# Patient Record
Sex: Female | Born: 1990 | Race: Black or African American | Hispanic: No | Marital: Single | Smoking: Never smoker
Health system: Southern US, Community
[De-identification: ages and names within clinical notes are randomized; demographics above are authoritative.]

## PROBLEM LIST (undated history)

## (undated) DIAGNOSIS — H539 Unspecified visual disturbance: Secondary | ICD-10-CM

## (undated) DIAGNOSIS — A419 Sepsis, unspecified organism: Secondary | ICD-10-CM

## (undated) DIAGNOSIS — R569 Unspecified convulsions: Secondary | ICD-10-CM

## (undated) DIAGNOSIS — G809 Cerebral palsy, unspecified: Secondary | ICD-10-CM

## (undated) HISTORY — DX: Unspecified visual disturbance: H53.9

## (undated) HISTORY — DX: Unspecified convulsions: R56.9

## (undated) HISTORY — PX: REMOVAL OF GASTROSTOMY TUBE: SHX6058

---
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
## 2008-08-17 ENCOUNTER — Ambulatory Visit (HOSPITAL_COMMUNITY): Admission: RE | Admit: 2008-08-17 | Discharge: 2008-08-17 | Payer: Self-pay | Admitting: Pediatrics

## 2008-12-19 ENCOUNTER — Ambulatory Visit: Admission: RE | Admit: 2008-12-19 | Discharge: 2008-12-19 | Payer: Self-pay | Admitting: Diagnostic Radiology

## 2009-10-13 ENCOUNTER — Ambulatory Visit (HOSPITAL_COMMUNITY): Admission: RE | Admit: 2009-10-13 | Discharge: 2009-10-13 | Payer: Self-pay | Admitting: Interventional Radiology

## 2009-12-03 ENCOUNTER — Emergency Department (HOSPITAL_BASED_OUTPATIENT_CLINIC_OR_DEPARTMENT_OTHER): Admission: EM | Admit: 2009-12-03 | Discharge: 2009-12-03 | Payer: Self-pay | Admitting: Emergency Medicine

## 2009-12-03 ENCOUNTER — Ambulatory Visit: Payer: Self-pay | Admitting: Diagnostic Radiology

## 2009-12-07 ENCOUNTER — Emergency Department (HOSPITAL_BASED_OUTPATIENT_CLINIC_OR_DEPARTMENT_OTHER): Admission: EM | Admit: 2009-12-07 | Discharge: 2009-12-07 | Payer: Self-pay | Admitting: Emergency Medicine

## 2009-12-07 ENCOUNTER — Ambulatory Visit: Payer: Self-pay | Admitting: Radiology

## 2010-07-19 ENCOUNTER — Other Ambulatory Visit (HOSPITAL_COMMUNITY): Payer: Self-pay | Admitting: Emergency Medicine

## 2010-07-19 ENCOUNTER — Ambulatory Visit (HOSPITAL_COMMUNITY)
Admission: RE | Admit: 2010-07-19 | Discharge: 2010-07-19 | Disposition: A | Discharge status: Home / Self Care | Payer: Managed Care, Other (non HMO) | Source: Ambulatory Visit | Attending: Emergency Medicine | Admitting: Emergency Medicine

## 2010-07-19 DIAGNOSIS — Z438 Encounter for attention to other artificial openings: Secondary | ICD-10-CM | POA: Insufficient documentation

## 2010-07-19 DIAGNOSIS — Z931 Gastrostomy status: Secondary | ICD-10-CM

## 2010-07-19 MED ORDER — IOHEXOL 300 MG/ML  SOLN
50.0000 mL | Freq: Once | INTRAMUSCULAR | Status: AC | PRN
  Administered 2010-07-19: 10 mL via INTRAVENOUS

## 2010-08-03 NOTE — Op Note (Signed)
[place]. [facility]  Patient:    DAWT REEB Visit Number: [id number] MRN: [id number]          Service Type: END Location: ENDO Attending Physician:  Fayette Pho Damodar Proc. Date: 07/03/98 Admit Date:  07/03/1998                             Operative Report  PREOPERATIVE DIAGNOSIS:  Nonfunctioning gastrostomy button.  POSTOPERATIVE DIAGNOSIS:  Nonfunctioning gastrostomy button.  OPERATION PERFORMED: 1. Removal of nonfunctioning gastrostomy button. 2. Placement of new Bard #18 French 3.4 cm stem button.  SURGEON:  Prabhakar D. Levie Heritage, M.D.  ASSISTANT:  Nurse.  ANESTHESIA:  Nurse.  DESCRIPTION OF PROCEDURE:  Under satisfactory chloral hydrate sedation and topical EMLA cream anesthesia, the gastrostomy site was thoroughly prepped and draped in the usual manner.  The previously placed nonfunctioning gastrostomy button was removed by manipulation.  A new Bard #18, 3.4 cm stem button was placed by appropriate maneuvers.  The button was irrigated with 50 cc of saline.  There was no unusual leakage noted.  The gastrostomy site was cleansed dressed, appropriate instructions were given to the parent regarding the care of the button.  The patient was discharged to be followed as an outpatient. Attending Physician:  Carlos Levering DD:  07/03/98 TD:  07/04/98 Job: [id number]

## 2010-08-03 NOTE — Discharge Summary (Signed)
[place]. [facility]  Patient:    Amber Curry, Amber Curry Visit Number: [id number] MRN: [id number]          Service Type: PED Location: PEDS [phone] Attending Physician:  Edmonia Lynch Dictated by:   Vear Clock, M.D. Admit Date:  12/10/2000 Discharge Date: 12/15/2000                             Discharge Summary  DISCHARGE DIAGNOSES: 1. Episodes of respiratory distress secondary to poor pulmonary function. 2. Severe spastic quadriplegia cerebral palsy. 3. Severe mental retardation. 4. Cortical blindness. 5. Seizure disorder.  DISCHARGE MEDICATIONS: 1. Phenobarbital 100 mg p.o. q.h.s. 2. Tegretol as per home dose. 3. Rondec-DM 5 cc per G-tube q.6h. p.r.n. cough and congestion.  FOLLOWUP:  The patient was to follow up with Dr. Dario Guardian on approximately October 2 or 3, family is to call to set up an appointment.  PROCEDURES/DIAGNOSTIC STUDIES:  None.  CONSULTS:  Neurologic by Dr. Sharene Skeans.  PLAN:  Recommend phenobarbital and carbamazepine levels to be obtained for further evaluation.  ADMISSION HISTORY AND PHYSICAL:  This is one of multiple admissions for this patient, a 20 year old black female admitted September 25 for respiratory difficulties, characterized by cough, low-grade fever, vomiting, diarrhea and suspected viral illness.  The patient was treated with two doses of IM Rocephin and subsequently Augmentin in the hospital.  HOSPITAL COURSE:  #1 - RESPIRATORY DISTRESS:  The patient has been maintained CO monitor and was given Augmentin for her viral URI as well as Rondec and Baclofen to decrease secretions.  #2 - NEUROLOGIC:  Evaluated patient for recheck of her antiepileptics, levels were within normal limits.  #3 - GYNECOLOGIC:  The patient was having painful menses, was due for her Depo shot on the day prior to discharge and a gynecologic exam with Dr. Farrel Gobble at that time.  Mom notes that menses has triggered seizures in  the past.  Mother encouraged to discuss this further with Dr. Sharene Skeans and Dr. Farrel Gobble.  At the end, the patient was continued intermittently on PediaSure versus her usual tube feeds and eventually was able to progress to regular tube feeds again. Mother also encouraged to give patient free water boluses.  LABORATORY DATA:  A CBC on September 25 shows WBC 10.9, hemoglobin 16.4, hematocrit 48.5, platelets 363.  Chemistries on September 25 show sodium 138, potassium 3.8, chloride 104, CO2 25, glucose 85, BUN 4, creatinine 0.5, calcium 8.7, total protein 7.9, albumin 3.8, AST 55, ALT 38, alkaline phosphatase 153, total bili 1.2.  Hemoglobin and carbamazepine levels on admission and on September 30, 21.7 and 20.4 for phenobarb and 9.3 and 9.6 for carbamazepine.  The patient began to improve and is subsequently discharged without further incident.  Follow as previously noted. Dictated by:   Vear Clock, M.D. Attending Physician:  Edmonia Lynch DD:  12/29/00 TD:  12/29/00 Job: [id number]

## 2010-08-03 NOTE — Consult Note (Signed)
[place]. [facility]  Patient:    [name], [name] Visit Number: [id number] MRN: [id number]          Service Type: PED Location: PEDS [phone] Attending Physician:  Edmonia Lynch Dictated by:   Genene Churn. Love, M.D. Admit Date:  12/10/2000                            Consultation Report  REFERRING PHYSICIAN:  Duard Brady, M.D.  PATIENT ADDRESS:  [address], [place], [place], [address].  DATE OF BIRTH:  02/07/1991  REASON FOR CONSULTATION:  This is one of multiple admissions for Amber Curry, a 20 year old black female admitted December 10, 2000 for respiratory difficulties characterized by cough, low-grade fever, vomiting, diarrhea, and suspected viral illness.  This was initially treated with two doses of IM Rocephin and subsequently Augmentin in the hospital.  I am asked to see her for evaluation of recurrent seizures.  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 She has had bilateral hip dislocation repair by Dr. Waylan Boga.  CT scans of the brain have shown generalized atrophy and shunted hydrocephalus.  EEG has shown diffuse background slowing and three independent seizure foci without electrographic seizures present.  She has been on phenobarbital 60 mg q.p.m., Tegretol 100 mg one-half teaspoonful in the morning and one-half at noon and two teaspoonfuls at night time, and Depo-Provera every three months. She has been on Baclofen 10 mg one-half in the morning, one-half at midday, and one at nighttime.  She has had frequent seizures according to her mom; at school it has been reported that she has had 10 seizures in the last 10 days. She has least four seizures per month, or at least one per week according to her mom.  These are characterized by abduction of her arms and a blank stare and trembling  movements of her arms, usually lasting anywhere from 30 to 60 seconds.  She was admitted on December 10, 2000 for respiratory distress, placed on Robitussin-DM, albuterol nebulizer, Neo-Synephrine nasal spray 0.25%, Neosporin, Augmentin, and had been on Rocephin and the other medicines as listed above as an outpatient.  She had witnessed seizure x 3 on December 14, 2000 and one early in the morning December 15, 2000 - each with arm abduction at the shoulders and trembling, lasting 30 to 60 seconds.  Levels of phenobarbital 21.7 on September 25 and carbamazepine 9.3 on September 25.  PHYSICAL EXAMINATION:  GENERAL:  Revealed a poorly-developed black female.  VITAL SIGNS:  With a large adult pressure cuff, blood pressure in the right and left arm of 80/60 with a heart rate of 104.  Head circumference was 47.5 cm.  Temperature was 98.5 axillary.  O2 saturations were 100% on room air.  NEUROLOGIC:  Mental status revealed she could not see, she could not speak, she could not follow any commands.  She lay in bed with spastic quadriplegia. She would open her eyes.  She had a film over her right eye.  She did not blink to scare on the left.  The left pupil was reactive from 3 to 2.5.  The left disk was seen and flat.  The right disk was not seen and the right pupil was not well seen.  She had full extraocular movements with roving eye movements.  She had increased tone in  the upper and lower extremities.  She had gag reflexes.  She had clonus at both ankles and both hands, increased deep tendon reflexes, plantars with toes that were flexed, and wrists that were extended.  She did move all extremities.  IMPRESSION: 1. Minor motor seizure and generalized grand mal seizure, code 345.10. 2. Myoclonus, code 333.2. 3. Spastic quadriplegia, code 343.2. 4. Mental retardation, code 318.1. 5. 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 6. Cortical blindness, code 377.75.  PLAN:  The plan at this time is to  recommend phenobarbital and carbamazepine levels to be obtained for further evaluation.Dictated by:   Genene Churn. Love, M.D.  Attending Physician:  Edmonia Lynch DD:  12/15/00 TD:  12/15/00 Job: [id number]

## 2011-05-20 ENCOUNTER — Other Ambulatory Visit (HOSPITAL_COMMUNITY): Payer: Self-pay | Admitting: Diagnostic Radiology

## 2011-05-20 ENCOUNTER — Ambulatory Visit (HOSPITAL_COMMUNITY)
Admission: RE | Admit: 2011-05-20 | Discharge: 2011-05-20 | Disposition: A | Discharge status: Home / Self Care | Payer: Managed Care, Other (non HMO) | Source: Ambulatory Visit | Attending: Diagnostic Radiology | Admitting: Diagnostic Radiology

## 2011-05-20 DIAGNOSIS — G809 Cerebral palsy, unspecified: Secondary | ICD-10-CM

## 2011-05-20 DIAGNOSIS — Z434 Encounter for attention to other artificial openings of digestive tract: Secondary | ICD-10-CM | POA: Insufficient documentation

## 2011-05-20 MED ORDER — IOHEXOL 300 MG/ML  SOLN
50.0000 mL | Freq: Once | INTRAMUSCULAR | Status: AC | PRN
  Administered 2011-05-20: 20 mL

## 2011-05-20 NOTE — Procedures (Signed)
Successful exchange of 24 [place] J-tube.  No immediate complication.

## 2011-05-21 ENCOUNTER — Telehealth (HOSPITAL_COMMUNITY): Payer: Self-pay | Admitting: Radiology

## 2011-05-23 ENCOUNTER — Telehealth (HOSPITAL_COMMUNITY): Payer: Self-pay | Admitting: *Deleted

## 2012-03-31 ENCOUNTER — Other Ambulatory Visit (HOSPITAL_COMMUNITY): Payer: Self-pay | Admitting: Interventional Radiology

## 2012-03-31 DIAGNOSIS — F79 Unspecified intellectual disabilities: Secondary | ICD-10-CM

## 2012-04-01 ENCOUNTER — Ambulatory Visit (HOSPITAL_COMMUNITY)
Admission: RE | Admit: 2012-04-01 | Discharge: 2012-04-01 | Disposition: A | Discharge status: Home / Self Care | Payer: Managed Care, Other (non HMO) | Source: Ambulatory Visit | Attending: Interventional Radiology | Admitting: Interventional Radiology

## 2012-04-01 DIAGNOSIS — F79 Unspecified intellectual disabilities: Secondary | ICD-10-CM | POA: Insufficient documentation

## 2012-04-01 DIAGNOSIS — K9413 Enterostomy malfunction: Secondary | ICD-10-CM | POA: Insufficient documentation

## 2012-04-01 DIAGNOSIS — K9403 Colostomy malfunction: Secondary | ICD-10-CM | POA: Insufficient documentation

## 2012-04-01 MED ORDER — IOHEXOL 300 MG/ML  SOLN
50.0000 mL | Freq: Once | INTRAMUSCULAR | Status: AC | PRN
  Administered 2012-04-01: 1 mL

## 2012-04-01 NOTE — Procedures (Signed)
Successful exchange of 24 Fr GJ tube.  No immediate complication.

## 2012-07-17 ENCOUNTER — Other Ambulatory Visit: Payer: Self-pay

## 2012-07-17 DIAGNOSIS — G40319 Generalized idiopathic epilepsy and epileptic syndromes, intractable, without status epilepticus: Secondary | ICD-10-CM

## 2012-07-17 MED ORDER — PHENOBARBITAL 20 MG/5ML PO ELIX: ORAL_SOLUTION | ORAL | Status: DC

## 2012-09-07 ENCOUNTER — Ambulatory Visit (HOSPITAL_COMMUNITY)
Admission: RE | Admit: 2012-09-07 | Discharge: 2012-09-07 | Disposition: A | Discharge status: Home / Self Care | Payer: Managed Care, Other (non HMO) | Source: Ambulatory Visit | Attending: Interventional Radiology | Admitting: Interventional Radiology

## 2012-09-07 ENCOUNTER — Other Ambulatory Visit (HOSPITAL_COMMUNITY): Payer: Self-pay | Admitting: Interventional Radiology

## 2012-09-07 DIAGNOSIS — K9413 Enterostomy malfunction: Secondary | ICD-10-CM | POA: Insufficient documentation

## 2012-09-07 DIAGNOSIS — G8250 Quadriplegia, unspecified: Secondary | ICD-10-CM

## 2012-09-07 DIAGNOSIS — Y833 Surgical operation with formation of external stoma as the cause of abnormal reaction of the patient, or of later complication, without mention of misadventure at the time of the procedure: Secondary | ICD-10-CM | POA: Insufficient documentation

## 2012-09-07 DIAGNOSIS — K9403 Colostomy malfunction: Secondary | ICD-10-CM | POA: Insufficient documentation

## 2012-09-07 DIAGNOSIS — E441 Mild protein-calorie malnutrition: Secondary | ICD-10-CM | POA: Insufficient documentation

## 2012-09-07 MED ORDER — IOHEXOL 300 MG/ML  SOLN
50.0000 mL | Freq: Once | INTRAMUSCULAR | Status: AC | PRN
  Administered 2012-09-07: 15 mL via INTRAVENOUS

## 2012-09-07 NOTE — Procedures (Signed)
Successful 72fr J TUBE EXCHG NO COMP STABLE READY FOR USE

## 2012-10-14 ENCOUNTER — Telehealth: Payer: Self-pay | Admitting: *Deleted

## 2012-10-14 DIAGNOSIS — G808 Other cerebral palsy: Secondary | ICD-10-CM

## 2012-10-14 MED ORDER — AMBULATORY NON FORMULARY MEDICATION: Status: DC

## 2012-10-14 NOTE — Telephone Encounter (Signed)
Signed prescription is on your desk

## 2013-01-24 ENCOUNTER — Other Ambulatory Visit: Payer: Self-pay | Admitting: Family

## 2013-01-24 DIAGNOSIS — G40319 Generalized idiopathic epilepsy and epileptic syndromes, intractable, without status epilepticus: Secondary | ICD-10-CM

## 2013-01-29 ENCOUNTER — Ambulatory Visit (INDEPENDENT_AMBULATORY_CARE_PROVIDER_SITE_OTHER): Payer: Managed Care, Other (non HMO) | Admitting: Family

## 2013-01-29 ENCOUNTER — Encounter: Payer: Self-pay | Admitting: Family

## 2013-01-29 VITALS — BP 108/78 | HR 84

## 2013-01-29 DIAGNOSIS — G40319 Generalized idiopathic epilepsy and epileptic syndromes, intractable, without status epilepticus: Secondary | ICD-10-CM

## 2013-01-29 DIAGNOSIS — Q02 Microcephaly: Secondary | ICD-10-CM

## 2013-01-29 DIAGNOSIS — F72 Severe intellectual disabilities: Secondary | ICD-10-CM

## 2013-01-29 DIAGNOSIS — H47619 Cortical blindness, unspecified side of brain: Secondary | ICD-10-CM

## 2013-01-29 DIAGNOSIS — G808 Other cerebral palsy: Secondary | ICD-10-CM

## 2013-01-29 DIAGNOSIS — Z79899 Other long term (current) drug therapy: Secondary | ICD-10-CM

## 2013-01-29 NOTE — Progress Notes (Signed)
Patient: Amber Curry MRN: [id number] Sex: female DOB: 1991-02-09  Provider: Elveria Rising, NP Location of Care: [facility]  Note type: Routine return visit  History of Present Illness: Referral Source: Dr. Rocky Morel History from: escort and [facility] chart Chief Complaint: Epilepsy/Cortical Blindness/Severe Mental Retardation  Amber Curry is a 22 y.o. 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  She has residuals of severe spastic quadriparesis, mixed seizures that are in fair control, severe dysphagia, cortical blindness, and profound mental retardation.    Amber Curry is here today with an escort who says that she believes that she has been physically healthy since her last visit. She is unsure of her seizure frequency. She said that her mother was at work and could not come to the visit today.  Review of Systems: 12 system review was unremarkable  Past Medical History  Diagnosis Date  . Seizures   . 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  She has residuals of severe spastic quadriparesis, mixed seizures that are in fair control, severe dysphagia, cortical blindness, and profound mental retardation.     Surgical History Past Surgical History  Procedure Laterality Date  . Removal of gastrostomy tube     Family History  Family History is negative for migraines, seizures, cognitive impairment, blindness, deafness, birth defects, chromosomal disorder, autism.  Social History History    Social History  . Marital Status: Single    Spouse Name: N/A    Number of Children: N/A  . Years of Education: N/A   Social History Main Topics  . Smoking status: Never Smoker   . Smokeless tobacco: Never Used  . Alcohol Use: No  . Drug Use: No  . Sexual Activity: No   Other Topics Concern  . None   Social History Narrative  . 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  No Known Allergies  Physical Exam BP 108/78  Pulse 84 General: small microcephalic female, seated in wheelchair, in no evident distress Head: head  microcephalic and atraumatic.   Ears, Nose and Throat: unable to fully examine. tongue protruding. unable to examine oropharynx Neck: supple with no carotid or supraclavicular bruits. Respiratory: lungs clear to auscultation Cardiovascular: regular rate and rhythm, no murmurs Musculoskeletal: Bilateral spastic quadriparesis Skin: no rashes or lesions Trunk: g-tube intact and clamped  Neurologic Exam  Mental Status: Profound mental retardation. Does not open eyes or pay attention to the examiner. Has no language. does not obey any commands.  Cranial Nerves: Sluggish nonreactive pupils. Unable to visualize either fundus. Dysconjugate eye movements.  No reactions to visual stimuli. Bilateral facial weakness with drooling.  Midline tongue. She startles to sudden sounds. She has an occasional random smile that is symmetric. She blinks to threat.  Motor: Bilateral spastic quadriparesis. Unable to assess strength. Resists movements of the extremities by the examiner. 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  She has residuals of severe spastic quadriparesis, mixed seizures that are in fair control, severe dysphagia, cortical blindness, and profound mental retardation.  She is here today with an escort who can contribute little to her interval history. I asked her to have her mother call me if she has any concerns. [name]'s examination is unchanged. She will continue her medications for now. I will see her back in follow up in 1 year or sooner if needed.

## 2013-01-31 ENCOUNTER — Encounter: Payer: Self-pay | Admitting: Family

## 2013-01-31 DIAGNOSIS — G40319 Generalized idiopathic epilepsy and epileptic syndromes, intractable, without status epilepticus: Secondary | ICD-10-CM | POA: Insufficient documentation

## 2013-01-31 DIAGNOSIS — Q02 Microcephaly: Secondary | ICD-10-CM | POA: Insufficient documentation

## 2013-01-31 DIAGNOSIS — H47619 Cortical blindness, unspecified side of brain: Secondary | ICD-10-CM | POA: Insufficient documentation

## 2013-01-31 DIAGNOSIS — F72 Severe intellectual disabilities: Secondary | ICD-10-CM | POA: Insufficient documentation

## 2013-01-31 DIAGNOSIS — Z79899 Other long term (current) drug therapy: Secondary | ICD-10-CM | POA: Insufficient documentation

## 2013-01-31 DIAGNOSIS — G808 Other cerebral palsy: Secondary | ICD-10-CM | POA: Insufficient documentation

## 2013-01-31 NOTE — Patient Instructions (Signed)
Continue [name]'s medications without change for now.  Have [name] call me if she has questions or concerns.  Please plan to have her return for follow up in 1 year or sooner if needed.

## 2013-02-06 ENCOUNTER — Other Ambulatory Visit: Payer: Self-pay | Admitting: Family

## 2013-02-24 ENCOUNTER — Other Ambulatory Visit: Payer: Self-pay | Admitting: Family

## 2013-03-12 ENCOUNTER — Other Ambulatory Visit (HOSPITAL_COMMUNITY): Payer: Self-pay | Admitting: Interventional Radiology

## 2013-03-12 ENCOUNTER — Ambulatory Visit (HOSPITAL_COMMUNITY)
Admission: RE | Admit: 2013-03-12 | Discharge: 2013-03-12 | Disposition: A | Discharge status: Home / Self Care | Payer: Managed Care, Other (non HMO) | Source: Ambulatory Visit | Attending: Interventional Radiology | Admitting: Interventional Radiology

## 2013-03-12 DIAGNOSIS — K9413 Enterostomy malfunction: Secondary | ICD-10-CM | POA: Insufficient documentation

## 2013-03-12 DIAGNOSIS — G40909 Epilepsy, unspecified, not intractable, without status epilepticus: Secondary | ICD-10-CM

## 2013-03-12 DIAGNOSIS — K9403 Colostomy malfunction: Secondary | ICD-10-CM | POA: Insufficient documentation

## 2013-03-12 MED ORDER — IOHEXOL 300 MG/ML  SOLN
50.0000 mL | Freq: Once | INTRAMUSCULAR | Status: AC | PRN
  Administered 2013-03-12: 20 mL

## 2013-03-12 NOTE — Procedures (Signed)
24 Fr. J tube exchange No comp

## 2013-03-15 ENCOUNTER — Other Ambulatory Visit (HOSPITAL_COMMUNITY): Payer: Self-pay | Admitting: Interventional Radiology

## 2013-03-15 DIAGNOSIS — G40909 Epilepsy, unspecified, not intractable, without status epilepticus: Secondary | ICD-10-CM

## 2013-04-02 ENCOUNTER — Other Ambulatory Visit: Payer: Self-pay

## 2013-04-02 DIAGNOSIS — G808 Other cerebral palsy: Secondary | ICD-10-CM

## 2013-04-02 MED ORDER — AMBULATORY NON FORMULARY MEDICATION: Status: DC

## 2013-07-05 ENCOUNTER — Other Ambulatory Visit: Payer: Self-pay | Admitting: Family

## 2013-07-05 MED ORDER — TEGRETOL 100 MG/5ML PO SUSP: ORAL | Status: DC

## 2013-07-05 NOTE — Telephone Encounter (Signed)
Previous Rx did not print. TG 

## 2013-08-01 ENCOUNTER — Other Ambulatory Visit: Payer: Self-pay | Admitting: Family

## 2013-08-30 ENCOUNTER — Other Ambulatory Visit: Payer: Self-pay | Admitting: Family

## 2013-09-23 ENCOUNTER — Other Ambulatory Visit: Payer: Self-pay

## 2013-09-23 DIAGNOSIS — G808 Other cerebral palsy: Secondary | ICD-10-CM

## 2013-09-23 MED ORDER — AMBULATORY NON FORMULARY MEDICATION: Status: DC

## 2013-11-10 ENCOUNTER — Ambulatory Visit (HOSPITAL_COMMUNITY)
Admission: RE | Admit: 2013-11-10 | Discharge: 2013-11-10 | Disposition: A | Discharge status: Home / Self Care | Payer: Managed Care, Other (non HMO) | Source: Ambulatory Visit | Attending: Interventional Radiology | Admitting: Interventional Radiology

## 2013-11-10 ENCOUNTER — Other Ambulatory Visit (HOSPITAL_COMMUNITY): Payer: Self-pay | Admitting: Interventional Radiology

## 2013-11-10 DIAGNOSIS — Z4659 Encounter for fitting and adjustment of other gastrointestinal appliance and device: Secondary | ICD-10-CM | POA: Insufficient documentation

## 2013-11-10 DIAGNOSIS — G808 Other cerebral palsy: Secondary | ICD-10-CM | POA: Insufficient documentation

## 2013-11-10 DIAGNOSIS — G931 Anoxic brain damage, not elsewhere classified: Secondary | ICD-10-CM

## 2013-11-10 MED ORDER — IOHEXOL 300 MG/ML  SOLN
50.0000 mL | Freq: Once | INTRAMUSCULAR | Status: AC | PRN
  Administered 2013-11-10: 20 mL

## 2013-11-10 MED ORDER — LIDOCAINE VISCOUS 2 % MT SOLN
OROMUCOSAL | Status: AC
  Filled 2013-11-10: qty 15

## 2013-11-10 NOTE — Procedures (Signed)
Exchange 86f J tube under fluoro No complication No blood loss. See complete dictation in [facility].

## 2014-01-28 ENCOUNTER — Other Ambulatory Visit: Payer: Self-pay | Admitting: Family

## 2014-02-01 ENCOUNTER — Ambulatory Visit: Payer: Managed Care, Other (non HMO) | Attending: Internal Medicine | Admitting: Physical Therapy

## 2014-02-01 DIAGNOSIS — G809 Cerebral palsy, unspecified: Secondary | ICD-10-CM | POA: Diagnosis not present

## 2014-02-01 DIAGNOSIS — G800 Spastic quadriplegic cerebral palsy: Secondary | ICD-10-CM | POA: Diagnosis not present

## 2014-02-01 DIAGNOSIS — R293 Abnormal posture: Secondary | ICD-10-CM

## 2014-02-01 NOTE — Therapy (Signed)
  Patient Details  Name: Angela NevinConella S Faughnan MRN: [id number] Date of Birth: 06/18/1990  Encounter Date: 02/01/2014  See scanned assessment for wheelchair evaluation performed on this date of service.  Denorris Reust W. 02/01/2014, 12:23 PM

## 2014-02-07 ENCOUNTER — Encounter: Payer: Self-pay | Admitting: Family

## 2014-02-07 ENCOUNTER — Ambulatory Visit (INDEPENDENT_AMBULATORY_CARE_PROVIDER_SITE_OTHER): Payer: Managed Care, Other (non HMO) | Admitting: Family

## 2014-02-07 ENCOUNTER — Telehealth: Payer: Self-pay

## 2014-02-07 VITALS — BP 108/74 | HR 80

## 2014-02-07 DIAGNOSIS — H47619 Cortical blindness, unspecified side of brain: Secondary | ICD-10-CM

## 2014-02-07 DIAGNOSIS — F72 Severe intellectual disabilities: Secondary | ICD-10-CM

## 2014-02-07 DIAGNOSIS — G40319 Generalized idiopathic epilepsy and epileptic syndromes, intractable, without status epilepticus: Secondary | ICD-10-CM

## 2014-02-07 DIAGNOSIS — G40311 Generalized idiopathic epilepsy and epileptic syndromes, intractable, with status epilepticus: Secondary | ICD-10-CM

## 2014-02-07 DIAGNOSIS — K117 Disturbances of salivary secretion: Secondary | ICD-10-CM

## 2014-02-07 DIAGNOSIS — Z79899 Other long term (current) drug therapy: Secondary | ICD-10-CM

## 2014-02-07 DIAGNOSIS — G808 Other cerebral palsy: Secondary | ICD-10-CM

## 2014-02-07 DIAGNOSIS — Q02 Microcephaly: Secondary | ICD-10-CM

## 2014-02-07 MED ORDER — AMBULATORY NON FORMULARY MEDICATION: Status: DC

## 2014-02-07 MED ORDER — PHENOBARBITAL 20 MG/5ML PO ELIX: ORAL_SOLUTION | ORAL | Status: DC

## 2014-02-07 MED ORDER — TEGRETOL 100 MG/5ML PO SUSP: ORAL | Status: DC

## 2014-02-07 NOTE — Telephone Encounter (Signed)
I received same message. The caregiver with patient today wrote [facility] [address] on her paperwork, which is why I sent it there. She has used [facility] [place] before - they do compounding so Baclofen suspension and new Rx for Glycopyrrolate (for drooling) could be sent there. Would you call her mother Kelton PillarConella Dippolito at [phone] to verify pharmacies? Thanks [facility]

## 2014-02-07 NOTE — Patient Instructions (Signed)
I have given Amber Curry a prescription for Gyclopyrrolate suspension for drooling. This is to be given as follows: 0.43ml every 8 hours. It is best to give it an hour before meals or 2 hours after meals. This dose may have to be adjusted but we can start with this amount and see how she tolerates it. Let me know if it works or if she has any problems with it.   Please plan to return for follow up in 1 year or sooner if needed.

## 2014-02-07 NOTE — Progress Notes (Signed)
Patient: Amber Curry MRN: [id number] Sex: female DOB: 06/10/1990  Provider: Elveria RisingGOODPASTURE, Dovey Fatzinger, NP Location of Care: [facility]  Note type: Routine return visit  History of Present Illness: Referral Source: Dr. Rocky Morelobert Rostand History from: caregiver Chief Complaint: Epilepsy  Amber Curry is a 23 y.o. 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 She has residuals of severe spastic quadriparesis, mixed seizures that are in fair control, severe dysphagia, cortical blindness, and profound mental retardation.Amber Curry was last seen January 29, 2013.   Amber Curry is here today with a caregiver who says that she that she has been physically healthy since her last visit. She says that her seizure history is unchanged, and that she has 1-2 brief seizures per day. Amber Curry receives overnight tube feedings and her caregiver says that she tolerates these well. She said that her mother was at work and could not come to the visit today, but that her only question was if she could receive something for excessive drooling. She said that Scopalamine patches were tried but that she had skin irritation and they had to be stopped.   Review of Systems: 12 system review was unremarkable  Past Medical History  Diagnosis Date  . Seizures   . Vision abnormalities    Hospitalizations: No., Head Injury: No., Nervous System Infections: No., Immunizations up to date: Yes.   Past Medical History Comments: see Hx.  Surgical History Past Surgical History  Procedure Laterality Date  . Removal of gastrostomy tube      Family History family history is not on file. Family History is otherwise negative for migraines, seizures, cognitive impairment, blindness, deafness, birth defects, chromosomal disorder, autism.  Social  History History   Social History  . Marital Status: Single    Spouse Name: N/A    Number of Children: N/A  . Years of Education: N/A   Social History Main Topics  . Smoking status: Never Smoker   . Smokeless tobacco: Never Used  . Alcohol Use: No  . Drug Use: No  . Sexual Activity: No   Other Topics Concern  . None   Social History Narrative   Educational level: special education School Attending: Living with:  mother  Hobbies/Interest: none School comments:  [name] is being cared for at home.  Physical Exam BP 108/74 mmHg  Pulse 80 General: small microcephalic female, seated in wheelchair, in no evident distress Head: head microcephalic and atraumatic.  Ears, Nose and Throat: unable to fully examine. tongue protruding. unable to examine oropharynx Neck: supple with no carotid or supraclavicular bruits. Respiratory: lungs clear to auscultation Cardiovascular: regular rate and rhythm, no murmurs Musculoskeletal: Bilateral spastic quadriparesis Skin: no rashes or lesions Trunk: g-tube intact and clamped  Neurologic Exam  Mental Status: Profound mental retardation. Does not open eyes or pay attention to the examiner. Has no language. does not obey any commands.  Cranial Nerves: Sluggish nonreactive pupils. Unable to visualize either fundus. Dysconjugate eye movements. No reactions to visual stimuli. Bilateral facial weakness with drooling. Midline tongue. She startles to sudden sounds. She has an occasional random smile that is symmetric. She blinks to threat.  Motor: Bilateral spastic quadriparesis. Unable to assess strength. Resists movements of the extremities by the examiner. 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 She has residuals of severe spastic quadriparesis, mixed seizures that are in fair control, severe dysphagia, cortical blindness, and profound mental retardation. She is here today with a caregiver who says that Mom's question for this visit was to know if Amber Curry could try a medication for excessive drooling. She was tried on Scopalamine patch but had skin irritation. I gave her a prescription for Glycopyrrolate and explained that we will start low dose to see if Amber Curry tolerates it. We may need to adjust the dose if she tolerates it but the drooling continues. I asked her to call or have Mom call and let me know how Amber Curry does with the medication. I will otherwise see her back in follow up in 1 year or sooner if needed.

## 2014-02-07 NOTE — Telephone Encounter (Signed)
[facility] [address] called and stated that Rx for Baclofen and Glycopyrrolate were faxed to their pharmacy. These are compound drugs and can not be processed there. They can be [facility] or [facility]. The pharmacy also said that pt does not use their location, they use [facility] [address]. Wanted to know if pt was going to be transferring to their location.  Inetta Fermoina, who do I call to get this information?

## 2014-02-08 MED ORDER — PHENOBARBITAL 20 MG/5ML PO ELIX: ORAL_SOLUTION | ORAL | Status: DC

## 2014-02-08 MED ORDER — AMBULATORY NON FORMULARY MEDICATION: Status: DC

## 2014-02-08 MED ORDER — TEGRETOL 100 MG/5ML PO SUSP: ORAL | Status: DC

## 2014-02-08 NOTE — Telephone Encounter (Signed)
To minimize confusion at the pharmacies - I reprinted the Rx's and sent [facility] and [facility]bital to [facility] [address], and Baclofen and Glycopyrrolate to [facility]. I cancelled Rx's at [facility] [address]. TG

## 2014-02-08 NOTE — Telephone Encounter (Signed)
I called mom and she said that [facility] uses [facility] [address]. For compounds, she uses [facility] [place]. I have updated the information.

## 2014-02-17 ENCOUNTER — Telehealth: Payer: Self-pay | Admitting: *Deleted

## 2014-02-17 DIAGNOSIS — G808 Other cerebral palsy: Secondary | ICD-10-CM

## 2014-02-17 NOTE — Telephone Encounter (Signed)
I left a message and asked Mom to call back. TG 

## 2014-02-17 NOTE — Telephone Encounter (Signed)
Gayle, mom, stated that she would like to increase the pt's baclofen. The mother said the pt is a little stiff in the hip area. Can it be increased? The mother can be reached at [phone].

## 2014-02-18 MED ORDER — AMBULATORY NON FORMULARY MEDICATION: Status: DC

## 2014-02-18 NOTE — Telephone Encounter (Signed)
I reviewed your note and agree with this plan, thank you. 

## 2014-02-18 NOTE — Telephone Encounter (Signed)
Amber Curry called back and said that [facility] reported that she has more spasticity, especially in her hips and legs. The caregivers had noted this when bathing her and doing personal care, diaper changes etc. I recommended that we increase the night time Baclofen dose, as the increase in spasticity was noted in the mornings. She is currently taking Baclofen 2mg /ml - 5ml in the morning, 7.485ml at midday and 2.165ml at bedtime. I instructed Amber Curry to increase to 5ml at bedtime to see if that would improve the morning spasticity. I will send in a new Rx for this dose increase. Amber Curry agreed with this plan. TG

## 2014-04-22 ENCOUNTER — Other Ambulatory Visit: Payer: Self-pay | Admitting: Family

## 2014-04-22 DIAGNOSIS — G808 Other cerebral palsy: Secondary | ICD-10-CM

## 2014-04-22 MED ORDER — AMBULATORY NON FORMULARY MEDICATION: Status: DC

## 2014-06-08 ENCOUNTER — Ambulatory Visit (HOSPITAL_COMMUNITY)
Admission: RE | Admit: 2014-06-08 | Discharge: 2014-06-08 | Disposition: A | Discharge status: Home / Self Care | Payer: Medicaid Other | Source: Ambulatory Visit | Attending: Diagnostic Radiology | Admitting: Diagnostic Radiology

## 2014-06-08 ENCOUNTER — Other Ambulatory Visit (HOSPITAL_COMMUNITY): Payer: Self-pay | Admitting: Diagnostic Radiology

## 2014-06-08 DIAGNOSIS — G808 Other cerebral palsy: Secondary | ICD-10-CM | POA: Diagnosis not present

## 2014-06-08 DIAGNOSIS — K9413 Enterostomy malfunction: Secondary | ICD-10-CM | POA: Insufficient documentation

## 2014-06-08 MED ORDER — IOHEXOL 300 MG/ML  SOLN
50.0000 mL | Freq: Once | INTRAMUSCULAR | Status: AC | PRN
  Administered 2014-06-08: 10 mL

## 2014-06-08 MED ORDER — LIDOCAINE VISCOUS 2 % MT SOLN
OROMUCOSAL | Status: AC
Start: 2014-06-08 — End: 2014-06-08
  Filled 2014-06-08: qty 15

## 2014-06-08 NOTE — Procedures (Signed)
Replacement of GJ feeding tube (24 Fr).  No immediate complication.  No blood loss.

## 2014-08-08 ENCOUNTER — Other Ambulatory Visit: Payer: Self-pay | Admitting: Family

## 2014-09-05 ENCOUNTER — Other Ambulatory Visit: Payer: Self-pay | Admitting: Family

## 2014-09-20 ENCOUNTER — Other Ambulatory Visit: Payer: Self-pay | Admitting: Family

## 2014-09-20 DIAGNOSIS — K117 Disturbances of salivary secretion: Secondary | ICD-10-CM

## 2014-09-20 DIAGNOSIS — G808 Other cerebral palsy: Secondary | ICD-10-CM

## 2014-09-20 DIAGNOSIS — F72 Severe intellectual disabilities: Secondary | ICD-10-CM

## 2014-09-20 MED ORDER — AMBULATORY NON FORMULARY MEDICATION: Status: DC

## 2014-10-20 ENCOUNTER — Other Ambulatory Visit: Payer: Self-pay

## 2014-10-20 DIAGNOSIS — G808 Other cerebral palsy: Secondary | ICD-10-CM

## 2014-10-20 MED ORDER — AMBULATORY NON FORMULARY MEDICATION: Status: DC

## 2014-10-26 ENCOUNTER — Telehealth: Payer: Self-pay | Admitting: Family

## 2014-10-26 MED ORDER — TEGRETOL 100 MG/5ML PO SUSP: ORAL | Status: DC

## 2014-10-26 NOTE — Telephone Encounter (Signed)
Mom Amber Curry left message saying that Amber Curry was having increase in seizure activity in the early morning. I left a message for Mom and she called me back. She said that the early morning seizures were occurring very frequently and had been going on for about a month. Mom thought it might be because her Depo-Provera injection is past due. She said that Amber Curry had been otherwise well and had not missed any doses of medication. I recommended that she increase the Tegretol dose to 11cc AM, 11cc midday and 12 cc at bedtime. Obtaining lab studies is extremely difficult but we may need to try obtain Carbamazepine and Phenobarbital levels if her seizures continue. I talked with Mom about the hormones and told her that while some women have increased seizures around the time of their menstrual cycle that it would be difficult to say if Amber Curry is having increased seizures because the Depo-Provera hasn't been given on the due date. Mom agreed with this and will let me know if the seizures continue. TG

## 2014-10-31 NOTE — Telephone Encounter (Signed)
I reviewed your note in agree with this plan.  We may have to consider some other medication if seizures continue.  I wonder about performing an EEG on her.  I also would like to have mother video some of the behaviors to make certain that we are indeed seeing seizures.

## 2014-12-19 ENCOUNTER — Other Ambulatory Visit (HOSPITAL_COMMUNITY): Payer: Self-pay | Admitting: Interventional Radiology

## 2014-12-19 DIAGNOSIS — K9423 Gastrostomy malfunction: Secondary | ICD-10-CM

## 2014-12-20 ENCOUNTER — Ambulatory Visit (HOSPITAL_COMMUNITY)
Admission: RE | Admit: 2014-12-20 | Discharge: 2014-12-20 | Disposition: A | Discharge status: Home / Self Care | Payer: 59 | Source: Ambulatory Visit | Attending: Interventional Radiology | Admitting: Interventional Radiology

## 2014-12-20 DIAGNOSIS — K9423 Gastrostomy malfunction: Secondary | ICD-10-CM | POA: Diagnosis present

## 2014-12-20 DIAGNOSIS — G931 Anoxic brain damage, not elsewhere classified: Secondary | ICD-10-CM | POA: Diagnosis not present

## 2014-12-20 MED ORDER — CHLORHEXIDINE GLUCONATE 4 % EX LIQD
CUTANEOUS | Status: AC
  Filled 2014-12-20: qty 15

## 2014-12-20 MED ORDER — IOHEXOL 300 MG/ML  SOLN
50.0000 mL | Freq: Once | INTRAMUSCULAR | Status: DC | PRN
  Administered 2014-12-20: 20 mL via INTRAVENOUS
  Filled 2014-12-20: qty 50

## 2014-12-20 NOTE — Procedures (Signed)
Interventional Radiology Procedure Note  Procedure: fluoro guided exchange of gastro-jejunostomy, with removal of single lumen jejunostomy (fractured), and placement of new 73F single lumen jejunostomy.   Complications: None Recommendations:  - Ok to use   - Routine care   Signed,  Yvone Neu. Loreta Ave, DO

## 2015-02-13 ENCOUNTER — Encounter: Payer: Self-pay | Admitting: Family

## 2015-02-13 ENCOUNTER — Ambulatory Visit (INDEPENDENT_AMBULATORY_CARE_PROVIDER_SITE_OTHER): Payer: Medicaid Other | Admitting: Family

## 2015-02-13 VITALS — BP 110/70 | HR 90

## 2015-02-13 DIAGNOSIS — F72 Severe intellectual disabilities: Secondary | ICD-10-CM

## 2015-02-13 DIAGNOSIS — G40319 Generalized idiopathic epilepsy and epileptic syndromes, intractable, without status epilepticus: Secondary | ICD-10-CM

## 2015-02-13 DIAGNOSIS — Q02 Microcephaly: Secondary | ICD-10-CM | POA: Diagnosis not present

## 2015-02-13 DIAGNOSIS — H47619 Cortical blindness, unspecified side of brain: Secondary | ICD-10-CM

## 2015-02-13 DIAGNOSIS — K117 Disturbances of salivary secretion: Secondary | ICD-10-CM | POA: Diagnosis not present

## 2015-02-13 DIAGNOSIS — G808 Other cerebral palsy: Secondary | ICD-10-CM

## 2015-02-13 MED ORDER — AMBULATORY NON FORMULARY MEDICATION: Status: DC

## 2015-02-13 NOTE — Patient Instructions (Signed)
For [name]'s spasticity (tight muscles) - I have increased the Baclofen dose to the following - 10 ml in the morning, 7.805ml at midday and 5 ml at night. Let me know if this increase makes her sleepy during the day. I have updated her prescription to reflect this change in dose.  Work on doing gentle range of motion exercises to her extremities as you do her daily care.   Continue her other medications as you have been giving them.  Please call me if you have any questions or concerns.   Please plan to return for follow up in 1 year or sooner if needed.

## 2015-02-13 NOTE — Progress Notes (Signed)
Patient: Amber Curry MRN: [id number] Sex: female DOB: 04/08/1990  Provider: Elveria Risingina Kariyah Baugh, NP Location of Care: [facility]  Note type: Routine return visit  History of Present Illness: Referral Source: Rocky Morelobert Rostand, MD [facility] Chief Complaint: Epilepsy/Congential Quadriplegia  Amber Curry is a 24 y.o. 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 Amber Curry has residuals of severe spastic quadriparesis, mixed seizures that are in fair control, severe dysphagia, cortical blindness, and profound mental retardation.Amber ApleyShnee was last seen February 07, 2014.   Amber ApleyShnee is here today with a caregiver who says that she that she has been physically healthy since her last visit. She says that her seizure history is unchanged, and that she has 1-2 brief seizures per day. Amber Curry receives overnight tube feedings and her caregiver says that she tolerates these well. Amber Curry's caregiver noted that she has increased spasticity in her arms and legs, making dressing and hygiene difficult. She said that her mother was at work and could not come to the visit today.   Amber Curry's caregiver has no other health concerns for her today other than previously mentioned.  Review of Systems: Please see the HPI for neurologic and other pertinent review of systems. Otherwise, the following systems are noncontributory including constitutional, eyes, ears, nose and throat, cardiovascular, respiratory, gastrointestinal, genitourinary, musculoskeletal, skin, endocrine, hematologic/lymph, allergic/immunologic and psychiatric.   Past Medical History  Diagnosis Date  . Seizures (HCC)   . Vision abnormalities    Hospitalizations: No., Head Injury: No., Nervous System Infections: No., Immunizations up to date:  Yes.   Past Medical History Comments: See history  Surgical History Past Surgical History  Procedure Laterality Date  . Removal of gastrostomy tube      Family History family history is not on file. Family History is otherwise negative for migraines, seizures, cognitive impairment, blindness, deafness, birth defects, chromosomal disorder, autism.  Social History Social History   Social History  . Marital Status: Single    Spouse Name: N/A  . Number of Children: N/A  . Years of Education: N/A   Social History Main Topics  . Smoking status: Never Smoker   . Smokeless tobacco: Never Used  . Alcohol Use: No  . Drug Use: No  . Sexual Activity: No   Other Topics Concern  . None   Social History Narrative   Fransisca stays at home and receives care from her mother and a home health aid.     Allergies No Known Allergies  Physical Exam BP 110/70 mmHg  Pulse 90 General: small microcephalic female, seated in wheelchair, in no evident distress Head: head microcephalic and atraumatic.  Ears, Nose and Throat: unable to fully examine. tongue protruding. unable to examine oropharynx Neck: supple with no carotid or supraclavicular bruits. Respiratory: lungs clear to auscultation Cardiovascular: regular rate and rhythm, no murmurs Musculoskeletal: Bilateral spastic quadriparesis with contractures Skin: no rashes or lesions Trunk: g-tube intact and clamped  Neurologic Exam  Mental Status: Profound mental retardation. Does not open eyes or pay attention to the examiner. Has no language. does not obey any commands.  Cranial Nerves: Sluggish nonreactive pupils. Unable to visualize either fundus. Dysconjugate eye movements. No reactions to visual stimuli. Bilateral facial weakness with drooling. Midline tongue. She startles to sudden sounds. She has an occasional random smile that is symmetric. She blinks to threat.  Motor: Bilateral spastic  quadriparesis. Unable to assess  strength. Resists movements of the extremities by the examiner. Sensory: Withdrawal x 4 Coordination: No spontaneous movements Gait and Station: Unable to stand or bear weight Reflexes: Absent and symmetric  Impression 1. 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 Microcephalus 4. Severe intellectual delay 5. Cortical blindness  Recommendations for plan of care The patient's previous [facility] records were reviewed.Amber Curry has neither had nor required imaging or lab studies since the last visit. 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 She has residuals of severe spastic quadriparesis, mixed seizures that are in fair control, severe dysphagia, cortical blindness, and severe intellectual delay. She is here today with a caregiver who reports increase in spasticity in Amber Curry's extremities, making dressing and hygiene difficult. I recommended an increase in the Baclofen dose and asked the caregiver to let me know how Amber Curry tolerates the increase and if it is effective. I reminded her of the need for Amber Curry to have gentle range of motion exercises several times per day. I will otherwise see Amber Curry back in follow up in 1 year or sooner if needed.   The medication list was reviewed and reconciled.  I reviewed changes that were made in the prescribed medications today.  A complete medication list was provided to the patient's caregiver.  Dr. Sharene Skeans was consulted regarding the patient.   Total time spent with the patient was 25 minutes, of which 50% or more was spent in counseling and coordination of care.

## 2015-02-18 ENCOUNTER — Other Ambulatory Visit: Payer: Self-pay | Admitting: Family

## 2015-04-13 ENCOUNTER — Other Ambulatory Visit: Payer: Self-pay | Admitting: Family

## 2015-04-14 ENCOUNTER — Other Ambulatory Visit: Payer: Self-pay

## 2015-04-14 MED ORDER — TEGRETOL 100 MG/5ML PO SUSP: ORAL | Status: DC

## 2015-04-16 ENCOUNTER — Other Ambulatory Visit: Payer: Self-pay | Admitting: Family

## 2015-08-07 ENCOUNTER — Other Ambulatory Visit: Payer: Self-pay | Admitting: Family

## 2015-08-07 DIAGNOSIS — F72 Severe intellectual disabilities: Secondary | ICD-10-CM

## 2015-08-07 DIAGNOSIS — K117 Disturbances of salivary secretion: Secondary | ICD-10-CM

## 2015-08-07 DIAGNOSIS — G808 Other cerebral palsy: Secondary | ICD-10-CM

## 2015-08-07 MED ORDER — AMBULATORY NON FORMULARY MEDICATION: Status: DC

## 2015-08-15 ENCOUNTER — Other Ambulatory Visit (HOSPITAL_COMMUNITY): Payer: Self-pay | Admitting: Interventional Radiology

## 2015-08-15 DIAGNOSIS — K9413 Enterostomy malfunction: Secondary | ICD-10-CM

## 2015-08-21 ENCOUNTER — Ambulatory Visit (HOSPITAL_COMMUNITY)
Admission: RE | Admit: 2015-08-21 | Discharge: 2015-08-21 | Disposition: A | Discharge status: Home / Self Care | Payer: 59 | Source: Ambulatory Visit | Attending: Interventional Radiology | Admitting: Interventional Radiology

## 2015-08-21 DIAGNOSIS — K9413 Enterostomy malfunction: Secondary | ICD-10-CM

## 2015-08-21 DIAGNOSIS — Z434 Encounter for attention to other artificial openings of digestive tract: Secondary | ICD-10-CM | POA: Insufficient documentation

## 2015-08-21 MED ORDER — IOPAMIDOL (ISOVUE-300) INJECTION 61%
INTRAVENOUS | Status: AC
  Administered 2015-08-21: 10 mL
  Filled 2015-08-21: qty 50

## 2015-08-21 MED ORDER — LIDOCAINE VISCOUS 2 % MT SOLN
OROMUCOSAL | Status: AC
  Administered 2015-08-21: 5 mL
  Filled 2015-08-21: qty 15

## 2015-08-21 NOTE — Procedures (Signed)
J tube exchange No complication No blood loss. [facility].

## 2015-08-24 ENCOUNTER — Encounter (HOSPITAL_COMMUNITY): Payer: Self-pay

## 2015-08-24 ENCOUNTER — Emergency Department (HOSPITAL_COMMUNITY)
Admission: EM | Admit: 2015-08-24 | Discharge: 2015-08-24 | Disposition: A | Discharge status: Home / Self Care | Payer: 59 | Attending: Emergency Medicine | Admitting: Emergency Medicine

## 2015-08-24 ENCOUNTER — Emergency Department (HOSPITAL_COMMUNITY): Payer: 59

## 2015-08-24 ENCOUNTER — Other Ambulatory Visit (HOSPITAL_COMMUNITY): Payer: Self-pay | Admitting: Interventional Radiology

## 2015-08-24 DIAGNOSIS — Z431 Encounter for attention to gastrostomy: Secondary | ICD-10-CM | POA: Diagnosis not present

## 2015-08-24 DIAGNOSIS — Z79899 Other long term (current) drug therapy: Secondary | ICD-10-CM | POA: Insufficient documentation

## 2015-08-24 DIAGNOSIS — F419 Anxiety disorder, unspecified: Secondary | ICD-10-CM | POA: Diagnosis not present

## 2015-08-24 DIAGNOSIS — G809 Cerebral palsy, unspecified: Secondary | ICD-10-CM

## 2015-08-24 DIAGNOSIS — K9423 Gastrostomy malfunction: Secondary | ICD-10-CM | POA: Diagnosis present

## 2015-08-24 DIAGNOSIS — Z8669 Personal history of other diseases of the nervous system and sense organs: Secondary | ICD-10-CM | POA: Insufficient documentation

## 2015-08-24 HISTORY — DX: Cerebral palsy, unspecified: G80.9

## 2015-08-24 MED ORDER — DIATRIZOATE MEGLUMINE & SODIUM 66-10 % PO SOLN
ORAL | Status: AC
  Administered 2015-08-24: 21:00:00 via GASTROSTOMY
  Filled 2015-08-24: qty 30

## 2015-08-24 MED ORDER — HYDROCODONE-ACETAMINOPHEN 7.5-325 MG/15ML PO SOLN: 15.0000 mL | Freq: Four times a day (QID) | ORAL | Status: AC | PRN

## 2015-08-24 MED ORDER — MORPHINE SULFATE (PF) 4 MG/ML IV SOLN
4.0000 mg | Freq: Once | INTRAVENOUS | Status: AC
  Administered 2015-08-24: 4 mg via INTRAMUSCULAR
  Filled 2015-08-24: qty 1

## 2015-08-24 NOTE — ED Notes (Signed)
Pt has cerebal palsy and was brought in by mother with c/o her g-tube leaking a brown fluid and her mom reports she has been yelling and crying more than usual.

## 2015-08-24 NOTE — ED Notes (Signed)
Pt left at this time with all belongings.  

## 2015-08-24 NOTE — Discharge Instructions (Signed)
Follow-up with the radiologist tomorrow as you have already scheduled

## 2015-08-24 NOTE — ED Provider Notes (Signed)
CSN: [id number]     Arrival date & time 08/24/15  1718 History   First MD Initiated Contact with Patient 08/24/15 1804     Chief Complaint  Patient presents with  . G-tube leaking      (Consider location/radiation/quality/duration/timing/severity/associated sxs/prior Treatment) HPI Comments: Patient here with leakage from her G-tube. G-tube was placed 3 days ago. Since that time patient has had increased pain as well as some clear drainage from around the tube site. No fever but vomiting was noted. She has history of cerebral palsy and is nonverbal. Symptoms are persistent nothing makes them better or worse. No treatment use prior to arrival  The history is provided by a parent.    Past Medical History  Diagnosis Date  . Seizures (HCC)   . Vision abnormalities   . CP (cerebral palsy) [facility])    Past Surgical History  Procedure Laterality Date  . Removal of gastrostomy tube     No family history on file. Social History  Substance Use Topics  . Smoking status: Never Smoker   . Smokeless tobacco: Never Used  . Alcohol Use: No   OB History    No data available     Review of Systems  All other systems reviewed and are negative.     Allergies  Review of patient's allergies indicates no known allergies.  Home Medications   Prior to Admission medications   Medication Sig Start Date End Date Taking? Authorizing Provider  AMBULATORY NON FORMULARY MEDICATION Medication Name: Baclofen Suspension 2 mg/mL  Take 10 mL by mouth every morning, 7.5 mL at midday, at 5 mL at bedtime 02/13/15   Elveria Rising, NP  AMBULATORY NON FORMULARY MEDICATION Medication Name: Glycopyrrolate  - compound to /35ml. Give 0.22ml every 8 hours (for drooling) 08/07/15   Elveria Rising, NP  clotrimazole-betamethasone (LOTRISONE) cream  01/02/14   Historical Provider, MD  ferrous sulfate 220 (44 FE) MG/5ML solution  12/14/13   Historical Provider, MD  FLUOCINOLONE ACETONIDE SCALP 0.01 % OIL Apply  topically at bedtime. 01/29/14   Historical Provider, MD  fluocinonide ointment (LIDEX) 0.05 %  01/06/14   Historical Provider, MD  lansoprazole (PREVACID SOLUTAB) 15 MG disintegrating tablet Give 1 tablet twice per day    Historical Provider, MD  medroxyPROGESTERone (DEPO-SUBQ PROVERA 104) 104 MG/0.65ML injection Inject 104 mg into the skin every 3 (three) months.    Historical Provider, MD  PHENObarbital 20 MG/5ML elixir TAKE 2 & 1/2 TEASPOONSFUL BY MOUTH TWICE A DAY 02/20/15   Elveria Rising, NP  TEGRETOL 100 MG/5ML suspension TAKE 11 MLS BY MOUTH THREE TIMES DAILY 04/17/15   Elveria Rising, NP   BP 118/104 mmHg  Pulse 110  Temp(Src)   Resp 22  SpO2 100% Physical Exam  Constitutional: She appears well-developed and well-nourished.  Non-toxic appearance. No distress.  HENT:  Head: Normocephalic and atraumatic.  Eyes: Conjunctivae, EOM and lids are normal. Pupils are equal, round, and reactive to light.  Neck: Normal range of motion. Neck supple. No tracheal deviation present. No thyroid mass present.  Cardiovascular: Normal rate, regular rhythm and normal heart sounds.  Exam reveals no gallop.   No murmur heard. Pulmonary/Chest: Effort normal and breath sounds normal. No stridor. No respiratory distress. She has no decreased breath sounds. She has no wheezes. She has no rhonchi. She has no rales.  Abdominal: Soft. Normal appearance and bowel sounds are normal. She exhibits no distension. There is no tenderness. There is no rebound and no CVA tenderness.  Musculoskeletal: Normal range of motion. She exhibits no edema or tenderness.  Neurological: She is alert. No cranial nerve deficit. GCS eye subscore is 4. GCS verbal subscore is 5. GCS motor subscore is 5.  Skin: Skin is warm and dry. No abrasion and no rash noted.  Psychiatric: Her speech is normal. Her mood appears anxious.  Nursing note and vitals reviewed.   ED Course  Procedures (including critical care time) Labs  Review Labs Reviewed - No data to display  Imaging Review No results found. I have personally reviewed and evaluated these images and lab results as part of my medical decision-making.   EKG Interpretation None      MDM   Final diagnoses:  None  Patient given morphine for pain here. Acute abdominal series did not show any signs of obstruction and she subsequently had a flatplate with Gastrografin which show good tube placement. Spoke with Dr. Bonnielee HaffHoss for interventional radiology recommends that patient follow-up with him tomorrow.    Lorre NickAnthony Romello Hoehn, MD 08/24/15 2147

## 2015-08-24 NOTE — ED Notes (Signed)
MD at bedside. 

## 2015-08-25 ENCOUNTER — Ambulatory Visit (HOSPITAL_COMMUNITY)
Admission: RE | Admit: 2015-08-25 | Discharge: 2015-08-25 | Disposition: A | Discharge status: Home / Self Care | Payer: 59 | Source: Ambulatory Visit | Attending: Interventional Radiology | Admitting: Interventional Radiology

## 2015-08-25 ENCOUNTER — Other Ambulatory Visit (HOSPITAL_COMMUNITY): Payer: Self-pay | Admitting: Interventional Radiology

## 2015-08-25 DIAGNOSIS — G809 Cerebral palsy, unspecified: Secondary | ICD-10-CM

## 2015-08-25 DIAGNOSIS — K9419 Other complications of enterostomy: Secondary | ICD-10-CM | POA: Diagnosis present

## 2015-08-25 DIAGNOSIS — K9413 Enterostomy malfunction: Secondary | ICD-10-CM | POA: Insufficient documentation

## 2015-08-25 DIAGNOSIS — Y733 Surgical instruments, materials and gastroenterology and urology devices (including sutures) associated with adverse incidents: Secondary | ICD-10-CM | POA: Diagnosis not present

## 2015-08-25 MED ORDER — IOPAMIDOL (ISOVUE-300) INJECTION 61%
10.0000 mL | Freq: Once | INTRAVENOUS | Status: AC | PRN
  Administered 2015-08-25: 10 mL

## 2015-08-25 MED ORDER — IOPAMIDOL (ISOVUE-300) INJECTION 61%
INTRAVENOUS | Status: AC
  Administered 2015-08-25: 10 mL
  Filled 2015-08-25: qty 50

## 2015-08-25 NOTE — Procedures (Signed)
Jtube balloon deflated retracted out of duodenum and reinflated at anterior gastric wall. Contrast injection confirms position No comp Stable Ready for use

## 2015-08-29 ENCOUNTER — Other Ambulatory Visit (HOSPITAL_COMMUNITY): Payer: Self-pay | Admitting: Interventional Radiology

## 2015-08-29 ENCOUNTER — Ambulatory Visit (HOSPITAL_COMMUNITY)
Admission: RE | Admit: 2015-08-29 | Discharge: 2015-08-29 | Disposition: A | Discharge status: Home / Self Care | Payer: 59 | Source: Ambulatory Visit | Attending: Interventional Radiology | Admitting: Interventional Radiology

## 2015-08-29 DIAGNOSIS — K9413 Enterostomy malfunction: Secondary | ICD-10-CM | POA: Diagnosis present

## 2015-08-29 DIAGNOSIS — G809 Cerebral palsy, unspecified: Secondary | ICD-10-CM

## 2015-08-29 DIAGNOSIS — Y733 Surgical instruments, materials and gastroenterology and urology devices (including sutures) associated with adverse incidents: Secondary | ICD-10-CM | POA: Insufficient documentation

## 2015-08-29 MED ORDER — IOPAMIDOL (ISOVUE-300) INJECTION 61%
INTRAVENOUS | Status: AC
  Administered 2015-08-29: 20 mL
  Filled 2015-08-29: qty 50

## 2015-09-03 ENCOUNTER — Other Ambulatory Visit: Payer: Self-pay | Admitting: Family

## 2015-09-04 ENCOUNTER — Other Ambulatory Visit: Payer: Self-pay | Admitting: Family

## 2015-09-04 DIAGNOSIS — G40319 Generalized idiopathic epilepsy and epileptic syndromes, intractable, without status epilepticus: Secondary | ICD-10-CM

## 2015-09-04 MED ORDER — TEGRETOL 100 MG/5ML PO SUSP: ORAL | Status: DC

## 2015-10-02 ENCOUNTER — Other Ambulatory Visit: Payer: Self-pay

## 2015-10-02 DIAGNOSIS — G808 Other cerebral palsy: Secondary | ICD-10-CM

## 2015-10-02 MED ORDER — AMBULATORY NON FORMULARY MEDICATION: Status: DC

## 2015-10-02 NOTE — Telephone Encounter (Signed)
Carla, [facility], lvm stating that the prescription for patient's Baclofen was distorted when it came off the fax machine. She said that she needs clarification on the directions.  CB# [phone] I called Albin FellingCarla and let her know the Baclofen Rx should read as follows:  Take 10 mL by mouth every morning, 7.5 mL at midday, at 5 mL at bedtime. She expressed understanding.

## 2016-03-15 ENCOUNTER — Other Ambulatory Visit: Payer: Self-pay | Admitting: Family

## 2016-03-15 NOTE — Telephone Encounter (Signed)
Please have family call for appointment: [phone]. No more refills will be authorized until seen.

## 2016-03-19 ENCOUNTER — Telehealth (INDEPENDENT_AMBULATORY_CARE_PROVIDER_SITE_OTHER): Payer: Self-pay | Admitting: Family

## 2016-03-19 ENCOUNTER — Other Ambulatory Visit: Payer: Self-pay | Admitting: Family

## 2016-03-19 DIAGNOSIS — G40319 Generalized idiopathic epilepsy and epileptic syndromes, intractable, without status epilepticus: Secondary | ICD-10-CM

## 2016-03-19 MED ORDER — TEGRETOL 100 MG/5ML PO SUSP: ORAL | 3 refills | Status: DC

## 2016-03-19 NOTE — Telephone Encounter (Signed)
Mom left a message that she needs the Tegretol directions updated to say that [name] takes 11ml BID and 12ml at 7:30PM. I updated the directions and sent in a new Rx. TG

## 2016-03-20 ENCOUNTER — Telehealth (INDEPENDENT_AMBULATORY_CARE_PROVIDER_SITE_OTHER): Payer: Self-pay | Admitting: Family

## 2016-03-20 NOTE — Telephone Encounter (Signed)
-----   Message from Elveria Risingina Goodpasture, NP sent at 03/19/2016 10:21 AM EST ----- Regarding: [facility]  Thanks,  Inetta Fermoina

## 2016-03-29 NOTE — Telephone Encounter (Signed)
Appointment scheduled for 04/17/16 at 10:15am

## 2016-03-29 NOTE — Telephone Encounter (Signed)
-----   Message from Tina Goodpasture, NP sent at 03/19/2016 10:21 AM EST ----- °Regarding: Needs appointment °Amber Curry needs an appointment with me.  °Thanks,  °Tina °

## 2016-04-02 ENCOUNTER — Telehealth (INDEPENDENT_AMBULATORY_CARE_PROVIDER_SITE_OTHER): Payer: Self-pay

## 2016-04-02 DIAGNOSIS — G808 Other cerebral palsy: Secondary | ICD-10-CM

## 2016-04-02 MED ORDER — AMBULATORY NON FORMULARY MEDICATION: 0 refills | Status: DC

## 2016-04-02 NOTE — Telephone Encounter (Signed)
Annell, mom, lvm stating that patient has an upcoming appointment with Inetta Fermoina on 1.31.18. She is requesting a refill on patient's Baclofen. Stated that patient will run out before the f/u appointment. Refill should be sent to [facility].CB# [phone]

## 2016-04-02 NOTE — Telephone Encounter (Signed)
Please let Mom know that the refill was sent in. Thanks, Draya Felker 

## 2016-04-02 NOTE — Telephone Encounter (Signed)
I called to let mom know the refill was sent as requested.

## 2016-04-16 ENCOUNTER — Encounter (INDEPENDENT_AMBULATORY_CARE_PROVIDER_SITE_OTHER): Payer: Self-pay | Admitting: Family

## 2016-04-16 NOTE — Progress Notes (Signed)
Patient: Amber Curry MRN: [id number] Sex: female DOB: October 23, 1990  Provider: Elveria Rising, NP Location of Care: [facility]  Note type: Routine return visit  History of Present Illness: Referral Source: Amber Overlie, MD History from: [facility] chart and caregiver Chief Complaint: Generalized nonconvulsive epilepsy with intractable epilepsy  Amber Curry is a 26 y.o. 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 Amber Curry has residuals of severe spastic quadriparesis, mixed seizures that are in fair control, severe dysphagia, cortical blindness, and profound mental retardation.Amber Curry was last seen February 13, 2015.  Amber Curry is here today with a caregiver who says that she that she has been physically healthy since her last visit. She says that her seizures are more frequent and tend to occur most frequently between 5:30 and 6:30AM. The caregiver called her mother to obtain more information and I talked with her by phone. Mom said that the seizures occurred about every morning, usually lasted 1 minute and sometimes occurred twice each morning. When the seizures occur, Amber Curry cries out, her arm extends, her body stiffens and she has jerking movements. After the seizure she sometimes returns to sleep but sometimes remains awake for the day. Mom said that Amber Curry has not missed any medication doses and that the seizure frequency has not diminished since the last Tegretol dose increase to 12ml at bedtime.   Amber Curry's caregiver has no other health concerns for her today other than previously mentioned.  Review of Systems: Please see the HPI for neurologic and other pertinent review of systems. Otherwise, the following systems are noncontributory including constitutional, eyes, ears, nose and throat, cardiovascular,  respiratory, gastrointestinal, genitourinary, musculoskeletal, skin, endocrine, hematologic/lymph, allergic/immunologic and psychiatric.   Past Medical History:  Diagnosis Date  . CP (cerebral palsy) (HCC)   . Seizures (HCC)   . Vision abnormalities    Hospitalizations: No., Head Injury: No., Nervous System Infections: No., Immunizations up to date: Yes.   Past Medical History Comments: She was born at [redacted] weeks gestation with good Apgars but severe metabolic acidosis, decline in hemoglobin, hypokalemia, and onset of seizures. She developed Grade 3 intracranial hemorrhage in the right caudate with subarachnoid blood and had severe posthemorrhagic hydrocephalus. She developed widespread cortical damage and initially had difficult to control seizures. She had bilateral hip dislocation repaired by Dr Remonia Richter. CT of the brain 07/16/89 with generalized cerebral atrophy and shunted hydrocephalus. EEG 11/29/93 showing diffuse background slowing and disorganization, 3 independent seizure foci without electrographic seizures.  Surgical History Past Surgical History:  Procedure Laterality Date  . REMOVAL OF GASTROSTOMY TUBE      Family History family history is not on file. Family History is otherwise negative for migraines, seizures, cognitive impairment, blindness, deafness, birth defects, chromosomal disorder, autism.  Social History Social History   Social History  . Marital status: Single    Spouse name: N/A  . Number of children: N/A  . Years of education: N/A   Social History Main Topics  . Smoking status: Never Smoker  . Smokeless tobacco: Never Used  . Alcohol use No  . Drug use: No  . Sexual activity: No   Other Topics Concern  . None   Social History Narrative   Amber Curry stays at home and receives care from her mother and a home health aid.     Allergies No Known Allergies  Physical Exam BP 110/66  Pulse 88  General: small microcephalic female, seated in  wheelchair, in no evident distress Head: head microcephalic and atraumatic.  Ears, Nose and Throat: unable to fully examine. tongue protruding. unable to examine oropharynx Neck: supple with no carotid or supraclavicular bruits. Respiratory: lungs clear to auscultation Cardiovascular: regular rate and rhythm, no murmurs Musculoskeletal: Bilateral spastic quadriparesis with contractures Skin: no rashes or lesions Trunk: g-tube intact and clamped  Neurologic Exam  Mental Status: Profound mental retardation. Does not open eyes or pay attention to the examiner. Has no language. does not obey any commands.  Cranial Nerves: Sluggish nonreactive pupils. Unable to visualize either fundus. Dysconjugate eye movements. No reactions to visual stimuli. Bilateral facial weakness with drooling. Midline tongue. She startles to sudden sounds. She has an occasional random smile that is symmetric. She blinks to threat.  Motor: Bilateral spastic quadriparesis. Unable to assess strength. Resists movements of the extremities by the examiner. Sensory: Withdrawal x 4 Coordination: No spontaneous movements Gait and Station: Unable to stand or bear weight Reflexes: Absent and symmetric  Impression 1. 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 Microcephalus 4. Severe intellectual delay 5. Cortical blindness  Recommendations for plan of care [facility] [name] has neither had nor required imaging or lab studies since the last visit. 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 She has residuals of severe spastic quadriparesis, mixed seizures that are in fair control, severe dysphagia, cortical blindness, and severe intellectual delay.  She is here today with a caregiver who reports increase in seizure frequency. After talking with Amber Curry's mother by phone, I recommended performing an EEG to determine if her seizure characteristics have changed. I will call Mom when the EEG has been performed and read to discuss treatment options. Mom agreed with this plan.   The medication list was reviewed and reconciled.  No changes were made in the prescribed medications today.  A complete medication list was provided to the patient/caregiver.  Allergies as of 04/17/2016   No Known Allergies     Medication List       Accurate as of 04/17/16 11:59 PM. Always use your most recent med list.          AMBULATORY NON FORMULARY MEDICATION Medication Name: Glycopyrrolate 1mg  - compound to 1mg /451ml. Give 0.313ml every 8 hours (for drooling)   AMBULATORY NON FORMULARY MEDICATION Medication Name: Baclofen Suspension 2 mg/mL  Take 10 mL by mouth every morning, 7.5 mL at midday, at 5 mL at bedtime   clotrimazole-betamethasone cream Commonly known as:  LOTRISONE   MedroxyPROGESTERone Acetate 150 MG/ML Susy Inject 150 mg into the muscle every 3 (three) months.   DEPO-SUBQ PROVERA 104 104 MG/0.65ML injection Generic drug:  medroxyPROGESTERone Inject 104 mg into the skin every 3 (three) months.   ferrous sulfate 220 (44 Fe) MG/5ML solution   Fluocinolone Acetonide Scalp 0.01 % Oil Apply topically at bedtime.   Fluocinolone Acetonide Scalp 0.01 % Oil Apply once a week   fluocinonide ointment 0.05 % Commonly known as:  LIDEX   HYDROcodone-acetaminophen 7.5-325 mg/15 ml solution Commonly known as:  HYCET Take 15 mLs by mouth every 6 (six) hours as needed for moderate pain.   lansoprazole 15 MG disintegrating tablet Commonly known as:  PREVACID SOLUTAB Give 1 tablet twice per day   PHENObarbital 20  MG/5ML elixir GIVE 2 AND 1/2 TEASPOONSFUL BY MOUTH TWICE DAILY   TEGRETOL 100 MG/5ML suspension Generic drug:  carBAMazepine TAKE 11 MLS  IN THE MORNING AND AT NIGHT AND TAKE AT 7:30PM DAILY       Dr. Sharene Skeans was consulted regarding the patient.   Total time spent with the patient was 30 minutes, of which 50% or more was spent in counseling and coordination of care.   Amber Rising NP-C

## 2016-04-17 ENCOUNTER — Encounter (INDEPENDENT_AMBULATORY_CARE_PROVIDER_SITE_OTHER): Payer: Self-pay | Admitting: Family

## 2016-04-17 ENCOUNTER — Other Ambulatory Visit (INDEPENDENT_AMBULATORY_CARE_PROVIDER_SITE_OTHER): Payer: Self-pay | Admitting: Family

## 2016-04-17 ENCOUNTER — Ambulatory Visit (INDEPENDENT_AMBULATORY_CARE_PROVIDER_SITE_OTHER): Payer: 59 | Admitting: Family

## 2016-04-17 VITALS — BP 110/66 | HR 88

## 2016-04-17 DIAGNOSIS — G40319 Generalized idiopathic epilepsy and epileptic syndromes, intractable, without status epilepticus: Secondary | ICD-10-CM

## 2016-04-17 DIAGNOSIS — K117 Disturbances of salivary secretion: Secondary | ICD-10-CM | POA: Diagnosis not present

## 2016-04-17 DIAGNOSIS — F72 Severe intellectual disabilities: Secondary | ICD-10-CM

## 2016-04-17 DIAGNOSIS — H47619 Cortical blindness, unspecified side of brain: Secondary | ICD-10-CM

## 2016-04-17 DIAGNOSIS — G808 Other cerebral palsy: Secondary | ICD-10-CM

## 2016-04-17 DIAGNOSIS — Q02 Microcephaly: Secondary | ICD-10-CM

## 2016-04-17 NOTE — Patient Instructions (Addendum)
Continue Amber Curry's medication as you have been giving it for now. We will perform an EEG to determine if her seizures have changed and if she needs to be on a different medication.   The EEG [facility] Feb[facility]ary 6, 2018 [facility] Please plan to register in admitting on the first floor by 9:15AM.   For the EEG, [name] needs to have clean hair, with no braids or hair accessories that cannot be removed. The test will take about an hour.   Dr Sharene SkeansHickling reads the EEG, and after the EEG has been read, he or I will call you with the report and instructions.   Depending on the EEG results, we will make a [facility]. We will plan for a year, but if that changes, I will let you know.

## 2016-04-23 ENCOUNTER — Ambulatory Visit (HOSPITAL_COMMUNITY)
Admission: RE | Admit: 2016-04-23 | Discharge: 2016-04-23 | Disposition: A | Discharge status: Home / Self Care | Payer: 59 | Source: Ambulatory Visit | Attending: Family | Admitting: Family

## 2016-04-23 DIAGNOSIS — G40319 Generalized idiopathic epilepsy and epileptic syndromes, intractable, without status epilepticus: Secondary | ICD-10-CM | POA: Diagnosis not present

## 2016-04-23 DIAGNOSIS — G40309 Generalized idiopathic epilepsy and epileptic syndromes, not intractable, without status epilepticus: Secondary | ICD-10-CM

## 2016-04-23 NOTE — Progress Notes (Signed)
EEG Completed; Results Pending  

## 2016-04-23 NOTE — Procedures (Signed)
Patient: Amber Curry MRN: [id number] Sex: female DOB: 03/25/1990  Clinical History: [name] is a 26 y.o. 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  She cries out extends her arms her body stiffens and she has jerking movements.  After the seizure she returns to sleep and but may remain awake.  This study is performed to look for the presence of seizures..  Medications: carbamazepine (Tegretol) and phenobarbital  Procedure: The tracing is carried out on a 32-channel digital Cadwell recorder, reformatted into 16-channel montages with 1 devoted to EKG.  The patient was awake during the recording.  The international 10/20 system lead placement used.  Recording time 25 minutes.   Description of Findings: There is no dominant frequency.    Background activity consists of mixed frequency 4 Hz 50 V activity it's probably distributed 6-7 Hz 30 V activity seen centrally and posteriorly and 1-2 Hz 70 V polymorphic delta range activity in the posterior head regions.  There were rare central sharp waves throughout the record.  Towards the end of the record the patient had a 13 second event of 3 Hz 165 V activity that slowed to 1 Hz associated with eyelid blinking at the same frequency as the frontal activity.  No other behavior was seen..  Activating procedures were not performed.  EKG showed a regular sinus rhythm with a ventricular response of 78 beats per minute.  Impression: This is a abnormal record with the patient awake.  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  The patient had a clinical and electrographic seizure noted above.  Ellison CarwinWilliam Hickling, MD

## 2016-04-24 ENCOUNTER — Telehealth (INDEPENDENT_AMBULATORY_CARE_PROVIDER_SITE_OTHER): Payer: Self-pay | Admitting: Family

## 2016-04-24 DIAGNOSIS — G40319 Generalized idiopathic epilepsy and epileptic syndromes, intractable, without status epilepticus: Secondary | ICD-10-CM

## 2016-04-24 MED ORDER — LEVETIRACETAM 100 MG/ML PO SOLN: ORAL | 5 refills | Status: DC

## 2016-04-24 NOTE — Telephone Encounter (Signed)
Mom called me back and I reviewed the EEG results with her. I told her that Dr Sharene SkeansHickling had read the EEG and that his recommendation was to start Levetiracetam 250mg  BID in addition to [facility] other medications. I reviewed the medication with her, including possible side effects. I asked her to let me know in a few weeks if the seizures continue. Mom agreed with the plans made today.

## 2016-04-24 NOTE — Telephone Encounter (Signed)
I called Mom and left a message asking her to call me to review the EEG results for [facility]. TG

## 2016-04-26 ENCOUNTER — Telehealth (INDEPENDENT_AMBULATORY_CARE_PROVIDER_SITE_OTHER): Payer: Self-pay | Admitting: Family

## 2016-04-26 DIAGNOSIS — G40319 Generalized idiopathic epilepsy and epileptic syndromes, intractable, without status epilepticus: Secondary | ICD-10-CM

## 2016-04-26 MED ORDER — LEVETIRACETAM 100 MG/ML PO SOLN: ORAL | 5 refills | Status: DC

## 2016-04-26 NOTE — Telephone Encounter (Signed)
This has been taken care of.

## 2016-04-26 NOTE — Telephone Encounter (Signed)
°  Who's calling (name and relationship to patient) : Madilyne  Best contact number: [phone] Provider they see: Goodpasture Reason for call: The new seizure medication has not been sent to pharmacy    PRESCRIPTION REFILL ONLY  Name of prescription: New   [facility] [address]

## 2016-04-26 NOTE — Telephone Encounter (Signed)
The initial prescription was sent [facility].  Please call them and cancel it.  A new prescription was [facility] to [facility] [address].  Please contact the family.

## 2016-04-30 ENCOUNTER — Other Ambulatory Visit (HOSPITAL_COMMUNITY): Payer: Self-pay | Admitting: Interventional Radiology

## 2016-04-30 DIAGNOSIS — G809 Cerebral palsy, unspecified: Secondary | ICD-10-CM

## 2016-05-01 ENCOUNTER — Other Ambulatory Visit (INDEPENDENT_AMBULATORY_CARE_PROVIDER_SITE_OTHER): Payer: Self-pay | Admitting: Family

## 2016-05-01 DIAGNOSIS — G808 Other cerebral palsy: Secondary | ICD-10-CM

## 2016-05-01 MED ORDER — AMBULATORY NON FORMULARY MEDICATION: 5 refills | Status: DC

## 2016-05-07 ENCOUNTER — Ambulatory Visit (HOSPITAL_COMMUNITY)
Admission: RE | Admit: 2016-05-07 | Discharge: 2016-05-07 | Disposition: A | Discharge status: Home / Self Care | Payer: 59 | Source: Ambulatory Visit | Attending: Interventional Radiology | Admitting: Interventional Radiology

## 2016-05-07 ENCOUNTER — Encounter (HOSPITAL_COMMUNITY): Payer: Self-pay | Admitting: Interventional Radiology

## 2016-05-07 DIAGNOSIS — Z431 Encounter for attention to gastrostomy: Secondary | ICD-10-CM | POA: Diagnosis not present

## 2016-05-07 DIAGNOSIS — G809 Cerebral palsy, unspecified: Secondary | ICD-10-CM

## 2016-05-07 DIAGNOSIS — K9423 Gastrostomy malfunction: Secondary | ICD-10-CM | POA: Diagnosis present

## 2016-05-07 HISTORY — PX: IR GENERIC HISTORICAL: IMG1180011

## 2016-05-07 MED ORDER — IOPAMIDOL (ISOVUE-300) INJECTION 61%
INTRAVENOUS | Status: AC
  Administered 2016-05-07: 10 mL
  Filled 2016-05-07: qty 50

## 2016-05-14 ENCOUNTER — Telehealth (INDEPENDENT_AMBULATORY_CARE_PROVIDER_SITE_OTHER): Payer: Self-pay

## 2016-05-14 DIAGNOSIS — G40319 Generalized idiopathic epilepsy and epileptic syndromes, intractable, without status epilepticus: Secondary | ICD-10-CM

## 2016-05-14 MED ORDER — LEVETIRACETAM 100 MG/ML PO SOLN: ORAL | 5 refills | Status: DC

## 2016-05-14 NOTE — Telephone Encounter (Signed)
Reviewed your note and agree with this plan.  Thank you

## 2016-05-14 NOTE — Telephone Encounter (Signed)
"  Amber Curry", mom, lvm stating that she was having issues with child's medication, and also gave an update on child's sz activity.  I called Amber Curry and she said that child is going to run out of levetiracetam before it is time to refill it again. She said that she, herself, is administering the medication, so she knows that it is being dispensed properly. She is giving 2.5 mL po BID with [facility]. I confirmed pharmacy with mother, [facility] [address] Amber Curry also stated that the levetiracetam is working well. Child's seizures have decreased from several a day to one a day. The seizures happen at different times through the morning and does not happen at nighttime. Please call Amber Curry at: CB# [phone].

## 2016-05-14 NOTE — Telephone Encounter (Signed)
I called and talked to Mom. She said that [facility] had tolerated the Levetiracetam well and that the seizures had improved but were still occurring. I recommended increasing the dose to 3ml BID and asked her to call me in 1 week. I will send in a new Rx for the increase in dose. Mom agreed with this plan. TG

## 2016-05-31 ENCOUNTER — Other Ambulatory Visit (HOSPITAL_COMMUNITY): Payer: Self-pay | Admitting: Physician Assistant

## 2016-05-31 DIAGNOSIS — K9423 Gastrostomy malfunction: Secondary | ICD-10-CM

## 2016-06-03 NOTE — Telephone Encounter (Signed)
Dondra SpryGail, mom, lvm stating that since the recent increase of levetiracetam, Maleya is having one seizure daily. CB# [phone]

## 2016-06-04 NOTE — Telephone Encounter (Signed)
I left a message for Mom and told her that I will call her tomorrow. TG

## 2016-06-05 MED ORDER — LEVETIRACETAM 100 MG/ML PO SOLN: ORAL | 5 refills | Status: DC

## 2016-06-05 NOTE — Telephone Encounter (Signed)
I called and talked to Mom. She said that [name] now having about 1 seizure per day with the increase in Levetiracetam. She was having no side effects and in fact, Mom had noted that she was more alert. Mom was pleased and asked if she could increase the dose again since [name]'s seizure control had improved so much on the medication. I told Mom to increase the Levetiracetam to 4ml BID and to let me know in a week or so how Amber Curry was doing. I updated the Rx at the pharmacy. Mom agreed with this plan. TG

## 2016-06-05 NOTE — Addendum Note (Signed)
Addended by: Princella IonGOODPASTURE, Xochil Shanker P on: 06/05/2016 03:58 PM   Modules accepted: Orders

## 2016-06-05 NOTE — Telephone Encounter (Signed)
I reviewed your notes and agree with this plan. 

## 2016-06-12 ENCOUNTER — Ambulatory Visit (HOSPITAL_COMMUNITY)
Admission: RE | Admit: 2016-06-12 | Discharge: 2016-06-12 | Disposition: A | Discharge status: Home / Self Care | Payer: 59 | Source: Ambulatory Visit | Attending: Interventional Radiology | Admitting: Interventional Radiology

## 2016-06-12 ENCOUNTER — Encounter (HOSPITAL_COMMUNITY): Payer: Self-pay | Admitting: Interventional Radiology

## 2016-06-12 ENCOUNTER — Other Ambulatory Visit (HOSPITAL_COMMUNITY): Payer: Self-pay | Admitting: Interventional Radiology

## 2016-06-12 DIAGNOSIS — G808 Other cerebral palsy: Secondary | ICD-10-CM

## 2016-06-12 DIAGNOSIS — K9413 Enterostomy malfunction: Secondary | ICD-10-CM | POA: Diagnosis present

## 2016-06-12 DIAGNOSIS — Y733 Surgical instruments, materials and gastroenterology and urology devices (including sutures) associated with adverse incidents: Secondary | ICD-10-CM | POA: Insufficient documentation

## 2016-06-12 HISTORY — PX: IR GENERIC HISTORICAL: IMG1180011

## 2016-06-12 MED ORDER — IOPAMIDOL (ISOVUE-300) INJECTION 61%
INTRAVENOUS | Status: AC
  Administered 2016-06-12: 40 mL
  Filled 2016-06-12: qty 50

## 2016-06-12 MED ORDER — LIDOCAINE VISCOUS 2 % MT SOLN
OROMUCOSAL | Status: AC
  Filled 2016-06-12: qty 15

## 2016-06-12 NOTE — Procedures (Signed)
Successful fluoroscopic guided replacement of feeding jejunostomy tube.  No immediate post procedural complications.  The feeding tube is ready for immediate use.  Katherina RightJay Elvie Palomo, MD Pager #: [phone]

## 2016-07-03 ENCOUNTER — Other Ambulatory Visit (INDEPENDENT_AMBULATORY_CARE_PROVIDER_SITE_OTHER): Payer: Self-pay | Admitting: Family

## 2016-07-03 DIAGNOSIS — K117 Disturbances of salivary secretion: Secondary | ICD-10-CM

## 2016-07-03 DIAGNOSIS — F72 Severe intellectual disabilities: Secondary | ICD-10-CM

## 2016-07-03 DIAGNOSIS — G808 Other cerebral palsy: Secondary | ICD-10-CM

## 2016-07-03 MED ORDER — AMBULATORY NON FORMULARY MEDICATION: 5 refills | Status: DC

## 2016-07-23 ENCOUNTER — Other Ambulatory Visit (INDEPENDENT_AMBULATORY_CARE_PROVIDER_SITE_OTHER): Payer: Self-pay | Admitting: Family

## 2016-07-23 DIAGNOSIS — G40319 Generalized idiopathic epilepsy and epileptic syndromes, intractable, without status epilepticus: Secondary | ICD-10-CM

## 2016-08-23 ENCOUNTER — Other Ambulatory Visit (INDEPENDENT_AMBULATORY_CARE_PROVIDER_SITE_OTHER): Payer: Self-pay | Admitting: Family

## 2016-08-23 DIAGNOSIS — G40319 Generalized idiopathic epilepsy and epileptic syndromes, intractable, without status epilepticus: Secondary | ICD-10-CM

## 2016-08-23 MED ORDER — LEVETIRACETAM 100 MG/ML PO SOLN: ORAL | 3 refills | Status: DC

## 2016-08-26 ENCOUNTER — Other Ambulatory Visit (INDEPENDENT_AMBULATORY_CARE_PROVIDER_SITE_OTHER): Payer: Self-pay | Admitting: Family

## 2016-08-26 DIAGNOSIS — G40319 Generalized idiopathic epilepsy and epileptic syndromes, intractable, without status epilepticus: Secondary | ICD-10-CM

## 2016-08-26 MED ORDER — LEVETIRACETAM 100 MG/ML PO SOLN: ORAL | 3 refills | Status: DC

## 2016-08-29 ENCOUNTER — Telehealth (INDEPENDENT_AMBULATORY_CARE_PROVIDER_SITE_OTHER): Payer: Self-pay | Admitting: Family

## 2016-08-29 DIAGNOSIS — G40319 Generalized idiopathic epilepsy and epileptic syndromes, intractable, without status epilepticus: Secondary | ICD-10-CM

## 2016-08-29 MED ORDER — LEVETIRACETAM 100 MG/ML PO SOLN: ORAL | 3 refills | Status: DC

## 2016-08-29 NOTE — Telephone Encounter (Signed)
°  Who's calling (name and relationship to patient) : Mother Dondra Spry(Gail) Best contact number: [phone] Provider they see: Elveria Risingina Goodpasture, NP Reason for call: Increase of seizures mother states more than usual.    PRESCRIPTION REFILL ONLY  Name of prescription:  Pharmacy:

## 2016-08-29 NOTE — Telephone Encounter (Signed)
I accepted a call from Amber Curry who said that Amber Curry has been having more seizures for the last week or so, especially early morning, lasting 45 seconds to 1 minute. She said that [name] been generally healthy, has been sleeping well and has not missed medication doses. I instructed Mom to increase the Levetiracetam dose to 5ml twice per day. Mom also said that she has called since Tuesday, leaving messages in general voicemail with no call back. I apologized to Mom and told her that I would call her back tomorrow to check on [name]. I updated the medication list with the new instructions. TG

## 2016-08-29 NOTE — Telephone Encounter (Signed)
I reviewed your note and agree with your recommendations.  When you next speech mother, she needs to sign up for My Chart to avoid the same problem which occurred with our telephones.

## 2016-08-30 NOTE — Telephone Encounter (Signed)
I called Mom to check on Amber Curry. She said that she had tolerated the increase in dose without side effects but that she had 2 seizures this AM, one at 6AM and one at 930AM. I told Mom that the increase in dose had not been in her system long enough to take effect. I will call her again on Monday June 18th to check on her. Mom agreed with this plan. TG

## 2016-09-02 NOTE — Telephone Encounter (Signed)
I called Mom and talked with her about Amber Curry's seizures. She said that the seizure frequency had decreased since our phone conversation on 08/30/16. Amber Curry had a 20 second seizure this morning shortly after awakening but has had no further seizures today. I recommended to Mom that we continue the same dose for another week and asked Mom to call me in week to report on Amber Curry's seizures. Mom agreed with this plan. TG

## 2016-09-02 NOTE — Telephone Encounter (Signed)
I left a message for Mom and asked her to call me back. TG 

## 2016-10-10 ENCOUNTER — Other Ambulatory Visit (INDEPENDENT_AMBULATORY_CARE_PROVIDER_SITE_OTHER): Payer: Self-pay | Admitting: Family

## 2016-10-30 ENCOUNTER — Other Ambulatory Visit (INDEPENDENT_AMBULATORY_CARE_PROVIDER_SITE_OTHER): Payer: Self-pay | Admitting: Family

## 2016-10-30 DIAGNOSIS — G808 Other cerebral palsy: Secondary | ICD-10-CM

## 2016-10-30 MED ORDER — AMBULATORY NON FORMULARY MEDICATION: 5 refills | Status: DC

## 2016-11-21 ENCOUNTER — Telehealth (INDEPENDENT_AMBULATORY_CARE_PROVIDER_SITE_OTHER): Payer: Self-pay | Admitting: Family

## 2016-11-21 DIAGNOSIS — G808 Other cerebral palsy: Secondary | ICD-10-CM

## 2016-11-21 MED ORDER — AMBULATORY NON FORMULARY MEDICATION: 5 refills | Status: DC

## 2016-11-21 NOTE — Telephone Encounter (Signed)
  Who's calling (name and relationship to patient) : [facility]  Best contact number: [phone]  Provider they see: Elveria Risingina Goodpasture, FNP  Reason for call: Sam stated he has questions regarding the Baclofen Suspension.  Please call him back at the number above.     PRESCRIPTION REFILL ONLY  Name of prescription:  Pharmacy:

## 2016-11-21 NOTE — Telephone Encounter (Signed)
I called Sam back and he asked if the Baclofen suspension could be changed to /ml to simply her regimen and I agreed. She will now take 2ml in the morning, 1.5 at midday and 1 in the evening. TG

## 2016-12-31 ENCOUNTER — Telehealth (INDEPENDENT_AMBULATORY_CARE_PROVIDER_SITE_OTHER): Payer: Self-pay | Admitting: Family

## 2016-12-31 NOTE — Telephone Encounter (Signed)
I called the pharmacy. The pharmacist said that the Phenobarbital could lower the Tegretol level. I told her that the patient had been taking both medications for years and that we were monitoring her, and that it was ok to fill the medications. TG

## 2016-12-31 NOTE — Telephone Encounter (Signed)
°  Who's calling (name and [facility]  Best contact number: [phone] Provider they see: Goodpasture  Reason for call: Pharmacy called about a drug interaction of patient medication. Please call.    PRESCRIPTION REFILL ONLY  Name of prescription:  Pharmacy:

## 2017-01-02 ENCOUNTER — Other Ambulatory Visit: Payer: Self-pay | Admitting: Family

## 2017-01-02 DIAGNOSIS — G40319 Generalized idiopathic epilepsy and epileptic syndromes, intractable, without status epilepticus: Secondary | ICD-10-CM

## 2017-01-02 MED ORDER — LEVETIRACETAM 100 MG/ML PO SOLN: ORAL | 3 refills | Status: DC

## 2017-01-02 NOTE — Telephone Encounter (Signed)
°  Who's calling (name and relationship to patient) : Mom/Gail Best contact number: [phone] Provider they see: Elveria Risingina Goodpasture Reason for call: Mom left vmail @ 219pm, requesting a call back from [place] G. As soon as possible to speak to her about pt please; did not specify reason behind call on message. Called Mom back at 447pm, left vmail.

## 2017-01-02 NOTE — Telephone Encounter (Signed)
I called Mom back. She said that the pharmacy had not received the updated Rx from June for Levetiracetam to be 5ml BID and that she was running out of the medication early each month. Mom [facility] [address], and I did so. TG

## 2017-01-02 NOTE — Telephone Encounter (Signed)
I called Mom and left a message. I will call her again before I leave for the day. TG

## 2017-02-04 ENCOUNTER — Other Ambulatory Visit (INDEPENDENT_AMBULATORY_CARE_PROVIDER_SITE_OTHER): Payer: Self-pay | Admitting: Family

## 2017-02-04 ENCOUNTER — Encounter (INDEPENDENT_AMBULATORY_CARE_PROVIDER_SITE_OTHER): Payer: Self-pay

## 2017-02-04 DIAGNOSIS — G808 Other cerebral palsy: Secondary | ICD-10-CM

## 2017-02-04 MED ORDER — AMBULATORY NON FORMULARY MEDICATION: 5 refills | Status: DC

## 2017-02-04 NOTE — Telephone Encounter (Signed)
A user error has taken place: encounter opened in error, closed for administrative reasons.

## 2017-03-12 ENCOUNTER — Encounter (HOSPITAL_COMMUNITY): Payer: Self-pay | Admitting: Interventional Radiology

## 2017-03-12 ENCOUNTER — Other Ambulatory Visit (HOSPITAL_COMMUNITY): Payer: Self-pay | Admitting: Interventional Radiology

## 2017-03-12 ENCOUNTER — Ambulatory Visit (HOSPITAL_COMMUNITY)
Admission: RE | Admit: 2017-03-12 | Discharge: 2017-03-12 | Disposition: A | Discharge status: Home / Self Care | Payer: 59 | Source: Ambulatory Visit | Attending: Interventional Radiology | Admitting: Interventional Radiology

## 2017-03-12 DIAGNOSIS — Z431 Encounter for attention to gastrostomy: Secondary | ICD-10-CM | POA: Insufficient documentation

## 2017-03-12 DIAGNOSIS — R633 Feeding difficulties, unspecified: Secondary | ICD-10-CM

## 2017-03-12 DIAGNOSIS — R1310 Dysphagia, unspecified: Secondary | ICD-10-CM | POA: Insufficient documentation

## 2017-03-12 HISTORY — PX: IR REPLC DUODEN/JEJUNO TUBE PERCUT W/FLUORO: IMG2334

## 2017-03-12 MED ORDER — IOPAMIDOL (ISOVUE-300) INJECTION 61%
INTRAVENOUS | Status: AC
  Administered 2017-03-12: 20 mL
  Filled 2017-03-12: qty 50

## 2017-03-12 MED ORDER — LIDOCAINE VISCOUS 2 % MT SOLN
OROMUCOSAL | Status: AC
  Administered 2017-03-12: 5 mL via INTRADERMAL
  Filled 2017-03-12: qty 15

## 2017-03-12 MED ORDER — IOPAMIDOL (ISOVUE-300) INJECTION 61%
50.0000 mL | Freq: Once | INTRAVENOUS | Status: AC | PRN
  Administered 2017-03-12: 20 mL

## 2017-03-12 NOTE — Procedures (Signed)
Interventional Radiology Procedure Note  Procedure: Image exchange of percutaneous gastro-jejunostomy tube, which is a single-lumen tube functioning as jejunostomy.  New 53F jejunostomy placed.   .  Complications: None  Recommendations:  - Ok to use - Routine care   Signed,  Yvone NeuJaime S. Loreta AveWagner, DO

## 2017-04-15 ENCOUNTER — Telehealth (INDEPENDENT_AMBULATORY_CARE_PROVIDER_SITE_OTHER): Payer: Self-pay | Admitting: Family

## 2017-04-15 DIAGNOSIS — G40319 Generalized idiopathic epilepsy and epileptic syndromes, intractable, without status epilepticus: Secondary | ICD-10-CM

## 2017-04-15 MED ORDER — TEGRETOL 100 MG/5ML PO SUSP: ORAL | 5 refills | Status: DC

## 2017-04-15 NOTE — Telephone Encounter (Signed)
Per Amber Curry, she needs to check her notes to make sure this has not already been done

## 2017-04-15 NOTE — Telephone Encounter (Signed)
°  Who's calling (name and relationship to patient) : Cornella (mom) Best contact number: [phone] Provider they see: Goodpasture  Reason for call: Mom call left voice message for a PA needed for patient Tegretol.  Please call.     PRESCRIPTION REFILL ONLY  Name of prescription:  Pharmacy:

## 2017-04-15 NOTE — Telephone Encounter (Signed)
L/M informing mom that PA was done on January 15 and it is good until December 31. Also we sent a rx to the pharmacy

## 2017-04-15 NOTE — Telephone Encounter (Signed)
The PA for Tegretol for [facility] was done Jan 15th and is good through 03/17/18. Please let Mom know. Also, I printed a new Rx that needs to be sent to [facility]. Please ask her to check with the pharmacy for the refill. Thanks, Inetta Fermoina

## 2017-04-21 ENCOUNTER — Other Ambulatory Visit (INDEPENDENT_AMBULATORY_CARE_PROVIDER_SITE_OTHER): Payer: Self-pay | Admitting: Family

## 2017-04-24 ENCOUNTER — Emergency Department (HOSPITAL_COMMUNITY): Payer: Medicare Other

## 2017-04-24 ENCOUNTER — Ambulatory Visit (HOSPITAL_COMMUNITY)
Admission: EM | Admit: 2017-04-24 | Discharge: 2017-04-24 | Disposition: A | Discharge status: Home / Self Care | Payer: Medicare Other | Source: Home / Self Care | Attending: Family Medicine | Admitting: Family Medicine

## 2017-04-24 ENCOUNTER — Emergency Department (HOSPITAL_COMMUNITY)
Admission: EM | Admit: 2017-04-24 | Discharge: 2017-04-24 | Disposition: A | Discharge status: Home / Self Care | Payer: Medicare Other | Attending: Emergency Medicine | Admitting: Emergency Medicine

## 2017-04-24 ENCOUNTER — Encounter (HOSPITAL_COMMUNITY): Payer: Self-pay | Admitting: Family Medicine

## 2017-04-24 DIAGNOSIS — R34 Anuria and oliguria: Secondary | ICD-10-CM | POA: Diagnosis not present

## 2017-04-24 DIAGNOSIS — G809 Cerebral palsy, unspecified: Secondary | ICD-10-CM | POA: Insufficient documentation

## 2017-04-24 DIAGNOSIS — R059 Cough, unspecified: Secondary | ICD-10-CM

## 2017-04-24 DIAGNOSIS — R05 Cough: Secondary | ICD-10-CM | POA: Diagnosis not present

## 2017-04-24 DIAGNOSIS — J189 Pneumonia, unspecified organism: Secondary | ICD-10-CM | POA: Diagnosis not present

## 2017-04-24 DIAGNOSIS — Z79899 Other long term (current) drug therapy: Secondary | ICD-10-CM | POA: Insufficient documentation

## 2017-04-24 DIAGNOSIS — B999 Unspecified infectious disease: Secondary | ICD-10-CM

## 2017-04-24 DIAGNOSIS — J181 Lobar pneumonia, unspecified organism: Secondary | ICD-10-CM

## 2017-04-24 LAB — URINALYSIS, ROUTINE W REFLEX MICROSCOPIC
Bilirubin Urine: NEGATIVE
Glucose, UA: NEGATIVE mg/dL
Hgb urine dipstick: NEGATIVE
KETONES UR: NEGATIVE mg/dL
NITRITE: NEGATIVE
PROTEIN: 30 mg/dL — AB
Specific Gravity, Urine: 1.015 (ref 1.005–1.030)
pH: >9 — ABNORMAL HIGH (ref 5.0–8.0)

## 2017-04-24 LAB — URINALYSIS, MICROSCOPIC (REFLEX): RBC / HPF: NONE SEEN RBC/hpf (ref 0–5)

## 2017-04-24 LAB — COMPREHENSIVE METABOLIC PANEL
ALBUMIN: 3.1 g/dL — AB (ref 3.5–5.0)
ALK PHOS: 79 U/L (ref 38–126)
ALT: 22 U/L (ref 14–54)
AST: 23 U/L (ref 15–41)
Anion gap: 12 (ref 5–15)
BUN: 5 mg/dL — ABNORMAL LOW (ref 6–20)
CALCIUM: 8.5 mg/dL — AB (ref 8.9–10.3)
CO2: 25 mmol/L (ref 22–32)
CREATININE: 0.43 mg/dL — AB (ref 0.44–1.00)
Chloride: 100 mmol/L — ABNORMAL LOW (ref 101–111)
GFR calc Af Amer: >60 mL/min (ref >60)
GFR calc non Af Amer: >60 mL/min (ref >60)
GLUCOSE: 87 mg/dL (ref 65–99)
Potassium: 3.7 mmol/L (ref 3.5–5.1)
SODIUM: 137 mmol/L (ref 135–145)
Total Bilirubin: 0.5 mg/dL (ref 0.3–1.2)
Total Protein: 6.7 g/dL (ref 6.5–8.1)

## 2017-04-24 LAB — CBC
HCT: 46.7 % — ABNORMAL HIGH (ref 36.0–46.0)
HEMOGLOBIN: 15.9 g/dL — AB (ref 12.0–15.0)
MCH: 30.5 pg (ref 26.0–34.0)
MCHC: 34 g/dL (ref 30.0–36.0)
MCV: 89.6 fL (ref 78.0–100.0)
Platelets: 448 10*3/uL — ABNORMAL HIGH (ref 150–400)
RBC: 5.21 MIL/uL — AB (ref 3.87–5.11)
RDW: 13.5 % (ref 11.5–15.5)
WBC: 6.9 10*3/uL (ref 4.0–10.5)

## 2017-04-24 MED ORDER — DOXYCYCLINE HYCLATE 100 MG PO CAPS: 100.0000 mg | ORAL_CAPSULE | Freq: Two times a day (BID) | ORAL | 0 refills | Status: DC

## 2017-04-24 MED ORDER — TOBRAMYCIN 0.3 % OP OINT
TOPICAL_OINTMENT | Freq: Two times a day (BID) | OPHTHALMIC | Status: DC
Start: 2017-04-24 — End: 2017-04-25
  Administered 2017-04-24: 23:00:00 via OPHTHALMIC
  Filled 2017-04-24: qty 3.5

## 2017-04-24 MED ORDER — DOXYCYCLINE HYCLATE 100 MG PO TABS
100.0000 mg | ORAL_TABLET | Freq: Once | ORAL | Status: AC
  Administered 2017-04-24: 100 mg via ORAL
  Filled 2017-04-24: qty 1

## 2017-04-24 NOTE — ED Triage Notes (Signed)
[facility] recommended to go to ED for further eval of recurrent cough x 2 weeks (only at night) - resolved after finishing antibiotic last Friday but cough came back according to caregiver. Patient hx cerebral palsy, at baseline. Main concern is oliguria - caregiver reports decreased UO last couple of days. Denies fevers/chills.

## 2017-04-24 NOTE — ED Notes (Signed)
Phlegm suctioned at pt.'s oral cavity.

## 2017-04-24 NOTE — ED Provider Notes (Signed)
[facility] EMERGENCY DEPARTMENT Provider Note   CSN: [id number] Arrival date & time: 04/24/17  1508     History   Chief Complaint Chief Complaint  Patient presents with  . Cough  . Oliguria    HPI Amber Curry is a 27 y.o. female.  Patient is a 27 year old female with a history of cerebral palsy, seizures, wheelchair-bound and nonverbal presenting today with parents for worsening cough.  2 weeks ago patient developed a cough and congestion.  She saw her doctor and was placed on a course of amoxicillin.  She initially improved but several days ago started having worsening cough and congestion.  Mom states no fever or evidence of breathing difficulty.  They have been using albuterol intermittently at home.  She gets all of her feeds by G-tube and has been tolerating those without vomiting or diarrhea.   The history is provided by a caregiver and a parent.  Cough  This is a new problem. Episode onset: 2 weeks. The problem has been gradually worsening. The cough is productive of sputum. There has been no fever. Pertinent negatives include no chills and no shortness of breath. Associated symptoms comments: Congestion and lots of mucous.  Mild decrease urine outpt. Treatments tried: amoxicillin. Improvement on treatment: improvement for a few days and then return of sx. She is not a smoker. Past medical history comments: CP and sick contacts.    Past Medical History:  Diagnosis Date  . CP (cerebral palsy) (HCC)   . Seizures (HCC)   . Vision abnormalities     Patient Active Problem List   Diagnosis Date Noted  . Drooling 02/07/2014  . 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 Microcephalus (HCC) 01/31/2013  . 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 Long-term use of high-risk medication 01/31/2013      Past Surgical History:  Procedure Laterality Date  . IR GENERIC HISTORICAL  05/07/2016   IR CM INJ ANY COLONIC TUBE W/FLUORO 05/07/2016 Jolaine Click, MD [facility]  . IR GENERIC HISTORICAL  06/12/2016   IR REPLC DUODEN/JEJUNO TUBE PERCUT W/FLUORO 06/12/2016 Simonne Come, MD [facility]  . IR REPLC DUODEN/JEJUNO TUBE PERCUT W/FLUORO  03/12/2017  . REMOVAL OF GASTROSTOMY TUBE      OB History    No data available       Home Medications    Prior to Admission medications   Medication Sig Start Date End Date Taking? Authorizing Provider  AMBULATORY NON FORMULARY MEDICATION Medication Name: Glycopyrrolate 1mg  - compound to 1mg /80ml. Give 0.67ml every 8 hours (for drooling) 07/03/16   Elveria Rising, NP  AMBULATORY NON FORMULARY MEDICATION Medication Name: Baclofen Suspension 10mg /ml  Take 2 mL by mouth every morning, 1.5 mL at midday, at 1 mL at bedtime 02/04/17   Elveria Rising, NP  clotrimazole-betamethasone (LOTRISONE) cream  01/02/14   [provider]  ferrous sulfate 220 (44 FE) MG/5ML solution  12/14/13   [provider]  FLUOCINOLONE ACETONIDE SCALP 0.01 % OIL Apply topically at bedtime. 01/29/14   [provider]  Fluocinolone Acetonide Scalp 0.01 % OIL Apply once a week 12/06/15   [provider]  fluocinonide ointment (LIDEX) 0.05 %  01/06/14   [provider]  HYDROcodone-acetaminophen (HYCET) 7.5-325 mg/15 ml solution Take 15 mLs by mouth every 6 (six) hours as needed for moderate pain. 08/24/15   Lorre Nick, MD  lansoprazole (PREVACID SOLUTAB) 15 MG disintegrating tablet Give 1 tablet twice per day    [provider]  levETIRAcetam (KEPPRA) 100 MG/ML solution Give 5 ml by mouth twice per day 01/02/17   Elveria Rising, NP  medroxyPROGESTERone (DEPO-SUBQ PROVERA 104) 104 MG/0.65ML injection Inject 104 mg into the skin every 3 (three) months.    [provider]  MedroxyPROGESTERone Acetate 150 MG/ML SUSY  Inject 150 mg into the muscle every 3 (three) months. 04/02/16   [provider]  PHENObarbital 20 MG/5ML elixir GIVE 2 AND 1/2 TEASPOONFULS TWICE A DAY 04/21/17   Deetta Perla, MD  TEGRETOL 100 MG/5ML suspension TAKE 11 ML BY MOUTH 3 TIMES A DAY 04/15/17   Elveria Rising, NP    Family History No family history on file.  Social History Social History   Tobacco Use  . Smoking status: Never Smoker  . Smokeless tobacco: Never Used  Substance Use Topics  . Alcohol use: No  . Drug use: No     Allergies   Erythromycin   Review of Systems Review of Systems  Constitutional: Negative for chills.  Respiratory: Positive for cough. Negative for shortness of breath.   All other systems reviewed and are negative.    Physical Exam Updated Vital Signs BP 122/82 (BP Location: Left Arm)   Pulse (!) 112   Temp 98.5 F (36.9 C) (Oral)   Resp 18   Wt 56.7 kg (125 lb)   SpO2 93%   Physical Exam  Constitutional: She appears well-developed and well-nourished. No distress.  Mentally handicapped but in no acute distress  HENT:  Head: Normocephalic and atraumatic.  Mouth/Throat: Oropharynx is clear and moist.  Drooling and intermittently cough with thick mucous  Eyes: Conjunctivae and EOM are normal. Pupils are equal, round, and reactive to light.  Neck: Normal range of motion. Neck supple.  Cardiovascular: Regular rhythm and intact distal pulses. Tachycardia present.  No murmur heard. Pulmonary/Chest: Effort normal and breath sounds normal. No respiratory distress. She has no wheezes. She has no rales.  Coarse upper airway sounds bilaterally  Abdominal: Soft. She exhibits no distension. There is no tenderness. There is no rebound and no guarding.  g-tube present in the LUQ and around tube skin breakdown and some ulceration  Musculoskeletal: Normal range of motion. She exhibits no edema or tenderness.  Neurological: She is alert.  Skin: Skin is warm and dry. No rash  noted. No erythema.  Psychiatric:  Non-verbal  Nursing note and vitals reviewed.    ED Treatments / Results  Labs (all labs ordered are listed, but only abnormal results are displayed) Labs Reviewed  URINALYSIS, ROUTINE W REFLEX MICROSCOPIC - Abnormal; Notable for the following components:      Result Value   pH >9.0 (*)    Protein, ur 30 (*)    Leukocytes, UA TRACE (*)    All other components within normal limits  CBC - Abnormal; Notable for the following components:   RBC 5.21 (*)    Hemoglobin 15.9 (*)    HCT 46.7 (*)    Platelets 448 (*)    All other components within normal limits  COMPREHENSIVE METABOLIC PANEL - Abnormal; Notable for the following components:   Chloride 100 (*)    BUN 5 (*)    Creatinine, Ser 0.43 (*)    Calcium 8.5 (*)    Albumin 3.1 (*)    All other components within normal limits  URINALYSIS, MICROSCOPIC (REFLEX) - Abnormal; Notable for the following  components:   Bacteria, UA RARE (*)    Squamous Epithelial / LPF 0-5 (*)    All other components within normal limits    EKG  EKG Interpretation None       Radiology Dg Chest 1 View  Result Date: 04/24/2017 CLINICAL DATA:  Nocturnal cough over the last 2 weeks. EXAM: CHEST 1 VIEW COMPARISON:  08/24/2015. FINDINGS: Heart size is normal. The left lung is clear. There is patchy density in the right lower lobe consistent with mild pneumonia. No dense consolidation or lobar collapse. No effusions. IMPRESSION: Patchy right lower lobe pneumonia Electronically Signed   By: Paulina FusiMark  Shogry M.D.   On: 04/24/2017 19:32    Procedures Procedures (including critical care time)  Medications Ordered in ED Medications - No data to display   Initial Impression / Assessment and Plan / ED Course  I have reviewed the triage vital signs and the nursing notes.  Pertinent labs & imaging results that were available during my care of the patient were reviewed by me and considered in my medical decision making (see  chart for details).     Patient presenting with parents after worsening cough and congestion.  Patient has not had fever difficulty breathing per parents.  She was initially seen in urgent care and sent down for further evaluation.  Patient's labs are reassuring with a normal white count, renal function within normal limits and electrolytes within normal limits.  No evidence of UTI.  X-ray does show a right lower lobe pneumonia.  She did have a course of amoxicillin however feel that she may need broader coverage with doxycycline.  Here patient's oxygen saturations have been between 91 and 93%.  She appears in no acute distress.  Parents would like to attempt to take her home but if things worsen they will bring her back.  Recommended follow-up with her doctor on Sat/Monday.  Also mom will increase free water.  Final Clinical Impressions(s) / ED Diagnoses   Final diagnoses:  Community acquired pneumonia of right lower lobe of lung [facility](HCC)    ED Discharge Orders        Ordered    doxycycline (VIBRAMYCIN) 100 MG capsule  2 times daily     04/24/17 2227       Gwyneth SproutPlunkett, Aubre Quincy, MD 04/24/17 2228

## 2017-04-24 NOTE — ED Triage Notes (Signed)
Pt was seen 2 weeks ago. Reports still having phlegm at the back of the throat and coughing. No fever. She is also urinating but not as much as usual. She has been getting extra water through her feeding tube. Also she gets the depot shot and is having some vaginal spotting.

## 2017-04-24 NOTE — ED Notes (Signed)
Doxycycline given per G- tube.

## 2017-04-24 NOTE — ED Notes (Signed)
Dr. Anitra LauthPlunkett (EDP) explained tests results and plan of care to family.

## 2017-04-24 NOTE — ED Provider Notes (Signed)
[facility]    CSN: [id number] Arrival date & time: 04/24/17  1321     History   Chief Complaint Chief Complaint  Patient presents with  . Cough  . Urinary Retention  . Vaginal Bleeding    HPI Amber Curry is a 27 y.o. female.   27 year-old female, wheelchair bound due to cerebral palsy, seizure disorder, presenting today with her caretakers for multiple complaints. first complaint is of continued cough.  Patient was evaluated about 2 weeks ago by an outside facility for cough.  Was treated with amoxicillin, prednisone and albuterol at that time.  Caregivers are concerned that the patient is still coughing.  They also state that she has had decreased urine output over the past few days.  They deny any nausea or vomiting or diarrhea.  Patient receives her medications and nutrition through feeding tube.  Caregiver states that they have been giving her increased fluids but she has continued to have decreased urine output.  They are also complaining of vaginal spotting.  They state that the patient receives Depo injections every 3 months.  Last injection was in December.  She has been having intermittent vaginal spotting since January. Patient has had no fever, chills, vomiting, diarrhea    The history is provided by a caregiver.  Cough  Cough characteristics:  Non-productive Sputum characteristics:  Nondescript Severity:  Moderate Onset quality:  Gradual Duration:  2 weeks Timing:  Constant Progression:  Unchanged Chronicity:  New Smoker: no   Context: not animal exposure, not exposure to allergens and not fumes   Relieved by:  Nothing Worsened by:  Nothing Ineffective treatments:  None tried Associated symptoms: no chest pain, no chills, no diaphoresis, no ear fullness, no ear pain, no eye discharge, no fever, no myalgias, no rash, no rhinorrhea, no shortness of breath and no sore throat   Risk factors: no chemical exposure     Past Medical History:    Diagnosis Date  . CP (cerebral palsy) (HCC)   . Seizures (HCC)   . Vision abnormalities     Patient Active Problem List   Diagnosis Date Noted  . Drooling 02/07/2014  . 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 Microcephalus (HCC) 01/31/2013  . 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 Long-term use of high-risk medication 01/31/2013    Past Surgical History:  Procedure Laterality Date  . IR GENERIC HISTORICAL  05/07/2016   IR CM INJ ANY COLONIC TUBE W/FLUORO 05/07/2016 Jolaine ClickArthur Hoss, MD [facility]  . IR GENERIC HISTORICAL  06/12/2016   IR REPLC DUODEN/JEJUNO TUBE PERCUT W/FLUORO 06/12/2016 Simonne ComeJohn Watts, MD [facility]  . IR REPLC DUODEN/JEJUNO TUBE PERCUT W/FLUORO  03/12/2017  . REMOVAL OF GASTROSTOMY TUBE      OB History    No data available       Home Medications    Prior to Admission medications   Medication Sig Start Date End Date Taking? Authorizing Provider  AMBULATORY NON FORMULARY MEDICATION Medication Name: Glycopyrrolate 1mg  - compound to 1mg /561ml. Give 0.273ml every 8 hours (for drooling) 07/03/16   Elveria RisingGoodpasture, Tina, NP  AMBULATORY NON FORMULARY MEDICATION Medication Name: Baclofen Suspension 10mg /ml  Take 2 mL by mouth every morning, 1.5 mL at midday, at 1 mL at bedtime 02/04/17   Elveria RisingGoodpasture, Tina, NP  clotrimazole-betamethasone (LOTRISONE) cream  01/02/14   [provider]  ferrous sulfate 220 (44  FE) MG/5ML solution  12/14/13   [provider]  FLUOCINOLONE ACETONIDE SCALP 0.01 % OIL Apply topically at bedtime. 01/29/14   [provider]  Fluocinolone Acetonide Scalp 0.01 % OIL Apply once a week 12/06/15   [provider]  fluocinonide ointment (LIDEX) 0.05 %  01/06/14   [provider]  HYDROcodone-acetaminophen (HYCET) 7.5-325 mg/15 ml solution Take 15  mLs by mouth every 6 (six) hours as needed for moderate pain. 08/24/15   Lorre Nick, MD  lansoprazole (PREVACID SOLUTAB) 15 MG disintegrating tablet Give 1 tablet twice per day    [provider]  levETIRAcetam (KEPPRA) 100 MG/ML solution Give 5 ml by mouth twice per day 01/02/17   Elveria Rising, NP  medroxyPROGESTERone (DEPO-SUBQ PROVERA 104) 104 MG/0.65ML injection Inject 104 mg into the skin every 3 (three) months.    [provider]  MedroxyPROGESTERone Acetate 150 MG/ML SUSY Inject 150 mg into the muscle every 3 (three) months. 04/02/16   [provider]  PHENObarbital 20 MG/5ML elixir GIVE 2 AND 1/2 TEASPOONFULS TWICE A DAY 04/21/17   Deetta Perla, MD  TEGRETOL 100 MG/5ML suspension TAKE 11 ML BY MOUTH 3 TIMES A DAY 04/15/17   Elveria Rising, NP    Family History History reviewed. No pertinent family history.  Social History Social History   Tobacco Use  . Smoking status: Never Smoker  . Smokeless tobacco: Never Used  Substance Use Topics  . Alcohol use: No  . Drug use: No     Allergies   Erythromycin   Review of Systems Review of Systems  Constitutional: Negative for chills, diaphoresis and fever.  HENT: Negative for ear pain, rhinorrhea and sore throat.   Eyes: Negative for pain, discharge and visual disturbance.  Respiratory: Positive for cough. Negative for shortness of breath.   Cardiovascular: Negative for chest pain and palpitations.  Gastrointestinal: Negative for abdominal pain and vomiting.  Genitourinary: Positive for difficulty urinating and vaginal bleeding. Negative for dysuria and hematuria.  Musculoskeletal: Negative for arthralgias, back pain and myalgias.  Skin: Negative for color change and rash.  Neurological: Negative for seizures and syncope.  All other systems reviewed and are negative.    Physical Exam Triage Vital Signs ED Triage Vitals [04/24/17 1416]  Enc Vitals Group     BP      Pulse Rate 88      Resp 18     Temp 97.8 F (36.6 C)     Temp src      SpO2 99 %     Weight      Height      Head Circumference      Peak Flow      Pain Score      Pain Loc      Pain Edu?      Excl. in GC?    No data found.  Updated Vital Signs Pulse 88   Temp 97.8 F (36.6 C)   Resp 18   SpO2 99%   Visual Acuity Right Eye Distance:   Left Eye Distance:   Bilateral Distance:    Right Eye Near:   Left Eye Near:    Bilateral Near:     Physical Exam  Constitutional: She appears well-developed and well-nourished. No distress.  HENT:  Head: Normocephalic and atraumatic.  Eyes: Conjunctivae are normal.  Neck: Neck supple.  Cardiovascular: Normal rate and regular rhythm.  No murmur heard. Pulmonary/Chest: Effort normal. No respiratory distress. She has rhonchi in the  right upper field and the left upper field.  Abdominal: Soft. There is no tenderness.  Musculoskeletal: She exhibits no edema.  Neurological: She is alert.  Skin: Skin is warm and dry.  Psychiatric: She has a normal mood and affect.  Nursing note and vitals reviewed.    UC Treatments / Results  Labs (all labs ordered are listed, but only abnormal results are displayed) Labs Reviewed - No data to display  EKG  EKG Interpretation None       Radiology No results found.  Procedures Procedures (including critical care time)  Medications Ordered in UC Medications - No data to display   Initial Impression / Assessment and Plan / UC Course  I have reviewed the triage vital signs and the nursing notes.  Pertinent labs & imaging results that were available during my care of the patient were reviewed by me and considered in my medical decision making (see chart for details).     Patient brought in by her caretakers for multiple complaints.  They are concerned that the patient has had decreased urinary output.  Explained to them that the patient likely needs a chest x-ray, urinalysis and lab work.  They state  that the patient has had to be straight cathed in the past.  We are unable to perform catheterization in the urgent care.  Recommended they present to the urgent care for further eval  Final Clinical Impressions(s) / UC Diagnoses   Final diagnoses:  Cough  Urine output low    ED Discharge Orders    None       Controlled Substance Prescriptions [place] Controlled Substance Registry consulted? Not Applicable   Alecia Lemming, [place] 04/24/17 1452

## 2017-05-18 ENCOUNTER — Other Ambulatory Visit: Payer: Self-pay | Admitting: Pediatrics

## 2017-05-19 ENCOUNTER — Telehealth (INDEPENDENT_AMBULATORY_CARE_PROVIDER_SITE_OTHER): Payer: Self-pay | Admitting: Family

## 2017-05-19 NOTE — Telephone Encounter (Signed)
-----   Message from Elveria Risingina Goodpasture, NP sent at 05/19/2017 10:18 AM EST ----- Regarding: [facility]  Thanks,  Inetta Fermoina

## 2017-05-19 NOTE — Telephone Encounter (Signed)
Thank you. I will follow up.

## 2017-05-19 NOTE — Telephone Encounter (Signed)
Called patient's family and left voicemail asking that they call us back to schedule a f/u appt for her.

## 2017-05-20 NOTE — Telephone Encounter (Signed)
L/M requesting a call back so that we can schedule a follow up with Inetta Fermoina

## 2017-05-21 ENCOUNTER — Ambulatory Visit (INDEPENDENT_AMBULATORY_CARE_PROVIDER_SITE_OTHER): Payer: Medicare Other | Admitting: Family

## 2017-05-21 ENCOUNTER — Encounter (INDEPENDENT_AMBULATORY_CARE_PROVIDER_SITE_OTHER): Payer: Self-pay | Admitting: Family

## 2017-05-21 VITALS — BP 100/64 | HR 92

## 2017-05-21 DIAGNOSIS — Z931 Gastrostomy status: Secondary | ICD-10-CM | POA: Diagnosis not present

## 2017-05-21 DIAGNOSIS — K117 Disturbances of salivary secretion: Secondary | ICD-10-CM

## 2017-05-21 DIAGNOSIS — G808 Other cerebral palsy: Secondary | ICD-10-CM

## 2017-05-21 DIAGNOSIS — R1314 Dysphagia, pharyngoesophageal phase: Secondary | ICD-10-CM

## 2017-05-21 DIAGNOSIS — Q02 Microcephaly: Secondary | ICD-10-CM

## 2017-05-21 DIAGNOSIS — H47619 Cortical blindness, unspecified side of brain: Secondary | ICD-10-CM

## 2017-05-21 DIAGNOSIS — G40319 Generalized idiopathic epilepsy and epileptic syndromes, intractable, without status epilepticus: Secondary | ICD-10-CM | POA: Diagnosis not present

## 2017-05-21 DIAGNOSIS — F72 Severe intellectual disabilities: Secondary | ICD-10-CM

## 2017-05-21 MED ORDER — AMBULATORY NON FORMULARY MEDICATION: 3 refills | Status: AC

## 2017-05-21 MED ORDER — TEGRETOL 100 MG/5ML PO SUSP: ORAL | 3 refills | Status: DC

## 2017-05-21 MED ORDER — LEVETIRACETAM 100 MG/ML PO SOLN: ORAL | 3 refills | Status: AC

## 2017-05-21 MED ORDER — PHENOBARBITAL 20 MG/5ML PO ELIX: ORAL_SOLUTION | ORAL | 3 refills | Status: DC

## 2017-05-21 NOTE — Progress Notes (Signed)
Patient: Amber Curry MRN: [id number] Sex: female DOB: 05/25/1990  Provider: Elveria Rising, NP Location of Care: [facility]  Note type: Routine return visit  History of Present Illness: Referral Source: Rocky Morel, MD History from: [facility] chart and her caregiver Chief Complaint: Epilepsy/Congenital Quadriplegia  SHIASIA PORRO is a 27 y.o. 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 Shenee has residuals of severe spastic quadriparesis, mixed seizure types, severe dysphagia, cortical blindness and profound intellectual disability. She was last seen April 17, 2016. At that time her mother and caregiver reported that she was experiencing an increase in seizure activity between 5:30 and 6:30 AM. An EEG was performed on April 23, 2016 that was abnormal with diffuse slowing consistent with static encephalopathy. There was also a clinical and electrographic seizure during the recording.  Levetiracetam was added to her regimen at that time and Mom called back later to report that her seizures had improved. Today Amber Curry's caregiver reports that she has had increase in seizures in the early morning, between 5 and 7AM several times per week for the last couple of months. Amber Curry is taking and tolerating Levetiracetam, Phenobarbital and Tegretol for her seizure disorder. Her caregiver denies any missed doses. With the seizures, Amber Curry cries out, her arm extends, her body stiffens and she has jerking movements for about 1 minute.   Amber Curry has been otherwise healthy since she was last seen. Her caregiver has no other concerns for her today other than previously mentioned.  Review of Systems: Please see the HPI for neurologic and other pertinent review of systems. Otherwise, all other systems were reviewed and  were negative.    Past Medical History:  Diagnosis Date  . CP (cerebral palsy) (HCC)   . Seizures (HCC)   . Vision abnormalities    Hospitalizations: Yes.  , Head Injury: No., Nervous System Infections: No., Immunizations up to date: No. Past Medical History Comments: She was born at [redacted] weeks gestation with good Apgars but severe metabolic acidosis, decline in hemoglobin, hypokalemia, and onset of seizures. She developed Grade 3 intracranial hemorrhage in the right caudate with subarachnoid blood and had severe posthemorrhagic hydrocephalus. She developed widespread cortical damage and initially had difficult to control seizures. She had bilateral hip dislocation repaired by Dr Remonia Richter. CT of the brain 07/16/89 with generalized cerebral atrophy and shunted hydrocephalus. EEG 11/29/93 showing diffuse background slowing and disorganization, 3 independent seizure foci without electrographic seizures.  EEG performed 04/23/2016 was abnormal record with the patient awake.  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  The patient had a clinical and electrographic seizure during the recording. Levetiracetam was added to her medication regimen.   Surgical History Past Surgical History:  Procedure Laterality Date  . IR GENERIC HISTORICAL  05/07/2016   IR CM INJ ANY COLONIC TUBE W/FLUORO 05/07/2016 Jolaine Click, MD [facility]  . IR GENERIC HISTORICAL  06/12/2016   IR REPLC DUODEN/JEJUNO TUBE PERCUT W/FLUORO 06/12/2016 Simonne Come, MD [facility]  . IR REPLC DUODEN/JEJUNO TUBE PERCUT W/FLUORO  03/12/2017  . REMOVAL OF GASTROSTOMY TUBE      Family History family history is not on file. Family History is otherwise negative for migraines, seizures, cognitive impairment, blindness, deafness, birth defects, chromosomal disorder, autism.  Social History Social History   Socioeconomic History  . Marital status: Single    Spouse name:  None  . Number of  children: None  . Years of education: None  . Highest education level: None  Social Needs  . Financial resource strain: None  . Food insecurity - worry: None  . Food insecurity - inability: None  . Transportation needs - medical: None  . Transportation needs - non-medical: None  Occupational History  . None  Tobacco Use  . Smoking status: Never Smoker  . Smokeless tobacco: Never Used  Substance and Sexual Activity  . Alcohol use: No  . Drug use: No  . Sexual activity: No  Other Topics Concern  . None  Social History Narrative   Kinzee stays at home and receives care from her mother and a home health aid.     Allergies Allergies  Allergen Reactions  . Erythromycin Diarrhea  . Azithromycin Diarrhea    Physical Exam BP 100/64   Pulse 92  General: small statured female, seated in wheelchair, in no evident distress; black hair, brown eyes, non-handed. Head: microcephalic and atraumatic. Oropharynx appears benign but examination of her pharynx is difficult due to her inability to cooperate. Neck: supple with no carotid bruits.  Cardiovascular: regular rate and rhythm, no murmurs. Respiratory: Clear to auscultation bilaterally Abdomen: Bowel sounds present all four quadrants, abdomen soft, non-tender, non-distended. No hepatosplenomegaly or masses palpated. She has a clamped g-tube in place. Musculoskeletal: Bilateral spastic quadriparesis with contractures in arms and legs Skin: no rashes or neurocutaneous lesions  Neurologic Exam Mental Status: Profound intellectual disability. Does not open eyes or appear to notice the examiner. Has no language. Unable to follow any commands. Cranial Nerves: Unable to fully visualize fundus.  Sluggish non-reactive pupils.  Dysconjugate eye movements. No reaction to visual stimuli. She startles to some sounds but not consistently. She has bilateral facial weakness with drooling.  Minimal head control. Motor: Spastic quadriparesis. Unable to  adequately test strength but does resist movement of the extremities by the examiner. Sensory: Withdrawal x 4 Coordination: Unable to adequately assess due to inability to cooperate Gait and Station: Unable to stand or bear weight Reflexes: Absent and symmetric. Toes neutral. No clonus.  Impression 1.  Congenital spastic quadriparesis 2.  Generalized nonconvulsive epilepsy, intractable 3.  Microcephalus 4.  Severe intellectual delay 5.  Cortical blindness 6. Severe dysphagia requiring g-tube feedings 7. Gastrostomy dependent  Recommendations for plan of care [facility]. Amber ApleyShnee has neither had nor required imaging or lab studies since the last visit. 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 She has residuals of spastic quadriparesis, mixed seizure types that are intractable, severe dysphagia with g-tube feedings, cortical blindness and severe intellectual delay.She is taking and tolerating Levetiracetam, Phenobarbital and Tegretol for her seizure disorder but continues to have early morning seizures several times per week. I talked with the caregiver and recommended that the Levetiracetam dose increase. I asked her to call me in 2 weeks to let me know how Amber ApleyShnee is doing. If she continues to have seizures, we can increase the dose further. I will otherwise see her back in follow up in 1 year or sooner if needed. The caregiver agreed with the plans made today.  The medication list was reviewed and reconciled. I reviewed changes that were made in the prescribed medications today.  A complete medication list was provided to the patient's caregiver.  Allergies as of 05/21/2017      Reactions   Erythromycin Diarrhea   Azithromycin Diarrhea      Medication List         Accurate as of 05/21/17  2:24 PM. Always use your most recent med list.          AMBULATORY NON FORMULARY MEDICATION Medication Name: Glycopyrrolate 1mg  - compound to 1mg /40ml. Give 0.5ml every 8 hours (for drooling)   AMBULATORY NON FORMULARY MEDICATION Medication Name: Baclofen Suspension 10mg /ml  Take 2 mL by mouth every morning, 1.5 mL at midday, at 1 mL at bedtime   clotrimazole-betamethasone cream Commonly known as:  LOTRISONE   medroxyPROGESTERone Acetate 150 MG/ML Susy Inject 150 mg into the muscle every 3 (three) months.   DEPO-SUBQ PROVERA 104 104 MG/0.65ML injection Generic drug:  medroxyPROGESTERone Inject 104 mg into the skin every 3 (three) months.   doxycycline 100 MG capsule Commonly known as:  VIBRAMYCIN Take 1 capsule (100 mg total) by mouth 2 (two) times daily.   ferrous sulfate 220 (44 Fe) MG/5ML solution   Fluocinolone Acetonide Scalp 0.01 % Oil Apply topically at bedtime.   Fluocinolone Acetonide Scalp 0.01 % Oil Apply once a week   fluocinonide ointment 0.05 % Commonly known as:  LIDEX   HYDROcodone-acetaminophen 7.5-325 mg/15 ml solution Commonly known as:  HYCET Take 15 mLs by mouth every 6 (six) hours as needed for moderate pain.   lansoprazole 15 MG disintegrating tablet Commonly known as:  PREVACID SOLUTAB Give 1 tablet twice per day   levETIRAcetam 100 MG/ML solution Commonly known as:  KEPPRA Give 5 ml by mouth twice per day   PHENObarbital 20 MG/5ML elixir GIVE 2 AND 1/2 TEASPOONFULS TWICE A DAY   TEGRETOL 100 MG/5ML suspension Generic drug:  carBAMazepine TAKE 11 ML BY MOUTH 3 TIMES A DAY       Dr. Sharene Skeans was consulted regarding the patient.   Total time spent with the patient was 25 minutes, of which 50% or more was spent in counseling and coordination of care.   Elveria Rising NP-C

## 2017-05-21 NOTE — Patient Instructions (Signed)
Thank you for coming in today.   Instructions for you until your next appointment are as follows: 1. For Amber Curry's early morning seizures, we will increase the Levetiracetam dose to 5ml in the morning and 7.585ml at night.  2. Continue her other medications as you have been giving them. I sent the Levetiracetam and Tegretol prescriptions to [facility] and the others to [facility]. 3. Call me in 2 weeks to let know if the seizures are continuing. 4. Please sign up for MyChart if you have not done so 5. Please plan to return for follow up in one year or sooner if needed.

## 2017-05-21 NOTE — Telephone Encounter (Signed)
Patient is coming in today.

## 2017-05-22 ENCOUNTER — Encounter (INDEPENDENT_AMBULATORY_CARE_PROVIDER_SITE_OTHER): Payer: Self-pay | Admitting: Family

## 2017-05-22 DIAGNOSIS — Z931 Gastrostomy status: Secondary | ICD-10-CM | POA: Insufficient documentation

## 2017-05-22 DIAGNOSIS — R1314 Dysphagia, pharyngoesophageal phase: Secondary | ICD-10-CM | POA: Insufficient documentation

## 2017-05-25 ENCOUNTER — Other Ambulatory Visit: Payer: Self-pay | Admitting: Pediatrics

## 2017-05-25 DIAGNOSIS — G40319 Generalized idiopathic epilepsy and epileptic syndromes, intractable, without status epilepticus: Secondary | ICD-10-CM

## 2017-06-19 ENCOUNTER — Other Ambulatory Visit (INDEPENDENT_AMBULATORY_CARE_PROVIDER_SITE_OTHER): Payer: Self-pay | Admitting: Family

## 2017-06-19 DIAGNOSIS — G40319 Generalized idiopathic epilepsy and epileptic syndromes, intractable, without status epilepticus: Secondary | ICD-10-CM

## 2017-08-05 ENCOUNTER — Other Ambulatory Visit (INDEPENDENT_AMBULATORY_CARE_PROVIDER_SITE_OTHER): Payer: Self-pay | Admitting: Family

## 2017-08-05 DIAGNOSIS — G40319 Generalized idiopathic epilepsy and epileptic syndromes, intractable, without status epilepticus: Secondary | ICD-10-CM

## 2017-11-16 DIAGNOSIS — A419 Sepsis, unspecified organism: Secondary | ICD-10-CM

## 2017-11-16 HISTORY — DX: Sepsis, unspecified organism: A41.9

## 2017-12-01 ENCOUNTER — Other Ambulatory Visit (HOSPITAL_COMMUNITY): Payer: Self-pay | Admitting: Interventional Radiology

## 2017-12-01 DIAGNOSIS — K9423 Gastrostomy malfunction: Secondary | ICD-10-CM

## 2017-12-02 ENCOUNTER — Encounter (HOSPITAL_COMMUNITY): Payer: Self-pay | Admitting: Interventional Radiology

## 2017-12-02 ENCOUNTER — Ambulatory Visit (HOSPITAL_COMMUNITY)
Admission: RE | Admit: 2017-12-02 | Discharge: 2017-12-02 | Disposition: A | Discharge status: Home / Self Care | Payer: Medicare Other | Source: Ambulatory Visit | Attending: Interventional Radiology | Admitting: Interventional Radiology

## 2017-12-02 ENCOUNTER — Other Ambulatory Visit (HOSPITAL_COMMUNITY): Payer: Self-pay | Admitting: Interventional Radiology

## 2017-12-02 DIAGNOSIS — K9413 Enterostomy malfunction: Secondary | ICD-10-CM

## 2017-12-02 DIAGNOSIS — R1319 Other dysphagia: Secondary | ICD-10-CM

## 2017-12-02 DIAGNOSIS — K9423 Gastrostomy malfunction: Secondary | ICD-10-CM

## 2017-12-02 DIAGNOSIS — A419 Sepsis, unspecified organism: Secondary | ICD-10-CM | POA: Diagnosis not present

## 2017-12-02 DIAGNOSIS — E860 Dehydration: Secondary | ICD-10-CM | POA: Diagnosis not present

## 2017-12-02 DIAGNOSIS — Y733 Surgical instruments, materials and gastroenterology and urology devices (including sutures) associated with adverse incidents: Secondary | ICD-10-CM | POA: Insufficient documentation

## 2017-12-02 HISTORY — PX: IR REPLC DUODEN/JEJUNO TUBE PERCUT W/FLUORO: IMG2334

## 2017-12-02 MED ORDER — IOPAMIDOL (ISOVUE-300) INJECTION 61%
INTRAVENOUS | Status: AC
  Administered 2017-12-02: 15 mL
  Filled 2017-12-02: qty 50

## 2017-12-02 MED ORDER — LIDOCAINE VISCOUS HCL 2 % MT SOLN
OROMUCOSAL | Status: AC | PRN
  Administered 2017-12-02: 15 mL via OROMUCOSAL

## 2017-12-02 MED ORDER — LIDOCAINE VISCOUS HCL 2 % MT SOLN
OROMUCOSAL | Status: AC
  Filled 2017-12-02: qty 15

## 2017-12-02 NOTE — Procedures (Signed)
Interventional Radiology Procedure Note  Procedure: Routine exchange of percutaneous gastro-jejunostomy, functioning as a jejunostomy.  62F balloon retention jejunostomy tube placed.  .  Complications: None  Recommendations:  - Ok to use - DC now  Medical illustratorigned,  Yvone NeuJaime S. Loreta AveWagner, DO

## 2017-12-03 ENCOUNTER — Emergency Department (HOSPITAL_COMMUNITY): Payer: Medicare Other

## 2017-12-03 ENCOUNTER — Inpatient Hospital Stay: Payer: Self-pay

## 2017-12-03 ENCOUNTER — Encounter (HOSPITAL_COMMUNITY): Payer: Self-pay

## 2017-12-03 ENCOUNTER — Inpatient Hospital Stay (HOSPITAL_COMMUNITY)
Admission: EM | Admit: 2017-12-03 | Discharge: 2018-01-16 | DRG: 870 | Disposition: E | Payer: Medicare Other | Attending: Pulmonary Disease | Admitting: Pulmonary Disease

## 2017-12-03 DIAGNOSIS — R1314 Dysphagia, pharyngoesophageal phase: Secondary | ICD-10-CM | POA: Diagnosis present

## 2017-12-03 DIAGNOSIS — Z982 Presence of cerebrospinal fluid drainage device: Secondary | ICD-10-CM

## 2017-12-03 DIAGNOSIS — J9600 Acute respiratory failure, unspecified whether with hypoxia or hypercapnia: Secondary | ICD-10-CM | POA: Diagnosis not present

## 2017-12-03 DIAGNOSIS — K228 Other specified diseases of esophagus: Secondary | ICD-10-CM | POA: Diagnosis not present

## 2017-12-03 DIAGNOSIS — I469 Cardiac arrest, cause unspecified: Secondary | ICD-10-CM

## 2017-12-03 DIAGNOSIS — Y95 Nosocomial condition: Secondary | ICD-10-CM | POA: Diagnosis present

## 2017-12-03 DIAGNOSIS — H47619 Cortical blindness, unspecified side of brain: Secondary | ICD-10-CM | POA: Diagnosis present

## 2017-12-03 DIAGNOSIS — E860 Dehydration: Secondary | ICD-10-CM | POA: Diagnosis present

## 2017-12-03 DIAGNOSIS — R570 Cardiogenic shock: Secondary | ICD-10-CM | POA: Diagnosis not present

## 2017-12-03 DIAGNOSIS — E876 Hypokalemia: Secondary | ICD-10-CM | POA: Diagnosis present

## 2017-12-03 DIAGNOSIS — G800 Spastic quadriplegic cerebral palsy: Secondary | ICD-10-CM | POA: Diagnosis present

## 2017-12-03 DIAGNOSIS — Z793 Long term (current) use of hormonal contraceptives: Secondary | ICD-10-CM

## 2017-12-03 DIAGNOSIS — J9811 Atelectasis: Secondary | ICD-10-CM | POA: Diagnosis not present

## 2017-12-03 DIAGNOSIS — R509 Fever, unspecified: Secondary | ICD-10-CM

## 2017-12-03 DIAGNOSIS — G931 Anoxic brain damage, not elsewhere classified: Secondary | ICD-10-CM | POA: Diagnosis present

## 2017-12-03 DIAGNOSIS — S3639XA Other injury of stomach, initial encounter: Secondary | ICD-10-CM | POA: Diagnosis present

## 2017-12-03 DIAGNOSIS — Z7189 Other specified counseling: Secondary | ICD-10-CM

## 2017-12-03 DIAGNOSIS — D696 Thrombocytopenia, unspecified: Secondary | ICD-10-CM | POA: Diagnosis not present

## 2017-12-03 DIAGNOSIS — R6520 Severe sepsis without septic shock: Secondary | ICD-10-CM | POA: Diagnosis present

## 2017-12-03 DIAGNOSIS — Z66 Do not resuscitate: Secondary | ICD-10-CM | POA: Diagnosis present

## 2017-12-03 DIAGNOSIS — Q040 Congenital malformations of corpus callosum: Secondary | ICD-10-CM | POA: Diagnosis not present

## 2017-12-03 DIAGNOSIS — K9413 Enterostomy malfunction: Secondary | ICD-10-CM

## 2017-12-03 DIAGNOSIS — F72 Severe intellectual disabilities: Secondary | ICD-10-CM | POA: Diagnosis present

## 2017-12-03 DIAGNOSIS — R319 Hematuria, unspecified: Secondary | ICD-10-CM | POA: Diagnosis present

## 2017-12-03 DIAGNOSIS — G40319 Generalized idiopathic epilepsy and epileptic syndromes, intractable, without status epilepticus: Secondary | ICD-10-CM | POA: Diagnosis present

## 2017-12-03 DIAGNOSIS — Z515 Encounter for palliative care: Secondary | ICD-10-CM

## 2017-12-03 DIAGNOSIS — J9601 Acute respiratory failure with hypoxia: Secondary | ICD-10-CM | POA: Diagnosis present

## 2017-12-03 DIAGNOSIS — G40419 Other generalized epilepsy and epileptic syndromes, intractable, without status epilepticus: Secondary | ICD-10-CM | POA: Diagnosis present

## 2017-12-03 DIAGNOSIS — A419 Sepsis, unspecified organism: Principal | ICD-10-CM

## 2017-12-03 DIAGNOSIS — G808 Other cerebral palsy: Secondary | ICD-10-CM | POA: Diagnosis present

## 2017-12-03 DIAGNOSIS — Z79899 Other long term (current) drug therapy: Secondary | ICD-10-CM | POA: Diagnosis not present

## 2017-12-03 DIAGNOSIS — X58XXXA Exposure to other specified factors, initial encounter: Secondary | ICD-10-CM | POA: Diagnosis not present

## 2017-12-03 DIAGNOSIS — Z978 Presence of other specified devices: Secondary | ICD-10-CM

## 2017-12-03 DIAGNOSIS — Z9911 Dependence on respirator [ventilator] status: Secondary | ICD-10-CM

## 2017-12-03 DIAGNOSIS — J9690 Respiratory failure, unspecified, unspecified whether with hypoxia or hypercapnia: Secondary | ICD-10-CM

## 2017-12-03 DIAGNOSIS — T884XXA Failed or difficult intubation, initial encounter: Secondary | ICD-10-CM

## 2017-12-03 DIAGNOSIS — R109 Unspecified abdominal pain: Secondary | ICD-10-CM

## 2017-12-03 DIAGNOSIS — J189 Pneumonia, unspecified organism: Secondary | ICD-10-CM

## 2017-12-03 DIAGNOSIS — Z881 Allergy status to other antibiotic agents status: Secondary | ICD-10-CM

## 2017-12-03 DIAGNOSIS — Q02 Microcephaly: Secondary | ICD-10-CM | POA: Diagnosis not present

## 2017-12-03 DIAGNOSIS — R4020 Unspecified coma: Secondary | ICD-10-CM | POA: Diagnosis not present

## 2017-12-03 DIAGNOSIS — Z8701 Personal history of pneumonia (recurrent): Secondary | ICD-10-CM | POA: Diagnosis not present

## 2017-12-03 DIAGNOSIS — R1310 Dysphagia, unspecified: Secondary | ICD-10-CM | POA: Diagnosis not present

## 2017-12-03 DIAGNOSIS — Z934 Other artificial openings of gastrointestinal tract status: Secondary | ICD-10-CM | POA: Diagnosis not present

## 2017-12-03 DIAGNOSIS — Z931 Gastrostomy status: Secondary | ICD-10-CM

## 2017-12-03 DIAGNOSIS — F73 Profound intellectual disabilities: Secondary | ICD-10-CM | POA: Diagnosis present

## 2017-12-03 DIAGNOSIS — J690 Pneumonitis due to inhalation of food and vomit: Secondary | ICD-10-CM | POA: Diagnosis present

## 2017-12-03 DIAGNOSIS — J181 Lobar pneumonia, unspecified organism: Secondary | ICD-10-CM | POA: Diagnosis not present

## 2017-12-03 DIAGNOSIS — G809 Cerebral palsy, unspecified: Secondary | ICD-10-CM

## 2017-12-03 DIAGNOSIS — K219 Gastro-esophageal reflux disease without esophagitis: Secondary | ICD-10-CM | POA: Diagnosis present

## 2017-12-03 HISTORY — PX: IR CM INJ ANY COLONIC TUBE W/FLUORO: IMG2336

## 2017-12-03 HISTORY — DX: Sepsis, unspecified organism: A41.9

## 2017-12-03 LAB — I-STAT BETA HCG BLOOD, ED (MC, WL, AP ONLY): I-stat hCG, quantitative: <5 m[IU]/mL (ref <5)

## 2017-12-03 LAB — COMPREHENSIVE METABOLIC PANEL
ALT: 49 U/L — ABNORMAL HIGH (ref 0–44)
ANION GAP: 16 — AB (ref 5–15)
AST: 43 U/L — AB (ref 15–41)
Albumin: 2.9 g/dL — ABNORMAL LOW (ref 3.5–5.0)
Alkaline Phosphatase: 65 U/L (ref 38–126)
BUN: 8 mg/dL (ref 6–20)
CHLORIDE: 104 mmol/L (ref 98–111)
CO2: 17 mmol/L — ABNORMAL LOW (ref 22–32)
Calcium: 8.4 mg/dL — ABNORMAL LOW (ref 8.9–10.3)
Creatinine, Ser: 0.69 mg/dL (ref 0.44–1.00)
Glucose, Bld: 132 mg/dL — ABNORMAL HIGH (ref 70–99)
POTASSIUM: 3.2 mmol/L — AB (ref 3.5–5.1)
Sodium: 137 mmol/L (ref 135–145)
Total Bilirubin: 0.7 mg/dL (ref 0.3–1.2)
Total Protein: 6.1 g/dL — ABNORMAL LOW (ref 6.5–8.1)

## 2017-12-03 LAB — I-STAT CG4 LACTIC ACID, ED
LACTIC ACID, VENOUS: 5.58 mmol/L — AB (ref 0.5–1.9)
LACTIC ACID, VENOUS: 4.91 mmol/L — AB (ref 0.5–1.9)

## 2017-12-03 LAB — URINALYSIS, ROUTINE W REFLEX MICROSCOPIC
GLUCOSE, UA: NEGATIVE mg/dL
KETONES UR: NEGATIVE mg/dL
LEUKOCYTES UA: NEGATIVE
NITRITE: NEGATIVE
PH: 5 (ref 5.0–8.0)
Protein, ur: 100 mg/dL — AB
Specific Gravity, Urine: 1.04 — ABNORMAL HIGH (ref 1.005–1.030)

## 2017-12-03 LAB — CBC WITH DIFFERENTIAL/PLATELET
Abs Immature Granulocytes: 0 10*3/uL (ref 0.0–0.1)
BASOS ABS: 0 10*3/uL (ref 0.0–0.1)
Basophils Relative: 1 %
Eosinophils Absolute: 0.1 10*3/uL (ref 0.0–0.7)
Eosinophils Relative: 3 %
HEMATOCRIT: 53.3 % — AB (ref 36.0–46.0)
HEMOGLOBIN: 17.3 g/dL — AB (ref 12.0–15.0)
IMMATURE GRANULOCYTES: 0 %
LYMPHS ABS: 0.4 10*3/uL — AB (ref 0.7–4.0)
LYMPHS PCT: 10 %
MCH: 29.6 pg (ref 26.0–34.0)
MCHC: 32.5 g/dL (ref 30.0–36.0)
MCV: 91.3 fL (ref 78.0–100.0)
Monocytes Absolute: 0.3 10*3/uL (ref 0.1–1.0)
Monocytes Relative: 8 %
NEUTROS PCT: 78 %
Neutro Abs: 3.2 10*3/uL (ref 1.7–7.7)
Platelets: 302 10*3/uL (ref 150–400)
RBC: 5.84 MIL/uL — AB (ref 3.87–5.11)
RDW: 13.3 % (ref 11.5–15.5)
WBC: 4 10*3/uL (ref 4.0–10.5)

## 2017-12-03 LAB — LACTIC ACID, PLASMA: LACTIC ACID, VENOUS: 3.5 mmol/L — AB (ref 0.5–1.9)

## 2017-12-03 MED ORDER — IOPAMIDOL (ISOVUE-300) INJECTION 61%
INTRAVENOUS | Status: AC
  Administered 2017-12-03: 20 mL
  Filled 2017-12-03: qty 50

## 2017-12-03 MED ORDER — VANCOMYCIN HCL IN DEXTROSE 750-5 MG/150ML-% IV SOLN: 750.0000 mg | Freq: Two times a day (BID) | INTRAVENOUS | Status: DC

## 2017-12-03 MED ORDER — ALBUTEROL SULFATE (2.5 MG/3ML) 0.083% IN NEBU: 2.5000 mg | INHALATION_SOLUTION | RESPIRATORY_TRACT | Status: DC | PRN

## 2017-12-03 MED ORDER — PANTOPRAZOLE SODIUM 40 MG PO TBEC: 40.0000 mg | DELAYED_RELEASE_TABLET | Freq: Every day | ORAL | Status: DC

## 2017-12-03 MED ORDER — ACETAMINOPHEN 650 MG RE SUPP
650.0000 mg | Freq: Four times a day (QID) | RECTAL | Status: DC | PRN
  Administered 2017-12-05: 650 mg via RECTAL
  Filled 2017-12-03 (×2): qty 1

## 2017-12-03 MED ORDER — VANCOMYCIN HCL IN DEXTROSE 1-5 GM/200ML-% IV SOLN
1000.0000 mg | Freq: Once | INTRAVENOUS | Status: AC
  Administered 2017-12-03: 1000 mg via INTRAVENOUS
  Filled 2017-12-03: qty 200

## 2017-12-03 MED ORDER — LEVETIRACETAM 100 MG/ML PO SOLN: 500.0000 mg | ORAL | Status: DC

## 2017-12-03 MED ORDER — HYDROCODONE-ACETAMINOPHEN 7.5-325 MG/15ML PO SOLN
15.0000 mL | Freq: Four times a day (QID) | ORAL | Status: DC | PRN
  Administered 2017-12-03 – 2017-12-04 (×4): 15 mL via ORAL
  Filled 2017-12-03 (×5): qty 15

## 2017-12-03 MED ORDER — PANTOPRAZOLE SODIUM 40 MG PO PACK
40.0000 mg | PACK | Freq: Every day | ORAL | Status: DC
  Administered 2017-12-03 – 2017-12-05 (×3): 40 mg
  Filled 2017-12-03 (×3): qty 20

## 2017-12-03 MED ORDER — LACTATED RINGERS IV SOLN
INTRAVENOUS | Status: AC
  Administered 2017-12-03 – 2017-12-04 (×4): via INTRAVENOUS

## 2017-12-03 MED ORDER — BACLOFEN 20 MG PO TABS
20.0000 mg | ORAL_TABLET | Freq: Every day | ORAL | Status: DC
  Administered 2017-12-04 – 2017-12-16 (×12): 20 mg
  Filled 2017-12-03 (×13): qty 1

## 2017-12-03 MED ORDER — BACLOFEN 10 MG PO TABS
10.0000 mg | ORAL_TABLET | Freq: Every day | ORAL | Status: DC
  Administered 2017-12-03 – 2017-12-15 (×13): 10 mg
  Filled 2017-12-03 (×14): qty 1

## 2017-12-03 MED ORDER — IOPAMIDOL (ISOVUE-M 200) INJECTION 41%
INTRAMUSCULAR | Status: AC
  Administered 2017-12-03: 20 mL via GASTROSTOMY
  Filled 2017-12-03: qty 10

## 2017-12-03 MED ORDER — ONDANSETRON HCL 4 MG/2ML IJ SOLN
4.0000 mg | Freq: Four times a day (QID) | INTRAMUSCULAR | Status: DC | PRN
  Administered 2017-12-15 – 2017-12-16 (×2): 4 mg via INTRAVENOUS
  Filled 2017-12-03 (×2): qty 2

## 2017-12-03 MED ORDER — SODIUM CHLORIDE 0.9 % IV BOLUS (SEPSIS)
1000.0000 mL | Freq: Once | INTRAVENOUS | Status: AC
  Administered 2017-12-03: 1000 mL via INTRAVENOUS

## 2017-12-03 MED ORDER — POTASSIUM CHLORIDE 20 MEQ/15ML (10%) PO SOLN
40.0000 meq | Freq: Two times a day (BID) | ORAL | Status: AC
  Administered 2017-12-03 (×2): 40 meq
  Filled 2017-12-03 (×2): qty 30

## 2017-12-03 MED ORDER — IOPAMIDOL (ISOVUE-300) INJECTION 61%
INTRAVENOUS | Status: AC
  Administered 2017-12-03: 50 mL
  Filled 2017-12-03: qty 50

## 2017-12-03 MED ORDER — PHENOBARBITAL 20 MG/5ML PO ELIX
50.0000 mg | ORAL_SOLUTION | Freq: Two times a day (BID) | ORAL | Status: DC
  Administered 2017-12-03: 60 mg
  Administered 2017-12-03 – 2017-12-05 (×5): 50 mg
  Filled 2017-12-03 (×6): qty 15

## 2017-12-03 MED ORDER — SODIUM CHLORIDE 0.9% FLUSH
10.0000 mL | Freq: Two times a day (BID) | INTRAVENOUS | Status: DC
  Administered 2017-12-03: 20 mL
  Administered 2017-12-04 – 2017-12-16 (×19): 10 mL

## 2017-12-03 MED ORDER — PRO-STAT SUGAR FREE PO LIQD
30.0000 mL | Freq: Every day | ORAL | Status: DC
  Administered 2017-12-03 – 2017-12-04 (×2): 30 mL
  Filled 2017-12-03 (×2): qty 30

## 2017-12-03 MED ORDER — METRONIDAZOLE IN NACL 5-0.79 MG/ML-% IV SOLN
500.0000 mg | Freq: Three times a day (TID) | INTRAVENOUS | Status: DC
  Administered 2017-12-03 – 2017-12-05 (×7): 500 mg via INTRAVENOUS
  Filled 2017-12-03 (×9): qty 100

## 2017-12-03 MED ORDER — CARBAMAZEPINE 100 MG/5ML PO SUSP
220.0000 mg | Freq: Two times a day (BID) | ORAL | Status: DC
  Administered 2017-12-03 – 2017-12-16 (×26): 220 mg
  Filled 2017-12-03 (×28): qty 15

## 2017-12-03 MED ORDER — SODIUM CHLORIDE 0.9 % IV SOLN
2.0000 g | Freq: Once | INTRAVENOUS | Status: AC
  Administered 2017-12-03: 2 g via INTRAVENOUS
  Filled 2017-12-03: qty 2

## 2017-12-03 MED ORDER — VANCOMYCIN HCL 500 MG IV SOLR
500.0000 mg | Freq: Three times a day (TID) | INTRAVENOUS | Status: DC
  Filled 2017-12-03: qty 500

## 2017-12-03 MED ORDER — CARBAMAZEPINE 100 MG/5ML PO SUSP
240.0000 mg | Freq: Every day | ORAL | Status: DC
  Administered 2017-12-03 – 2017-12-15 (×13): 240 mg
  Filled 2017-12-03 (×14): qty 15

## 2017-12-03 MED ORDER — ACETAMINOPHEN 325 MG PO TABS
650.0000 mg | ORAL_TABLET | Freq: Four times a day (QID) | ORAL | Status: DC | PRN
  Administered 2017-12-13 (×2): 650 mg via ORAL
  Filled 2017-12-03 (×2): qty 2

## 2017-12-03 MED ORDER — SODIUM CHLORIDE 0.9% FLUSH
10.0000 mL | INTRAVENOUS | Status: DC | PRN
  Administered 2017-12-05: 10 mL
  Filled 2017-12-03: qty 40

## 2017-12-03 MED ORDER — ACETAMINOPHEN 650 MG RE SUPP
975.0000 mg | Freq: Once | RECTAL | Status: AC
  Administered 2017-12-03: 975 mg via RECTAL
  Filled 2017-12-03: qty 1

## 2017-12-03 MED ORDER — VANCOMYCIN HCL 500 MG IV SOLR
500.0000 mg | Freq: Three times a day (TID) | INTRAVENOUS | Status: DC
  Administered 2017-12-03 – 2017-12-05 (×5): 500 mg via INTRAVENOUS
  Filled 2017-12-03 (×6): qty 500

## 2017-12-03 MED ORDER — JEVITY 1.2 CAL PO LIQD
1000.0000 mL | ORAL | Status: DC
  Administered 2017-12-03: 1000 mL
  Filled 2017-12-03 (×2): qty 1000

## 2017-12-03 MED ORDER — SODIUM CHLORIDE 0.9 % IV SOLN
1.0000 g | Freq: Three times a day (TID) | INTRAVENOUS | Status: DC
  Administered 2017-12-03 – 2017-12-05 (×6): 1 g via INTRAVENOUS
  Filled 2017-12-03 (×8): qty 1

## 2017-12-03 MED ORDER — BACLOFEN 5 MG HALF TABLET
15.0000 mg | ORAL_TABLET | Freq: Every day | ORAL | Status: DC
  Administered 2017-12-03 – 2017-12-16 (×13): 15 mg
  Filled 2017-12-03 (×2): qty 1
  Filled 2017-12-03: qty 2
  Filled 2017-12-03 (×6): qty 1
  Filled 2017-12-03: qty 2
  Filled 2017-12-03 (×3): qty 1

## 2017-12-03 MED ORDER — LEVETIRACETAM 100 MG/ML PO SOLN
750.0000 mg | Freq: Every day | ORAL | Status: DC
  Administered 2017-12-03 – 2017-12-05 (×3): 750 mg
  Filled 2017-12-03 (×3): qty 7.5

## 2017-12-03 MED ORDER — ONDANSETRON HCL 4 MG PO TABS: 4.0000 mg | ORAL_TABLET | Freq: Four times a day (QID) | ORAL | Status: DC | PRN

## 2017-12-03 MED ORDER — LEVETIRACETAM 100 MG/ML PO SOLN
500.0000 mg | Freq: Every day | ORAL | Status: DC
  Administered 2017-12-03 – 2017-12-05 (×3): 500 mg
  Filled 2017-12-03 (×4): qty 5

## 2017-12-03 MED ORDER — CARBAMAZEPINE 100 MG/5ML PO SUSP: 220.0000 mg | ORAL | Status: DC

## 2017-12-03 MED ORDER — FENTANYL CITRATE (PF) 100 MCG/2ML IJ SOLN
50.0000 ug | Freq: Once | INTRAMUSCULAR | Status: AC
  Administered 2017-12-03: 50 ug via INTRAVENOUS
  Filled 2017-12-03: qty 2

## 2017-12-03 NOTE — Progress Notes (Signed)
Pharmacy Antibiotic Note  Angela NevinConella S Boening is a 27 y.o. female admitted on 12/02/2017 with sepsis.  Pharmacy has been consulted for Vancomycin and Cefepime dosing.  Patient received Vanc, Cefepime and Flagyl at 0400 this am.  Plan: Continue Vancomycin 500 mg IV q8hr Continue Cefepime 1 gram IV q8hr Monitor CBC, renal function and cultures & sensitivities Will monitor Vancomycin troughs when at steady state  Weight: 105 lb (47.6 kg)  Temp (24hrs), Avg:100.4 F (38 C), Min:97.9 F (36.6 C), Max:102.6 F (39.2 C)  Recent Labs  Lab 12/11/2017 0315 11/17/2017 0321 12/02/2017 0542  WBC 4.0  --   --   CREATININE 0.69  --   --   LATICACIDVEN  --  5.58* 4.91*    CrCl cannot be calculated (Unknown ideal weight.).    Allergies  Allergen Reactions  . Erythromycin Diarrhea  . Azithromycin Diarrhea    Antimicrobials this admission: Vanc 9/18 >> Cefepime 9/18 >>  Flagyl 9/18 x 1   Thank you for allowing pharmacy to be a part of this patient's care.  Jeanella Caraathy Lamontae Ricardo, PharmD, [facility] Clinical Pharmacist Please see AMION for all Pharmacists' Contact Phone Numbers 12/09/2017, 9:12 AM

## 2017-12-03 NOTE — ED Triage Notes (Signed)
Pt had G-tube changed today, per mother broke out in night sweats tonight, pt in nonverbal, pt feels warm to touch, HR 142, BP 99/66, pt crying in triage.

## 2017-12-03 NOTE — Progress Notes (Signed)
Palliative Medicine consult noted. Due to high referral volume, there may be a delay seeing this patient. Please call the Palliative Medicine Team office at [phone] if recommendations are needed in the interim.  Thank you for inviting us to see this patient.  Margret ChanceMelanie G. Delmon Andrada, RN, BSN, [facility] Palliative Medicine Team 12/02/2017 9:24 AM Office [phone]

## 2017-12-03 NOTE — ED Notes (Signed)
Patient transported to CT 

## 2017-12-03 NOTE — Consult Note (Signed)
Reason for Consult: Possible gastric perforation Referring Physician: Dr. Santiago Bumpers is an 27 y.o. female.   HPI: Pt is a 27 y/o with microcephalus, congenital quadriplegia, non-convulsive epilepsy, Cortical Blindness/Severe Mental Retardation, non verbal.  She had a routine exchange of gastro-jejunostomy tube with a 77F balloon retention jejunostomy tube placed yesterday. She presented to the ED this AM with night sweat, patient crying, HR 142, BP 99/66.  On presentation she had a fever of 102.6, HR 144, SBP 90-100.  Admit labs shows WBC 4.0, H/H=17.3/53.3, Platelets 302K.  CMP K+ 3.2, ALT/AST 43/49.  Lactate 5.58 >> 4.91. CXR: Shallow lung inflation with patchy right greater than left bibasilar opacities, which could reflect atelectasis or infiltrates.  CT ABD without contrst scan:Dilated and fluid-filled esophagus suggesting reflux or Dysmotility.  Gastric jejunostomy tube appears to be in place. Nonspecific gas in the epigastric region could be within decompressed stomach or it may indicate localized perforation. Suggest repeat study with water-soluble bowel contrast material to exclude localized perforation.  Plain film with contrast injected into the tube: Gastrostomy tube appears to be in satisfactory position within the jejunum. However, contrast material does not extend to the area of interest in the stomach and gas collections are present suggesting possible contained perforation. Consider an upper GI or tube injection under fluoroscopy to better evaluate for perforation.  We are ask to see  Past Medical History:  Diagnosis Date  . CP (cerebral palsy) ([place])   . Seizures ([place])   . Vision abnormalities     Past Surgical History:  Procedure Laterality Date  . IR GENERIC HISTORICAL  05/07/2016   IR CM INJ ANY COLONIC TUBE W/FLUORO 05/07/2016 Marybelle Killings, MD [facility]  . IR GENERIC HISTORICAL  06/12/2016   IR [place] DUODEN/JEJUNO TUBE PERCUT W/FLUORO 06/12/2016 Sandi Mariscal, MD [facility]  . IR [place] DUODEN/JEJUNO TUBE PERCUT W/FLUORO  03/12/2017  . IR [facility] DUODEN/JEJUNO TUBE PERCUT W/FLUORO  12/02/2017  . REMOVAL OF GASTROSTOMY TUBE      No family history on file.  Social History:  reports that she has never smoked. She has never used smokeless tobacco. She reports that she does not drink alcohol or use drugs.  Allergies:  Allergies  Allergen Reactions  . Erythromycin Diarrhea  . Azithromycin Diarrhea    Prior to Admission medications   Medication Sig Start Date End Date Taking? Authorizing Provider  acetaminophen (TYLENOL) 160 MG/5ML suspension Take 7.5 mLs by mouth every 4 (four) hours as needed for fever. 06/28/16  Yes [provider]  albuterol (PROVENTIL) (2.5 MG/3ML) 0.083% nebulizer solution Take 2.5 mg by nebulization every 4 (four) hours as needed (for wheezing, shortness of breath, or chest congestion).  04/13/17  Yes [provider]  AMBULATORY NON FORMULARY MEDICATION Medication Name: Baclofen Suspension 94m/ml  Take 2 mL by mouth every morning, 1.5 mL at midday, at 1 mL at bedtime Patient taking differently: Medication Name: Baclofen Suspension 171mml: 2 ml's per tube in the morning then 1.5 ml's midday then 1 ml at bedtime 05/21/17  Yes Goodpasture, TiOtila KluverNP  clotrimazole-betamethasone (LOTRISONE) cream Apply 1 application topically 2 (two) times daily as needed (for scalp irritation).  01/02/14  Yes [provider]  FLUOCINOLONE ACETONIDE SCALP 0.01 % OIL Apply 1 application topically every 30 (thirty) days.  01/29/14  Yes [provider]  Fluocinolone Acetonide Scalp 0.01 % OIL Apply 1 application topically See admin instructions. Apply to scalp once a week 12/06/15  Yes [provider]  HYDROcodone-acetaminophen (HYCET) 7.5-325 mg/15 ml solution Take 15 mLs by mouth every 6 (six) hours as needed for moderate pain. Patient taking differently: Place 7.5 mLs into feeding tube every 6 (six)  hours as needed for moderate pain.  08/24/15  Yes Lacretia Leigh, MD  ibuprofen (ADVIL,MOTRIN) 100 MG/5ML suspension Place 10 mLs into feeding tube every 6 (six) hours as needed for pain. 06/28/16  Yes [provider]  levETIRAcetam (KEPPRA) 100 MG/ML solution Give 5 ml by mouth in the morning and 7.36m by mouth at night Patient taking differently: Place 500-750 mg into feeding tube See admin instructions. 500 mg per tube in the morning then 750 mg at bedtime 05/21/17  Yes GRockwell Germany NP  MedroxyPROGESTERone Acetate 150 MG/ML SUSY Inject 150 mg into the muscle every 3 (three) months. 04/02/16  Yes [provider]  omeprazole (PRILOSEC) 20 MG capsule 20 mg See admin instructions. 20 mg per tube three times a day    Yes [provider]  PHENObarbital 20 MG/5ML elixir TAKE 2 AND 1/2 TEASPOONFULS TWICE A DAY 06/19/17  Yes Goodpasture, TOtila Kluver NP  TEGRETOL 100 MG/5ML suspension TAKE 11ML 3 TIMES A DAY Patient taking differently: Place 220-240 mg into feeding tube See admin instructions. 11 ml's (220 mg) two times a day per tube and 12 ml's (240 mg) at 7:30 PM in the evening 08/05/17  Yes Goodpasture, TOtila Kluver NP  AMBULATORY NON FORMULARY MEDICATION Medication Name: Glycopyrrolate 1742m- compound to 42m3mml. Give 0.3ml542mery 8 hours (for drooling) 05/21/17   GoodRockwell Germany  doxycycline (VIBRAMYCIN) 100 MG capsule Take 1 capsule (100 mg total) by mouth 2 (two) times daily. Patient not taking: Reported on 11/16/2017 04/24/17   PlunBlanchie Dessert      Results for orders placed or performed during the hospital encounter of 11/30/2017 (from the past 48 hour(s))  Comprehensive metabolic panel     Status: Abnormal   Collection Time: 11/29/2017  3:15 AM  Result Value Ref Range   Sodium 137 135 - 145 mmol/L   Potassium 3.2 (L) 3.5 - 5.1 mmol/L   Chloride 104 98 - 111 mmol/L   CO2 17 (L) 22 - 32 mmol/L   Glucose, Bld 132 (H) 70 - 99 mg/dL   BUN 8 6 - 20 mg/dL   Creatinine, Ser 0.69 0.44  - 1.00 mg/dL   Calcium 8.4 (L) 8.9 - 10.3 mg/dL   Total Protein 6.1 (L) 6.5 - 8.1 g/dL   Albumin 2.9 (L) 3.5 - 5.0 g/dL   AST 43 (H) 15 - 41 U/L   ALT 49 (H) 0 - 44 U/L   Alkaline Phosphatase 65 38 - 126 U/L   Total Bilirubin 0.7 0.3 - 1.2 mg/dL   GFR calc non Af Amer >60 >60 mL/min   GFR calc Af Amer >60 >60 mL/min    Comment: (NOTE) The eGFR has been calculated using the CKD EPI equation. This calculation has not been validated in all clinical situations. eGFR's persistently <60 mL/min signify possible Chronic Kidney Disease.    Anion gap 16 (H) 5 - 15    Comment: Performed at [facility] [address] [id number] with Differential     Status: Abnormal   Collection Time: 11/24/2017  3:15 AM  Result Value Ref Range   WBC 4.0 4.0 - 10.5 K/uL   RBC 5.84 (H) 3.87 - 5.11 MIL/uL   Hemoglobin 17.3 (H) 12.0 - 15.0 g/dL   HCT  53.3 (H) 36.0 - 46.0 %   MCV 91.3 78.0 - 100.0 fL   MCH 29.6 26.0 - 34.0 pg   MCHC 32.5 30.0 - 36.0 g/dL   RDW 13.3 11.5 - 15.5 %   Platelets 302 150 - 400 K/uL   Neutrophils Relative % 78 %   Neutro Abs 3.2 1.7 - 7.7 K/uL   Lymphocytes Relative 10 %   Lymphs Abs 0.4 (L) 0.7 - 4.0 K/uL   Monocytes Relative 8 %   Monocytes Absolute 0.3 0.1 - 1.0 K/uL   Eosinophils Relative 3 %   Eosinophils Absolute 0.1 0.0 - 0.7 K/uL   Basophils Relative 1 %   Basophils Absolute 0.0 0.0 - 0.1 K/uL   Immature Granulocytes 0 %   Abs Immature Granulocytes 0.0 0.0 - 0.1 K/uL    Comment: Performed at [facility] [address], [place], [place] [address]  I-Stat CG4 Lactic Acid, ED     Status: Abnormal   Collection Time: 11/28/2017  3:21 AM  Result Value Ref Range   Lactic Acid, Venous 5.58 (HH) 0.5 - 1.9 mmol/L   Comment NOTIFIED PHYSICIAN   I-Stat beta hCG blood, ED     Status: None   Collection Time: 12/02/2017  3:22 AM  Result Value Ref Range   I-stat hCG, quantitative <5.0 <5 mIU/mL   Comment 3            Comment:   GEST. AGE       CONC.  (mIU/mL)   <=1 WEEK        5 - 50     2 WEEKS       50 - 500     3 WEEKS       100 - 10,000     4 WEEKS     1,000 - 30,000        FEMALE AND NON-PREGNANT FEMALE:     LESS THAN 5 mIU/mL   Urinalysis, Routine w reflex microscopic     Status: Abnormal   Collection Time: 11/30/2017  4:13 AM  Result Value Ref Range   Color, Urine AMBER (A) YELLOW    Comment: BIOCHEMICALS MAY BE AFFECTED BY COLOR   APPearance HAZY (A) CLEAR   Specific Gravity, Urine 1.040 (H) 1.005 - 1.030   pH 5.0 5.0 - 8.0   Glucose, UA NEGATIVE NEGATIVE mg/dL   Hgb urine dipstick MODERATE (A) NEGATIVE   Bilirubin Urine SMALL (A) NEGATIVE   Ketones, ur NEGATIVE NEGATIVE mg/dL   Protein, ur 100 (A) NEGATIVE mg/dL   Nitrite NEGATIVE NEGATIVE   Leukocytes, UA NEGATIVE NEGATIVE   RBC / HPF 21-50 0 - 5 RBC/hpf   WBC, UA 0-5 0 - 5 WBC/hpf   Bacteria, UA RARE (A) NONE SEEN   Squamous Epithelial / LPF 6-10 0 - 5   Mucus PRESENT    Hyaline Casts, UA PRESENT    Amorphous Crystal PRESENT     Comment: Performed at [facility], [address] [place], [place] [address]  I-Stat CG4 Lactic Acid, ED     Status: Abnormal   Collection Time: 11/19/2017  5:42 AM  Result Value Ref Range   Lactic Acid, Venous 4.91 (HH) 0.5 - 1.9 mmol/L   Comment NOTIFIED PHYSICIAN     Ct Abdomen Pelvis Wo Contrast  Result Date: 11/25/2017 CLINICAL DATA:  Gastrostomy tube change today. Generalized abdominal pain, fever, and night sweats. EXAM: CT ABDOMEN AND PELVIS WITHOUT CONTRAST TECHNIQUE: Multidetector CT  imaging of the abdomen and pelvis was performed following the standard protocol without IV contrast. COMPARISON:  None. FINDINGS: Lower chest: Consolidation or atelectasis in the right lung base. Esophagus is dilated and fluid-filled, suggesting reflux or dysmotility. Hepatobiliary: No focal liver abnormality is seen. No gallstones, gallbladder wall thickening, or biliary dilatation. Pancreas: Unremarkable. No pancreatic ductal dilatation  or surrounding inflammatory changes. Spleen: Normal in size without focal abnormality. Adrenals/Urinary Tract: Adrenal glands are unremarkable. Kidneys are normal, without renal calculi, focal lesion, or hydronephrosis. Bladder is unremarkable. Stomach/Bowel: Stomach, small bowel, and colon are not abnormally distended. A gastrojejunostomy tube is present. The distal tip is projecting outside of the bowel wall, likely tenting the bowel wall. There is gas collection in the upper epigastric region which may be within the decompressed stomach but it could also be extraluminal. Visualization of this area is somewhat difficult due to under distention of the stomach and motion artifact. Consider repeat imaging with water-soluble contrast material to exclude gastric perforation period no free intra-abdominal air is identified. Stomach, small bowel, and colon are not abnormally distended. Mild wall thickening suggested the sigmoid colon may indicate focal colitis. Residual contrast material is demonstrated throughout the colon. The appendix is normal. Vascular/Lymphatic: No significant vascular findings are present. No enlarged abdominal or pelvic lymph nodes. Reproductive: Uterus and bilateral adnexa are unremarkable. Other: Small amount of fluid in the pelvis is likely reactive or inflammatory. Infiltration in the mesentery is also probably inflammatory. No discrete abscess. Ventricular peritoneal shunt tubing is present with tip in the pelvis. Musculoskeletal: Postoperative changes in both hips and the left acetabulum. Lumbar scoliosis convex towards the left. IMPRESSION: 1. Consolidation or atelectasis in the right lung base. Possible pneumonia or aspiration. 2. Dilated and fluid-filled esophagus suggesting reflux or dysmotility. 3. Gastric jejunostomy tube appears to be in place. Nonspecific gas in the epigastric region could be within decompressed stomach or it may indicate localized perforation. Suggest repeat study  with water-soluble bowel contrast material to exclude localized perforation. 4. Mild wall thickening of the sigmoid colon may indicate focal colitis. Small amount of free fluid in the pelvis is likely reactive or inflammatory. 5. Ventricular shunt tubing is present with tip in the pelvis. 6. Postoperative changes in the hips and left acetabulum. These results were called by telephone at the time of interpretation on 12/12/2017 at 5:42 am to Dr. Ripley Fraise , who verbally acknowledged these results. Electronically Signed   By: Lucienne Capers M.D.   On: 12/11/2017 05:50   Dg Abdomen 1 View  Result Date: 11/29/2017 CLINICAL DATA:  Tube placement EXAM: ABDOMEN - 1 VIEW COMPARISON:  CT abdomen and pelvis 11/22/2017 FINDINGS: A small amount of contrast material was injected into the gastrojejunostomy tube. Contrast material fills the tube and a small amount of small bowel. The tube does appear to be in position. However, this does not resolve the issue of possible perforation at the level of the stomach. There are needs to be more contrast material instilled into the tube to try and reflux contrast back into the stomach and the imaging needs to include the stomach in lower chest. Consider obtaining an upper GI or tube injection under fluoroscopy to better evaluate for this. The mid abdominal gas collection seen at CT are also seen at plain radiograph and the appearance is suspicious for localized contained perforation. Residual contrast material is also noted in the colon. Ventricular peritoneal shunt tubing. Postoperative changes in the hips. IMPRESSION: Gastrostomy tube appears to be in satisfactory  position within the jejunum. However, contrast material does not extend to the area of interest in the stomach and gas collections are present suggesting possible contained perforation. Consider an upper GI or tube injection under fluoroscopy to better evaluate for perforation. Electronically Signed   By: Lucienne Capers M.D.   On: 11/23/2017 06:58   Ir Replc Duoden/jejuno Tube Percut W/fluoro  Result Date: 12/02/2017 INDICATION: 27 year old female with dysphagia. She has an indwelling percutaneous gastrojejunostomy tube, with a functional 24 French balloon retention jejunostomy. The catheter is damaged and she presents for replacement EXAM: IMAGE GUIDED EXCHANGE OF ENTERIC PERCUTANEOUS FEEDING TUBE MEDICATIONS: None ANESTHESIA/SEDATION: None CONTRAST:  Fifteen-administered into the gastric lumen. FLUOROSCOPY TIME:  Fluoroscopy Time: 0 minutes 54 seconds (2.0 mGy). COMPLICATIONS: None PROCEDURE: Informed written consent was obtained from the patient after a thorough discussion of the procedural risks, benefits and alternatives. All questions were addressed. Maximal Sterile Barrier Technique was utilized including caps, mask, sterile gowns, sterile gloves, sterile drape, hand hygiene and skin antiseptic. A timeout was performed prior to the initiation of the procedure. The patient is positioned supine position on the fluoroscopy table. The upper abdomen was prepped and draped in the usual sterile fashion including the indwelling tube. Contrast was infused confirming location within the proximal jejunum. Glidewire was advanced through the catheter and then the catheter was cut in order to decompress the balloon retention. Stiff glidewire was passed without difficulty and the catheter was removed from the stiff Glidewire. A new 24 French balloon retention jejunostomy was placed over the Glidewire without difficulty. Wire was removed, balloon was inflated and contrast confirmed location. Patient tolerated the procedure well and remained hemodynamically stable throughout. No complications were encountered and no significant blood loss. IMPRESSION: Status post routine exchange of percutaneous enteric feeding tube, a 24 French balloon retention jejunostomy with gastro J approach. Signed, Dulcy Fanny. Dellia Nims, RPVI [facility] Electronically Signed   By: Corrie Mckusick D.O.   On: 12/02/2017 10:18   Dg Chest Port 1 View  Result Date: 11/20/2017 CLINICAL DATA:  Initial evaluation for acute fever. EXAM: PORTABLE CHEST 1 VIEW COMPARISON:  Prior radiograph from 04/24/2017. FINDINGS: Patient markedly rotated to the right. Allowing for rotation level cardiac and mediastinal silhouettes are grossly stable in size and contour, and remain within normal limits. Lungs are hypoinflated. Patchy bibasilar opacities, right greater than left, could reflect atelectasis or infiltrates. No pulmonary edema or pleural effusion. No pneumothorax. No acute osseous finding. Percutaneous G-tube partially visualized overlying the upper abdomen. Abnormality. IMPRESSION: Shallow lung inflation with patchy right greater than left bibasilar opacities, which could reflect atelectasis or infiltrates. Electronically Signed   By: Jeannine Boga M.D.   On: 11/29/2017 03:46    Review of Systems  Unable to perform ROS: Medical condition  Constitutional:       Patient is nonverbal patient all information is from her mother.  Her mother states that she did well with first tube feeding.  The second tube feeding she seemed to have some discomfort.   Blood pressure 122/89, pulse (!) 136, temperature (!) 100.6 F (38.1 C), temperature source Rectal, resp. rate (!) 26, weight 47.6 kg, SpO2 99 %. Physical Exam  Constitutional:  Microcephaly, quadriplegic, non verbal, with contractures in bed.  Sitting up some.  Fairly comfortable.  She cannot speak.  Initially on exam she was not uncomfortable.  HENT:  Head: Atraumatic.  Mouth/Throat: Oropharynx is clear and moist.  Eyes: Right eye exhibits  no discharge. Left eye exhibits no discharge. No scleral icterus.  Pupils are equal  Neck: Normal range of motion. Neck supple. No JVD present. No tracheal deviation present. No thyromegaly present.   Cardiovascular: Normal rate, regular rhythm and normal heart sounds.  No murmur heard. Respiratory: Effort normal and breath sounds normal. No respiratory distress. She has no wheezes. She has no rales. She exhibits no tenderness.  GI: She exhibits no mass. There is tenderness (Initially on exam she was not tender.  I laid her down to get her exam.  And she became uncomfortable and started crying point.). There is no rebound and no guarding.  On initial exam her abdomen was soft.  The gastrostomy tube had some drainage around the site.  Somewhat sour smell.  The gastrostomy site itself looked good and there is no cellulitis or inflammation.  Musculoskeletal: She exhibits no edema or tenderness.  She has contractures both lower legs and upper extremities  Lymphadenopathy:    She has no cervical adenopathy.  Neurological:  Patient is blind and nonverbal.  Skin: Skin is warm and dry. No rash noted. No erythema. No pallor.  Psychiatric:  Could not be assessed.    Assessment/Plan: Gastrojejunostomy tube change with questionable perforation Possible pneumonia  Hematuria months of her father having the right Dr. Mitchell Heir, congenital quadriplegia, cortical blindness Severe mental retardation/nonverbal [place] 5 minutes later Plan: I contacted IR and they reviewed the films and discussed it with Dr. Earleen Newport.  They took her to IR and they adjusted the tube and injected contrast.  This showed no evidence of catheter malposition, malfunction or extraluminal contrast extravasation.  It was their opinion this was safe to use.  Patient is being admitted to Medicine and we will be available as needed.  Brean Carberry 12/02/2017, 7:25 AM

## 2017-12-03 NOTE — Progress Notes (Signed)
Paged by RN for JG tube being incorrectly hooked up without stop cock in place to administer PO medications. We were able to unhook the J-port from tube feed and to replace the stop cock in between. Nursing was also concerned about patient's heart rate in the 140's. Blood pressure is stable but soft around 98/70. She has been tachycardic since admission but is no longer febrile. Her baseline HR is not completely clear. We will reorder LA and monitor for now.   Amber Curry, Tira Lafferty A, DO 12/08/2017, 10:36 PM Pager: [phone]

## 2017-12-03 NOTE — ED Provider Notes (Signed)
[facility] EMERGENCY DEPARTMENT Provider Note   CSN: [id number] Arrival date & time: 12/15/2017  0247     History   Chief Complaint Chief Complaint  Patient presents with  . Fever  Level 5 caveat due to acuity of condition  HPI Amber Curry is a 27 y.o. female.  The history is provided by a caregiver and a parent. The history is limited by the condition of the patient.  Fever   This is a new problem. The problem occurs constantly. The problem has not changed since onset.Pertinent negatives include no vomiting and no cough.  Pt with history of cerebral palsy, seizures, GJ tube in place presents with fever.  Mother reports that the patient had had routine change of the GJ tube yesterday.  Soon after she was noted to be sweating a lot and not acting her usual self She has a history of seizures, but no recent change in seizure activity.  No new medicines.  She had otherwise been her baseline.  Past Medical History:  Diagnosis Date  . CP (cerebral palsy) (HCC)   . Seizures (HCC)   . Vision abnormalities     Patient Active Problem List   Diagnosis Date Noted  . Dysphagia, pharyngoesophageal phase 05/22/2017  . 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 Drooling 02/07/2014  . 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 Microcephalus (HCC) 01/31/2013  . 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 Long-term use of high-risk medication 01/31/2013    Past Surgical History:  Procedure Laterality Date  . IR GENERIC HISTORICAL  05/07/2016   IR CM INJ ANY COLONIC TUBE W/FLUORO 05/07/2016 Jolaine Click, MD [facility]  . IR GENERIC HISTORICAL  06/12/2016   IR REPLC DUODEN/JEJUNO TUBE PERCUT W/FLUORO 06/12/2016 Simonne Come, MD [facility]  . IR REPLC DUODEN/JEJUNO TUBE PERCUT W/FLUORO  03/12/2017  . IR  REPLC DUODEN/JEJUNO TUBE PERCUT W/FLUORO  12/02/2017  . REMOVAL OF GASTROSTOMY TUBE       OB History   None      Home Medications    Prior to Admission medications   Medication Sig Start Date End Date Taking? Authorizing Provider  AMBULATORY NON FORMULARY MEDICATION Medication Name: Glycopyrrolate 1mg  - compound to 1mg /40ml. Give 0.13ml every 8 hours (for drooling) 05/21/17   Elveria Rising, NP  AMBULATORY NON FORMULARY MEDICATION Medication Name: Baclofen Suspension 10mg /ml  Take 2 mL by mouth every morning, 1.5 mL at midday, at 1 mL at bedtime 05/21/17   Elveria Rising, NP  clotrimazole-betamethasone (LOTRISONE) cream  01/02/14   [provider]  doxycycline (VIBRAMYCIN) 100 MG capsule Take 1 capsule (100 mg total) by mouth 2 (two) times daily. 04/24/17   Gwyneth Sprout, MD  ferrous sulfate 220 (44 FE) MG/5ML solution  12/14/13   [provider]  FLUOCINOLONE ACETONIDE SCALP 0.01 % OIL Apply topically at bedtime. 01/29/14   [provider]  Fluocinolone Acetonide Scalp 0.01 % OIL Apply once a week 12/06/15   [provider]  fluocinonide ointment (LIDEX) 0.05 %  01/06/14   [provider]  HYDROcodone-acetaminophen (HYCET) 7.5-325 mg/15 ml solution Take 15 mLs by mouth every 6 (six) hours as needed for moderate pain. 08/24/15   Lorre Nick, MD  lansoprazole (PREVACID SOLUTAB) 15 MG disintegrating tablet Give 1 tablet twice per day    [provider]  levETIRAcetam (KEPPRA) 100 MG/ML solution Give  5 ml by mouth in the morning and 7.66ml by mouth at night 05/21/17   Elveria Rising, NP  medroxyPROGESTERone (DEPO-SUBQ PROVERA 104) 104 MG/0.65ML injection Inject 104 mg into the skin every 3 (three) months.    [provider]  MedroxyPROGESTERone Acetate 150 MG/ML SUSY Inject 150 mg into the muscle every 3 (three) months. 04/02/16   [provider]  PHENObarbital 20 MG/5ML elixir TAKE 2 AND 1/2 TEASPOONFULS TWICE A DAY  06/19/17   Elveria Rising, NP  TEGRETOL 100 MG/5ML suspension TAKE 3 TIMES A DAY 08/05/17   Elveria Rising, NP    Family History No family history on file.  Social History Social History   Tobacco Use  . Smoking status: Never Smoker  . Smokeless tobacco: Never Used  Substance Use Topics  . Alcohol use: No  . Drug use: No     Allergies   Erythromycin and Azithromycin   Review of Systems Review of Systems  Unable to perform ROS: Acuity of condition  Constitutional: Positive for fever.  Respiratory: Negative for cough.   Gastrointestinal: Negative for vomiting.     Physical Exam Updated Vital Signs BP 96/80   Pulse (!) 143   Temp (!) 102.6 F (39.2 C) (Rectal)   Resp (!) 36   Wt 47.6 kg   SpO2 99%   Physical Exam CONSTITUTIONAL: Somnolent, chronically ill-appearing HEAD: Normocephalic/atraumatic EYES: EOM ENMT: Mucous membranes dry, protruding tongue NECK: supple no meningeal signs CV: Tachycardic LUNGS: Tachypnea and coarse breath sounds bilaterally ABDOMEN: soft, soft, diffuse tenderness, tube in place NEURO: Pt is somnolent but arousable, she is nonverbal EXTREMITIES: Chronic contractures of lower extremities SKIN: warm, color normal PSYCH: Unable to assess ED Treatments / Results  Labs (all labs ordered are listed, but only abnormal results are displayed) Labs Reviewed  COMPREHENSIVE METABOLIC PANEL - Abnormal; Notable for the following components:      Result Value   Potassium 3.2 (*)    CO2 17 (*)    Glucose, Bld 132 (*)    Calcium 8.4 (*)    Total Protein 6.1 (*)    Albumin 2.9 (*)    AST 43 (*)    ALT 49 (*)    Anion gap 16 (*)    All other components within normal limits  CBC WITH DIFFERENTIAL/PLATELET - Abnormal; Notable for the following components:   RBC 5.84 (*)    Hemoglobin 17.3 (*)    HCT 53.3 (*)    Lymphs Abs 0.4 (*)    All other components within normal limits  URINALYSIS, ROUTINE W REFLEX MICROSCOPIC - Abnormal;  Notable for the following components:   Color, Urine AMBER (*)    APPearance HAZY (*)    Specific Gravity, Urine 1.040 (*)    Hgb urine dipstick MODERATE (*)    Bilirubin Urine SMALL (*)    Protein, ur 100 (*)    Bacteria, UA RARE (*)    All other components within normal limits  I-STAT CG4 LACTIC ACID, ED - Abnormal; Notable for the following components:   Lactic Acid, Venous 5.58 (*)    All other components within normal limits  I-STAT CG4 LACTIC ACID, ED - Abnormal; Notable for the following components:   Lactic Acid, Venous 4.91 (*)    All other components within normal limits  CULTURE, BLOOD (ROUTINE X 2)  CULTURE, BLOOD (ROUTINE X 2)  URINE CULTURE  I-STAT BETA HCG BLOOD, ED ([facility], [facility], [facility] ONLY)    EKG EKG Interpretation  Date/Time:  Wednesday 12-05-2017 02:58:21 EDT Ventricular Rate:  141 PR Interval:  114 QRS Duration: 50 QT Interval:  274 QTC Calculation: 419 R Axis:   67 Text Interpretation:  Sinus tachycardia Low voltage QRS Nonspecific T wave abnormality Abnormal ECG Confirmed by Zadie Rhine ([id number]) on Dec 05, 2017 4:09:27 AM   EKG Interpretation  Date/Time:  Wednesday 12/05/2017 07:38:56 EDT Ventricular Rate:  143 PR Interval:  114 QRS Duration: 46 QT Interval:  293 QTC Calculation: 452 R Axis:   44 Text Interpretation:  Sinus tachycardia Multiform ventricular premature complexes Aberrant complex Low voltage, extremity and precordial leads Confirmed by Zadie Rhine ([id number]) on 2017-12-05 7:50:18 AM       Radiology Ct Abdomen Pelvis Wo Contrast  Result Date: Dec 05, 2017 CLINICAL DATA:  Gastrostomy tube change today. Generalized abdominal pain, fever, and night sweats. EXAM: CT ABDOMEN AND PELVIS WITHOUT CONTRAST TECHNIQUE: Multidetector CT imaging of the abdomen and pelvis was performed following the standard protocol without IV contrast. COMPARISON:  None. FINDINGS: Lower chest: Consolidation or atelectasis in the right lung base. Esophagus  is dilated and fluid-filled, suggesting reflux or dysmotility. Hepatobiliary: No focal liver abnormality is seen. No gallstones, gallbladder wall thickening, or biliary dilatation. Pancreas: Unremarkable. No pancreatic ductal dilatation or surrounding inflammatory changes. Spleen: Normal in size without focal abnormality. Adrenals/Urinary Tract: Adrenal glands are unremarkable. Kidneys are normal, without renal calculi, focal lesion, or hydronephrosis. Bladder is unremarkable. Stomach/Bowel: Stomach, small bowel, and colon are not abnormally distended. A gastrojejunostomy tube is present. The distal tip is projecting outside of the bowel wall, likely tenting the bowel wall. There is gas collection in the upper epigastric region which may be within the decompressed stomach but it could also be extraluminal. Visualization of this area is somewhat difficult due to under distention of the stomach and motion artifact. Consider repeat imaging with water-soluble contrast material to exclude gastric perforation period no free intra-abdominal air is identified. Stomach, small bowel, and colon are not abnormally distended. Mild wall thickening suggested the sigmoid colon may indicate focal colitis. Residual contrast material is demonstrated throughout the colon. The appendix is normal. Vascular/Lymphatic: No significant vascular findings are present. No enlarged abdominal or pelvic lymph nodes. Reproductive: Uterus and bilateral adnexa are unremarkable. Other: Small amount of fluid in the pelvis is likely reactive or inflammatory. Infiltration in the mesentery is also probably inflammatory. No discrete abscess. Ventricular peritoneal shunt tubing is present with tip in the pelvis. Musculoskeletal: Postoperative changes in both hips and the left acetabulum. Lumbar scoliosis convex towards the left. IMPRESSION: 1. Consolidation or atelectasis in the right lung base. Possible pneumonia or aspiration. 2. Dilated and fluid-filled  esophagus suggesting reflux or dysmotility. 3. Gastric jejunostomy tube appears to be in place. Nonspecific gas in the epigastric region could be within decompressed stomach or it may indicate localized perforation. Suggest repeat study with water-soluble bowel contrast material to exclude localized perforation. 4. Mild wall thickening of the sigmoid colon may indicate focal colitis. Small amount of free fluid in the pelvis is likely reactive or inflammatory. 5. Ventricular shunt tubing is present with tip in the pelvis. 6. Postoperative changes in the hips and left acetabulum. These results were called by telephone at the time of interpretation on 12-05-17 at 5:42 am to Dr. Zadie Rhine , who verbally acknowledged these results. Electronically Signed   By: Burman Nieves M.D.   On: 12/05/2017 05:50   Dg Abdomen 1 View  Result Date: 12-05-2017 CLINICAL DATA:  Tube placement EXAM: ABDOMEN -  1 VIEW COMPARISON:  CT abdomen and pelvis 10/13/17 FINDINGS: A small amount of contrast material was injected into the gastrojejunostomy tube. Contrast material fills the tube and a small amount of small bowel. The tube does appear to be in position. However, this does not resolve the issue of possible perforation at the level of the stomach. There are needs to be more contrast material instilled into the tube to try and reflux contrast back into the stomach and the imaging needs to include the stomach in lower chest. Consider obtaining an upper GI or tube injection under fluoroscopy to better evaluate for this. The mid abdominal gas collection seen at CT are also seen at plain radiograph and the appearance is suspicious for localized contained perforation. Residual contrast material is also noted in the colon. Ventricular peritoneal shunt tubing. Postoperative changes in the hips. IMPRESSION: Gastrostomy tube appears to be in satisfactory position within the jejunum. However, contrast material does not extend to the  area of interest in the stomach and gas collections are present suggesting possible contained perforation. Consider an upper GI or tube injection under fluoroscopy to better evaluate for perforation. Electronically Signed   By: Burman NievesWilliam  Stevens M.D.   On: 10/13/17 06:58   Ir Replc Duoden/jejuno Tube Percut W/fluoro  Result Date: 12/02/2017 INDICATION: 27 year old female with dysphagia. She has an indwelling percutaneous gastrojejunostomy tube, with a functional 24 French balloon retention jejunostomy. The catheter is damaged and she presents for replacement EXAM: IMAGE GUIDED EXCHANGE OF ENTERIC PERCUTANEOUS FEEDING TUBE MEDICATIONS: None ANESTHESIA/SEDATION: None CONTRAST:  Fifteen-administered into the gastric lumen. FLUOROSCOPY TIME:  Fluoroscopy Time: 0 minutes 54 seconds (2.0 mGy). COMPLICATIONS: None PROCEDURE: Informed written consent was obtained from the patient after a thorough discussion of the procedural risks, benefits and alternatives. All questions were addressed. Maximal Sterile Barrier Technique was utilized including caps, mask, sterile gowns, sterile gloves, sterile drape, hand hygiene and skin antiseptic. A timeout was performed prior to the initiation of the procedure. The patient is positioned supine position on the fluoroscopy table. The upper abdomen was prepped and draped in the usual sterile fashion including the indwelling tube. Contrast was infused confirming location within the proximal jejunum. Glidewire was advanced through the catheter and then the catheter was cut in order to decompress the balloon retention. Stiff glidewire was passed without difficulty and the catheter was removed from the stiff Glidewire. A new 24 French balloon retention jejunostomy was placed over the Glidewire without difficulty. Wire was removed, balloon was inflated and contrast confirmed location. Patient tolerated the procedure well and remained hemodynamically stable throughout. No complications were  encountered and no significant blood loss. IMPRESSION: Status post routine exchange of percutaneous enteric feeding tube, a 24 French balloon retention jejunostomy with gastro J approach. Signed, Yvone NeuJaime S. Reyne DumasWagner, DO, RPVI [facility] Electronically Signed   By: Gilmer MorJaime  Wagner D.O.   On: 12/02/2017 10:18   Dg Chest Port 1 View  Result Date: 01-07-18 CLINICAL DATA:  Initial evaluation for acute fever. EXAM: PORTABLE CHEST 1 VIEW COMPARISON:  Prior radiograph from 04/24/2017. FINDINGS: Patient markedly rotated to the right. Allowing for rotation level cardiac and mediastinal silhouettes are grossly stable in size and contour, and remain within normal limits. Lungs are hypoinflated. Patchy bibasilar opacities, right greater than left, could reflect atelectasis or infiltrates. No pulmonary edema or pleural effusion. No pneumothorax. No acute osseous finding. Percutaneous G-tube partially visualized overlying the upper abdomen. Abnormality. IMPRESSION: Shallow lung inflation with patchy right greater  than left bibasilar opacities, which could reflect atelectasis or infiltrates. Electronically Signed   By: Rise Mu M.D.   On: December 08, 2017 03:46    Procedures Procedures   CRITICAL CARE Performed by: Joya Gaskins Total critical care time: 50 minutes Critical care time was exclusive of separately billable procedures and treating other patients. Critical care was necessary to treat or prevent imminent or life-threatening deterioration. Critical care was time spent personally by me on the following activities: development of treatment plan with patient and/or surrogate as well as nursing, discussions with consultants, evaluation of patient's response to treatment, examination of patient, obtaining history from patient or surrogate, ordering and performing treatments and interventions, ordering and review of laboratory studies, ordering and  review of radiographic studies, pulse oximetry and re-evaluation of patient's condition.  Medications Ordered in ED Medications  metroNIDAZOLE (FLAGYL) IVPB 500 mg (0 mg Intravenous Stopped 2017-12-08 0551)  acetaminophen (TYLENOL) suppository 975 mg (975 mg Rectal Given 12/08/2017 0414)  sodium chloride 0.9 % bolus 1,000 mL (0 mLs Intravenous Stopped 12/08/17 0509)    And  sodium chloride 0.9 % bolus 1,000 mL (0 mLs Intravenous Stopped 08-Dec-2017 0631)  ceFEPIme (MAXIPIME) 2 g in sodium chloride 0.9 % 100 mL IVPB (0 g Intravenous Stopped 2017/12/08 0448)  vancomycin (VANCOCIN) IVPB 1000 mg/200 mL premix (0 mg Intravenous Stopped December 08, 2017 0655)  fentaNYL (SUBLIMAZE) injection 50 mcg (50 mcg Intravenous Given 12-08-2017 0601)  iopamidol (ISOVUE-M) 41 % intrathecal injection (20 mLs Gastrostomy Tube Contrast Given 12/08/17 9629)     Initial Impression / Assessment and Plan / ED Course  I have reviewed the triage vital signs and the nursing notes.  Pertinent labs & imaging results that were available during my care of the patient were reviewed by me and considered in my medical decision making (see chart for details).     4:13 AM Patient chronically ill with history of cerebral palsy presents with fever.  Patient is currently septic with a lactate greater than 5.  IV fluids and IV antibiotics were.  Code sepsis been ordered.  She does appear to have grimacing with tenderness to palpation of abdomen Due to recent GJ tube replacement, will proceed with CT imaging of abdomen pelvis 4:48 AM Sepsis - Repeat Assessment  Performed at:    0448am  Vitals     Blood pressure 102/74, pulse (!) 142, temperature (!) 102.6 F (39.2 C), temperature source Rectal, resp. rate (!) 30, weight 47.6 kg, SpO2 99 %.  Heart:     Tachycardic  Lungs:    Rhonchi  Capillary Refill:   <2 sec  Peripheral Pulse:   Radial pulse palpable  Skin:     Normal Color  4:49 AM Is stable with him proved blood pressure, but still  tachycardic.  We will proceed with CT imaging of abdomen to rule out any complications from recent procedure 5:50 AM D/w radiology dr Andria Meuse Unclear if there is possible gastric perforation He requests to proceed with KUB and use water soluble contrast to evaluate tube placement 8:11 AM Patient still persistently tachycardic, but blood pressures remain above 100, maps remained above 65. Patient is still very ill, and tachycardic.  Her lactate is improved but still above 4.  She has received broad-spectrum antibiotics and IV fluids.  I have consulted general surgery Dr. Luisa Hart.  He has reviewed x-rays and CT imaging extensively.  He does not feel this is an acute perforation, but they will follow as consultants and she has been seen by  general surgery PA Marlyne Beards in the emergency department.  I discussed the case with critical care Dr. Molli Knock I have discussed imaging, labs and concern for severe sepsis with lactate greater than 4 as well as persistent tachycardia He requests to admit patient to the medical service.  Discussed with the internal medicine residency who will admit patient to the stepdown unit. Mother/family has been updated extensively on the plan Of note, there may be signs of pneumonia on CT imaging. Final Clinical Impressions(s) / ED Diagnoses   Final diagnoses:  Sepsis, due to unspecified organism [facility])  Dehydration  HCAP (healthcare-associated pneumonia)    ED Discharge Orders    None       Zadie Rhine, MD 06-Dec-2017 [phone]

## 2017-12-03 NOTE — ED Notes (Signed)
Patient desat to 88% on room air.

## 2017-12-03 NOTE — Progress Notes (Signed)
Peripherally Inserted Central Catheter/Midline Placement  The IV Nurse has discussed with the patient and/or persons authorized to consent for the patient, the purpose of this procedure and the potential benefits and risks involved with this procedure.  The benefits include less needle sticks, lab draws from the catheter, and the patient may be discharged home with the catheter. Risks include, but not limited to, infection, bleeding, blood clot (thrombus formation), and puncture of an artery; nerve damage and irregular heartbeat and possibility to perform a PICC exchange if needed/ordered by physician.  Alternatives to this procedure were also discussed.  Bard Power PICC patient education guide, fact sheet on infection prevention and patient information card has been provided to patient /or left at bedside.    PICC/Midline Placement Documentation  PICC Double Lumen 12/12/2017 PICC Right Brachial 32 cm 3 cm (Active)  Indication for Insertion or Continuance of Line Poor Vasculature-patient has had multiple peripheral attempts or PIVs lasting less than 24 hours 11/23/2017 10:19 PM  Exposed Catheter (cm) 3 cm 12/11/2017 10:19 PM  Site Assessment Clean;Dry;Intact 12/11/2017 10:19 PM  Lumen #1 Status Blood return noted;Flushed;Saline locked 11/29/2017 10:19 PM  Lumen #2 Status Blood return noted;Flushed;Saline locked 12/08/2017 10:19 PM  Dressing Type Transparent;Securing device 12/13/2017 10:19 PM  Dressing Status Clean;Dry;Intact;Antimicrobial disc in place 11/27/2017 10:19 PM  Line Adjustment (NICU/IV Team Only) No 12/08/2017 10:19 PM  Dressing Intervention New dressing 11/25/2017 10:19 PM  Dressing Change Due 12/10/17 12/11/2017 10:19 PM       Netta Corriganhomas, Aariana Shankland L 11/18/2017, 10:41 PM

## 2017-12-03 NOTE — ED Notes (Signed)
Patient transported to X-ray 

## 2017-12-03 NOTE — ED Notes (Signed)
Repeated ekg due to heart rate still up to 144 , Md notified

## 2017-12-03 NOTE — ED Notes (Signed)
Pt transported to IR 

## 2017-12-03 NOTE — H&P (Signed)
Date: 2017-12-24               Patient Name:  Amber Curry MRN: [id number]  DOB: 09/23/1990 Age / Sex: 27 y.o., female   PCP: Laqueta Due., MD         Medical Service: Internal Medicine Teaching Service         Attending Physician: Dr. Burns Spain, MD    First Contact: Dr. Dortha Schwalbe Pager: [phone]  Second Contact: Dr. Alinda Money Pager: [phone]       After Hours (After 5p/  First Contact Pager: [phone]  weekends / holidays): Second Contact Pager: [phone]   Chief Complaint: diaphoresis, AMS   History of Present Illness:   This is a 27 year-old female with cerebral palsy complicated by intellectual disability, severe spastic quadriparesis, cortical blindness, dysphagia with GJT, and seizure disorder who presents for evaluation of fevers x1 day. She presents with her mother who provides the history. Patient was in her usual state of health until after returning home from routine GJ tube exchange yesterday when her mother noticed the patient was diaphoretic and not acting like her usual self. She hasn't noticed specific changes, but feels shes more altered and appears distressed.  Vital signs on arrival to the ED include temperature 102.6*, BP 93/66, pulse 126 bpm, respirations 30 and SpO2 88%. Code sepsis was initiated and she was started on IVF resuscitation and IV Vancomycin, Cefepime and Metronidazole. Lactic acid 5.6. CMET with K 3.2, bicarb 17, glucose 132, albumin 2.9, AST 43, ALT 49 and anion gap 16. CBC without leukocytosis.   Portable CXR with poor inspiratory effort. CT abdomen/pelvis showed consolidation vs atelectasis in right lung base, dilated and fluid-filled esophagus suggesting reflux or dysmotility and GJT appeared to be in appropriate position. Also noted was mild wall-thickening of sigmoid colon and while no obvious bowel perforation was appreciated, repeat study with water-soluble bowel contrast was suggested for further evaluation. This study apparently had  suboptimal contrast administration however there was suspicion for localized contained perforation in the mid-abdomen. General surgery was consulted for evaluation. Case was discussed with CCM who were consulted due to severe sepsis with persistent tachycardia recommended Medicine Admission and reconsultation if patient were to require ventilatory or vasopressor support. IMTS was contacted for admission.   Meds:  Current Meds  Medication Sig  . acetaminophen (TYLENOL) 160 MG/5ML suspension Take 7.5 mLs by mouth every 4 (four) hours as needed for fever.  Marland Kitchen albuterol (PROVENTIL) (2.5 MG/3ML) 0.083% nebulizer solution Take 2.5 mg by nebulization every 4 (four) hours as needed (for wheezing, shortness of breath, or chest congestion).   . AMBULATORY NON FORMULARY MEDICATION Medication Name: Baclofen Suspension 10mg /ml  Take 2 mL by mouth every morning, 1.5 mL at midday, at 1 mL at bedtime (Patient taking differently: Medication Name: Baclofen Suspension 10mg /ml: 2 ml's per tube in the morning then 1.5 ml's midday then 1 ml at bedtime)  . clotrimazole-betamethasone (LOTRISONE) cream Apply 1 application topically 2 (two) times daily as needed (for scalp irritation).   Marland Kitchen FLUOCINOLONE ACETONIDE SCALP 0.01 % OIL Apply 1 application topically every 30 (thirty) days.   . Fluocinolone Acetonide Scalp 0.01 % OIL Apply 1 application topically See admin instructions. Apply to scalp once a week  . HYDROcodone-acetaminophen (HYCET) 7.5-325 mg/15 ml solution Take 15 mLs by mouth every 6 (six) hours as needed for moderate pain. (Patient taking differently: Place 7.5 mLs into feeding tube every 6 (six) hours as needed for moderate  pain. )  . ibuprofen (ADVIL,MOTRIN) 100 MG/5ML suspension Place 10 mLs into feeding tube every 6 (six) hours as needed for pain.  Marland Kitchen levETIRAcetam (KEPPRA) 100 MG/ML solution Give 5 ml by mouth in the morning and 7.19ml by mouth at night (Patient taking differently: Place 500-750 mg into feeding  tube See admin instructions. 500 mg per tube in the morning then 750 mg at bedtime)  . MedroxyPROGESTERone Acetate 150 MG/ML SUSY Inject 150 mg into the muscle every 3 (three) months.  Marland Kitchen omeprazole (PRILOSEC) 20 MG capsule 20 mg See admin instructions. 20 mg per tube three times a day   . PHENObarbital 20 MG/5ML elixir TAKE 2 AND 1/2 TEASPOONFULS TWICE A DAY  . TEGRETOL 100 MG/5ML suspension TAKE 3 TIMES A DAY (Patient taking differently: Place 220-240 mg into feeding tube See admin instructions. 11 ml's (220 mg) two times a day per tube and 12 ml's (240 mg) at 7:30 PM in the evening)   Allergies: Allergies as of 12-26-17 - Review Complete 12/26/2017  Allergen Reaction Noted  . Erythromycin Diarrhea 04/24/2017  . Azithromycin Diarrhea 04/13/2017   Past Medical History:  Diagnosis Date  . CP (cerebral palsy) (HCC)   . Seizures (HCC)   . Vision abnormalities    Family History: Patient unable to provide. Mother and father without major medical issue.   Social History: Patient does not smoke, drink or use recreational drugs. She lives at home with her mother who is her primary caretaker. Also has HH aide.   Review of Systems: A complete ROS was negative except as per HPI although was limited by patients AMS.  Physical Exam: Blood pressure 115/87, pulse (!) 140, temperature 98.7 F (37.1 C), temperature source Oral, resp. rate (!) 33, weight 53.5 kg, SpO2 100 %. General: Chronically-ill appearing AA female laying in bed. Fatigued. In no acute distress. Does not respond to questions or participate in examination.  HENT: Microcephaly. Tongue protrudes midline. Dry mucous membranes. PERRL. No conjunctival injection, icterus or ptosis. Cardiovascular: Tachycardia. No murmur or rub appreciated.  Pulmonary: Coarse breath sounds with rhonchi on anterior and lateral chest. Increased WOB and tachypniec. On 2L via [place]. Did not participate in pulmonary exam. Abdomen: Soft. No distention or  guarding. Diffusely tender. GJ tube in place with clean-dry dressings. No drainage at time of my evaluation.  Extremities: Contractures BL UE and LE. No peripheral edema. Skin: Warm. No cyanosis.  Neuro: Blind, non-verbal, chronic BL LE contractions.    EKG: personally reviewed my interpretation is sinus tachycardia, rate ~140. Normal PR interval and narrow QRS complex. Low voltage. TWI V1-V2, no prior to compare.   CXR: personally reviewed my interpretation is poor inspiratory effort with suboptimal inflation.   2017-12-26 CT abdomen/pelvis wo contrast: IMPRESSION: 1. Consolidation or atelectasis in the right lung base. Possible pneumonia or aspiration. 2. Dilated and fluid-filled esophagus suggesting reflux or dysmotility. 3. Gastric jejunostomy tube appears to be in place. Nonspecific gas in the epigastric region could be within decompressed stomach or it may indicate localized perforation. Suggest repeat study with water-soluble bowel contrast material to exclude localized perforation. 4. Mild wall thickening of the sigmoid colon may indicate focal colitis. Small amount of free fluid in the pelvis is likely reactive or inflammatory. 5. Ventricular shunt tubing is present with tip in the pelvis. 6. Postoperative changes in the hips and left acetabulum.  Urinalysis    Component Value Date/Time   COLORURINE AMBER (A) 2017-12-26 0413   APPEARANCEUR HAZY (A) 2017-12-26 0413  LABSPEC 1.040 (H) 12/12/2017 0413   PHURINE 5.0 12/14/2017 0413   GLUCOSEU NEGATIVE 12/04/2017 0413   HGBUR MODERATE (A) 11/16/2017 0413   BILIRUBINUR SMALL (A) 12/01/2017 0413   KETONESUR NEGATIVE 11/23/2017 0413   PROTEINUR 100 (A) 11/19/2017 0413   NITRITE NEGATIVE 12/05/2017 0413   LEUKOCYTESUR NEGATIVE 12/07/2017 0413   CBC    Component Value Date/Time   WBC 4.0 12/05/2017 0315   RBC 5.84 (H) 12/11/2017 0315   HGB 17.3 (H) 12/05/2017 0315   HCT 53.3 (H) 11/30/2017 0315   PLT 302 11/19/2017 0315   MCV  91.3 11/21/2017 0315   MCH 29.6 12/11/2017 0315   MCHC 32.5 11/22/2017 0315   RDW 13.3 12/12/2017 0315   LYMPHSABS 0.4 (L) 11/16/2017 0315   MONOABS 0.3 11/28/2017 0315   EOSABS 0.1 12/02/2017 0315   BASOSABS 0.0 11/20/2017 0315    CMP Latest Ref Rng & Units 12/02/2017 04/24/2017  Glucose 70 - 99 mg/dL 960(A) 87  BUN 6 - 20 mg/dL 8 5(L)  Creatinine 5.40 - 1.00 mg/dL 9.81 1.91(Y)  Sodium 782 - 145 mmol/L 137 137  Potassium 3.5 - 5.1 mmol/L 3.2(L) 3.7  Chloride 98 - 111 mmol/L 104 100(L)  CO2 22 - 32 mmol/L 17(L) 25  Calcium 8.9 - 10.3 mg/dL 9.5(A) 2.1(H)  Total Protein 6.5 - 8.1 g/dL 6.1(L) 6.7  Total Bilirubin 0.3 - 1.2 mg/dL 0.7 0.5  Alkaline Phos 38 - 126 U/L 65 79  AST 15 - 41 U/L 43(H) 23  ALT 0 - 44 U/L 49(H) 22   Lactic Acid, Venous    Component Value Date/Time   LATICACIDVEN 5.6 >> 4.91 (HH) 12/09/2017 0542   I/O last 3 completed shifts: In: 2000 [I.V.:2000] Out: -  No intake/output data recorded.  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 Also with recorded hypoxia to 88% which improved with 2L O2 via [place]. Her lactic acid improved only slightly with aggressive IVF although she remains significantly tachycardic. Case discussed with PCCM on admission: in setting of soft-normal BP with good MAP and no invasive ventilation is required, IMTS to admit but can re-consult should patient require vasopressor or advanced ventilatory support.  Work-up so far demonstrates UA without infection, CXR with poor inspiratory effort but suspicion of bibasilar opacities R>L, CT  abdomen/pelvis without free air but did demonstrate RLL consolidation, a dilated and fluid-filled esophagus consistent with dysphagia vs reflux, appropriate placement of GJ tube and non-specific gas pattern in epigastrium felt to represent either the decompressed stomach or a localized perforation. Repeat study with water-soluble contrast was sub-optimal but there was suggestion of contained perforation and additional imaging is currently underway.   In the setting of RLL infiltrate, hypoxia, dilated esophagus with retained liquid and AMS, suspect she has sepsis related to aspiration pneumonia although GJT infection or bowel perforation remain on differential as well given her abdominal pain. No discharge appreciated at time of my exam however general surgery did note some discharge around the tube with a somewhat sour odor. She will be continued on broad-spectrum antibiotics for now. Additional considerations include infectious colitis, pyelonephritis or encephalitis however these are felt  to be less-likely.   -Admit to SDU with close monitoring -Continue IV Vancomycin, Cefepime and Metronidazole -IVF: an additional 1L ns bolus ordered, then LR @ 150cc/hr  -Trend lactic acid -Follow-up general surgery recommendations & repeat contrast imaging -PCCM aware of patient, can re-consult if patient requires ventilatory or pressure support -Follow-up blood cultures -CBC in AM  Tachycardia, Tachypnea Patient remains tachycardic to 130's despite aggressive IVF on presentation although does seem to be improving slightly with an additional 1 L bolus. Reviewed EKG and telemetry showing sinus tachycardia without atrial fibrillation or flutter. EKG is low-voltage but no electrical alternans. Suspect tachycardia related to sepsis however myocarditis or pulmonary embolism is also considered.  -Repeat EKG  Cerebral Palsy, Spastic Quadriplegia, Dysphagia with PEG, Cognitive Impairment, Non-verbal, Cortical  Blindness Patient resides with her mother who seems supportive and appears to provide exceptional care. She also has a HH aide. Previous goals of care discussion indicate full-scope of care including tube-feeds, intubation and pressor support if required. Patient is quite ill, and would benefit from readdressing these topics with palliative care.  -Continue Baclofen and hydrocodone prn -PT, OT consults once patient stabilized -Palliative care consulted, appreciate their recommendations.   Dysphagia, GJT-Dependent Hypokalemia GJT routinely exchanged yesterday. CT shows esophageal dilation and liquid retention consistent with her diagnosis of dysphagia, or reflux. No drainage around site and so far imaging suggests appropriate placement and function. Currently holding tube feeds in setting of possible aspiration and perhaps need for OR.  -NPO for now -Nutrition consulted for assistance with management -Could consider SLP evaluation for aspiration risk? -K 3.2 on admit, replaced with 40mEq x2  Seizure Disorder Mother denies any seizure-like activity and reports adherence with AED's. Will continue Phenobarbital, Keppra and Carbamazepine at home dosing.   Hematuria UA with moderate Hgb and RBC 21-50. No history of hematuria on prior UA. Will need repeat UA with microscopy outpatient to ensure resolution.   Diet: NPO // Has GJT (OK to use for meds) IVF: S/p 3L NS in ED // Addn'l 1L bolus then LR @ 150cc/hr x 24 hrs DVT ppx: SCDs until final surgery recs // if no OR today, will start Lovenox Code status: FULL Consultations: General surgery, Palliative care, IR, Nutrition [Consider PT, OT consults once stabilized]  Dispo: Admit patient to Inpatient with expected length of stay greater than 2 midnights.  SignedNoemi Chapel: Maisey Deandrade, DO Feb 20, 2018, 8:26 AM  Pager: [phone]

## 2017-12-03 NOTE — Progress Notes (Signed)
Initial Nutrition Assessment  DOCUMENTATION CODES:   Not applicable  INTERVENTION:    Jevity 1.2 at 45 ml/h (1080 ml per day)  Pro-stat 30 ml once daily via tube  Provides 1396 kcal, 75 gm protein, 875 ml free water daily  NUTRITION DIAGNOSIS:   Inadequate oral intake related to inability to eat as evidenced by NPO status(home TF via G/J tube).  GOAL:   Patient will meet greater than or equal to 90% of their needs  MONITOR:   TF tolerance, Labs, I & O's  REASON FOR ASSESSMENT:   Consult Enteral/tube feeding initiation and management  ASSESSMENT:   27 yo female with PMH of cerebral palsy, intellectual disability, severe spastic quadriparesis, cortical blindness, dysphagia, G/J tube, and seizures who was admitted on 9/18 with sepsis, PNA, questionable aspiration.  Patient's aunt at bedside. She was unable to provide usual weight or usual TF regimen. Patient with contracted legs and hands, unable to assess muscle mass in those areas.  From review of weight encounters, patient has lost ~7 lbs within the past 7.5 months.   Labs reviewed. Potassium 3.2 (L) Medications reviewed and include KCl, antibiotics.   NUTRITION - FOCUSED PHYSICAL EXAM:    Most Recent Value  Orbital Region  No depletion  Upper Arm Region  No depletion  Thoracic and Lumbar Region  Unable to assess  Buccal Region  No depletion  Temple Region  No depletion  Clavicle Bone Region  No depletion  Clavicle and Acromion Bone Region  No depletion  Scapular Bone Region  No depletion  Dorsal Hand  Unable to assess  Patellar Region  Unable to assess  Anterior Thigh Region  Unable to assess  Posterior Calf Region  Unable to assess  Edema (RD Assessment)  Unable to assess  Hair  Reviewed  Eyes  Reviewed  Mouth  Unable to assess  Skin  Reviewed  Nails  Reviewed       Diet Order:   Diet Order            Diet NPO time specified  Diet effective now              EDUCATION NEEDS:   No  education needs have been identified at this time  Skin:  Skin Assessment: Reviewed RN Assessment  Last BM:  no BM documented  Height:   Ht Readings from Last 1 Encounters:  No data found for Ht    Weight:   Wt Readings from Last 1 Encounters:  12/10/2017 53.5 kg    Estimated Nutritional Needs:   Kcal:  1200-1400  Protein:  65-75 gm  Fluid:  1.5 L    Joaquin CourtsKimberly Alexica Schlossberg, RD, LDN, CNSC Pager [phone] After Hours Pager [phone]

## 2017-12-03 NOTE — Progress Notes (Addendum)
Spoke with physician about 66/18 on left arm.  86/77 on right arm.  HR has been up 140s.  Received order for NS 1000 ml IV bolos. HR still elevated around 145.  Hinton DyerYoko Favio Moder, RN

## 2017-12-04 ENCOUNTER — Encounter (HOSPITAL_COMMUNITY): Payer: Self-pay | Admitting: General Practice

## 2017-12-04 ENCOUNTER — Inpatient Hospital Stay (HOSPITAL_COMMUNITY): Payer: Medicare Other

## 2017-12-04 ENCOUNTER — Other Ambulatory Visit: Payer: Self-pay

## 2017-12-04 DIAGNOSIS — Z8701 Personal history of pneumonia (recurrent): Secondary | ICD-10-CM

## 2017-12-04 DIAGNOSIS — J181 Lobar pneumonia, unspecified organism: Secondary | ICD-10-CM

## 2017-12-04 DIAGNOSIS — H47619 Cortical blindness, unspecified side of brain: Secondary | ICD-10-CM

## 2017-12-04 DIAGNOSIS — Z934 Other artificial openings of gastrointestinal tract status: Secondary | ICD-10-CM

## 2017-12-04 DIAGNOSIS — I469 Cardiac arrest, cause unspecified: Secondary | ICD-10-CM

## 2017-12-04 DIAGNOSIS — Z79899 Other long term (current) drug therapy: Secondary | ICD-10-CM

## 2017-12-04 DIAGNOSIS — F72 Severe intellectual disabilities: Secondary | ICD-10-CM

## 2017-12-04 DIAGNOSIS — G40419 Other generalized epilepsy and epileptic syndromes, intractable, without status epilepticus: Secondary | ICD-10-CM

## 2017-12-04 DIAGNOSIS — K228 Other specified diseases of esophagus: Secondary | ICD-10-CM

## 2017-12-04 DIAGNOSIS — R1310 Dysphagia, unspecified: Secondary | ICD-10-CM

## 2017-12-04 LAB — COMPREHENSIVE METABOLIC PANEL
ALK PHOS: 38 U/L (ref 38–126)
ALT: 32 U/L (ref 0–44)
ANION GAP: 8 (ref 5–15)
AST: 25 U/L (ref 15–41)
Albumin: 2 g/dL — ABNORMAL LOW (ref 3.5–5.0)
BILIRUBIN TOTAL: 0.4 mg/dL (ref 0.3–1.2)
BUN: 10 mg/dL (ref 6–20)
CALCIUM: 7.3 mg/dL — AB (ref 8.9–10.3)
CO2: 18 mmol/L — ABNORMAL LOW (ref 22–32)
Chloride: 118 mmol/L — ABNORMAL HIGH (ref 98–111)
Creatinine, Ser: 0.47 mg/dL (ref 0.44–1.00)
GLUCOSE: 137 mg/dL — AB (ref 70–99)
Potassium: 4.6 mmol/L (ref 3.5–5.1)
Sodium: 144 mmol/L (ref 135–145)
TOTAL PROTEIN: 4.5 g/dL — AB (ref 6.5–8.1)

## 2017-12-04 LAB — CBC
HCT: 46.4 % — ABNORMAL HIGH (ref 36.0–46.0)
HEMOGLOBIN: 15 g/dL (ref 12.0–15.0)
MCH: 30.1 pg (ref 26.0–34.0)
MCHC: 32.3 g/dL (ref 30.0–36.0)
MCV: 93.2 fL (ref 78.0–100.0)
Platelets: 245 10*3/uL (ref 150–400)
RBC: 4.98 MIL/uL (ref 3.87–5.11)
RDW: 13.8 % (ref 11.5–15.5)
WBC: 4.8 10*3/uL (ref 4.0–10.5)

## 2017-12-04 LAB — GLUCOSE, CAPILLARY
GLUCOSE-CAPILLARY: 130 mg/dL — AB (ref 70–99)
Glucose-Capillary: 122 mg/dL — ABNORMAL HIGH (ref 70–99)
Glucose-Capillary: 135 mg/dL — ABNORMAL HIGH (ref 70–99)
Glucose-Capillary: 113 mg/dL — ABNORMAL HIGH (ref 70–99)
Glucose-Capillary: 76 mg/dL (ref 70–99)
Glucose-Capillary: 75 mg/dL (ref 70–99)

## 2017-12-04 LAB — URINE CULTURE

## 2017-12-04 LAB — ECHOCARDIOGRAM COMPLETE: WEIGHTICAEL: 1887.14 [oz_av]

## 2017-12-04 LAB — LACTIC ACID, PLASMA
Lactic Acid, Venous: 3.6 mmol/L (ref 0.5–1.9)
Lactic Acid, Venous: 2.3 mmol/L (ref 0.5–1.9)

## 2017-12-04 LAB — MRSA PCR SCREENING: MRSA by PCR: NEGATIVE

## 2017-12-04 LAB — VANCOMYCIN, TROUGH: VANCOMYCIN TR: 12 ug/mL — AB (ref 15–20)

## 2017-12-04 MED ORDER — IOHEXOL 300 MG/ML  SOLN: 75.0000 mL | Freq: Once | INTRAMUSCULAR | Status: DC

## 2017-12-04 MED ORDER — JEVITY 1.2 CAL PO LIQD: 1000.0000 mL | ORAL | Status: DC

## 2017-12-04 MED ORDER — HYDROCODONE-ACETAMINOPHEN 7.5-325 MG/15ML PO SOLN
15.0000 mL | Freq: Four times a day (QID) | ORAL | Status: DC
  Administered 2017-12-04 – 2017-12-06 (×8): 15 mL via ORAL
  Filled 2017-12-04 (×8): qty 15

## 2017-12-04 MED ORDER — PRO-STAT SUGAR FREE PO LIQD: 30.0000 mL | Freq: Every day | ORAL | Status: DC

## 2017-12-04 MED ORDER — LACTATED RINGERS IV SOLN: INTRAVENOUS | Status: DC

## 2017-12-04 MED ORDER — IOPAMIDOL (ISOVUE-300) INJECTION 61%
INTRAVENOUS | Status: AC
  Administered 2017-12-04: 75 mL via INTRAVENOUS
  Filled 2017-12-04: qty 30

## 2017-12-04 NOTE — Progress Notes (Addendum)
   Subjective:   Overnight: Remained tachycardic  [name] was examined and evaluated at bedside this AM with mother present. She is concerned about patient's tachycardia. Explained to mother about patient's pending blood culture, working diagnosis of pneumonia and her current antibiotic regimen. Patient's mother agrees that the patient does appear clinically improved. She states at home patient is NPO and for her tube feeds, she gets her feed at 110cc over 8 hours (3 bottles) Patient's mother also explains that in her previous epiode of pneumonia, she was treated with outpatient medications and was not as severe as this episode. She also states that the patient appears to be endorsing some abdominal discomfort.  Objective:  Vital signs in last 24 hours: Vitals:   12/04/17 0000 12/04/17 0400 12/04/17 0759 12/04/17 0857  BP: 101/69 111/72  127/89  Pulse: (!) 153 (!) 157 (!) 146 (!) 151  Resp: (!) 38 (!) 28 (!) 25 (!) 27  Temp: 99.1 F (37.3 C) 99.1 F (37.3 C)  (!) 100.8 F (38.2 C)  TempSrc: Axillary Axillary  Rectal  SpO2: 97% 94% 96% 96%  Weight:       Const: Unable to access if she is in distress but on palpation of abdomen, she opens her eyes but its quite difficult to ascertain CV: Tachycardic, normal S1,S2. 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 is a 27 year old woman with cerebral palsy, spastic quadriparesis, cortical blindness, dysphagia with GJT, and seizure disorder currently being managed for sepsis secondary to presumed aspiration pneumonia.   Severe sepsis 2/2 right lower lobe pneumonia, ?bowel  perforation: She remains non-verbal. Initial CXR revealed right lung infiltrate, CT Abdomen revealed consolidation of right lung base and possible localized perforation per general surgery. Per general surgery evaluation and on CT, she did not seem to have extravasation of contrast making the likelihood of perforation less. Today, she was noted to have a tense abdomen which was not present yesterday. Objectively, she remained afebrile overnight, tachycardic to 130s-150s, tachypneic with O2 saturation 95-98% on room air. This morning, she was febrile to 100.91F. BP has been stable with range 100s-120s/60s-80s. Peripheral perfusion is intact as skin is warm to touch. Lactic acid is down-trending 3.6<<3.5<<4.91<<5.58. Her persistent tachycardia can be due to both systemic and pain which have both been appropriately addressed. -Continue IV Vancomycin, Cefepime and Metronidazole -Trend lactic acid.  -F/u blood cultures  -F/u AM CBC -f/u Abdominal CT with contrast -f/u MRSA screen  Cerebral palsy: Continue Baclofen and hydrocodone q6 scheduled   Seizure disorder: Continue Phenobarbital, Keppra and Carbamazepine  Dispo: Anticipated discharge in approximately 3-4 day(s).   Yvette RackAgyei, Avyana Puffenbarger K, MD 12/04/2017, 11:16 AM Pager: [phone] IMTS PGY-1

## 2017-12-04 NOTE — Progress Notes (Signed)
Date: 12/04/2017  Patient name: Amber Curry  Medical record number: [id number]  Date of birth: 10/04/1990   I have seen and evaluated Amber Curry and discussed their care with the Residency Team.  [name] is a 27 year old female with cerebral palsy, intellectual disability, severe spastic quadriparesis, cortical blindness, dysphasia with a gastrojejunostomy tube, and a seizure disorder.  She is nonverbal so the history was obtained by the admitting team from the patient's mother.  She has had fevers for 1 day starting after her GJ tube exchange.  She was also diaphoretic and not acting like her usual self.  Initial work-up suggest a right lower as the etiology of her severe sepsis.  She is n.p.o. and gets all of her nutrition via the GJ tube.  The mother today states that she has had one other episode of mania but it was mild and treated as an outpatient.  The mother also feels that her daughter was experiencing abdominal pain after the GJ tube replacement.  Vitals:   12/04/17 0759 12/04/17 0857  BP:  127/89  Pulse: (!) 146 (!) 151  Resp: (!) 25 (!) 27  Temp:  (!) 100.8 F (38.2 C)  SpO2: 96% 96%  BP 127/89 T max 100.8 Gen lying on R side, will open eyes, non verbal, no purposeful movements H tachy so cannot appreciate a murmur, regular rhythm L rhonchi in R base ABD + BS, tense and distended (not her baseline per mother at bedside) Ext no edema, warm  CO2 17 - 18 Gap 16 - 8 LA 5.6 - 4.9 - 3.5 - 3.6 UA 6-10 Sq ep, neg WBC, nitrites, and LE Blood cx - x 24 hrs  I personally viewed the CXR images and confirmed my reading with the official read.  AP portable, semi erect, sig rotated, poor inspiration, no overt abnl  I personally viewed the EKG and confirmed my reading with the official read. Sinus tachy, nl axis, NSTW changes  CT ABD : Consolidation in the right lung base.  Dilated and fluid-filled esophagus.  Gastric jejunostomy tube in place.  Could not rule out a  localized gastric perforation.  Mild wall thickening in the sigmoid colon with questionable focal colitis.  Assessment and Plan: I have seen and evaluated the patient as outlined above. I agree with the formulated Assessment and Plan as detailed in the residents' note, with the following changes:  [name] is a 27 year old female with cerebral palsy, intellectual disability, severe spastic quadriparesis, cortical blindness, dysphasia with a gastrojejunostomy tube, and a seizure disorder who presents with severe sepsis most likely secondary to a right lower lobe pneumonia likely aspiration.  However, there was a concern for a gastric perforation on her imaging.  Prior to her fever she did have her GJ tube replaced.  And today, her abdomen is distended and firm which is new per the mother.  Therefore, we need to proceed with further imaging to verify or exclude a gastric.  1.  Severe sepsis -we will treat the underlying etiologies.  Her blood pressure was low on admission but with IV fluids, her blood pressure now seems to be at her baseline.  She remains severely tachycardic.  We are repeating her lactic acid level and will use the results to help guide with she needs further fluid resuscitation.  She does not appear dry on examination but does remain tachycardic which could just be secondary to her infection but could also be due to under fluid resuscitation.  2.  Right lower lobe pneumonia likely aspiration pneumonia -she currently is on vancomycin, metronidazole, and cefepime.  She has not had a MRSA screen which we will do and if negative, we will be able to stop the vancomycin.  Further narrow antibiotics as possible.  3.  Concerns for gastric perforation -surgery and IR was able to rule out a gastric perforation with an INR evaluated her GJ tube and injected contrast into the stomach and did not see any extraluminal extravasation.  However, she has had an acute change in her abdominal examination and  we have verified with the mother that this is not the baseline.  Therefore, we will proceed with water-soluble bowel contrast CT to further evaluate.  She is on good gram-negative and anaerobic coverage.  4.  Cerebral palsy -a palliative care consult was ordered on admission.  However, I do not feel that that is indicated at this time.  She has a severe but likely self-limited illness and I do not think having a goals of care discussion during an acute illness in which she is expected to recover is the best time being.  This can be done as an outpatient with physicians who know her.  Burns Spain, MD 9/19/201912:41 PM

## 2017-12-04 NOTE — Progress Notes (Signed)
Back to room from CT. Mouth, tongue and periorbital edema noted since contrast CT. Sats 95% now on 5L [place].

## 2017-12-04 NOTE — Progress Notes (Signed)
Spoke with DR again this AM since patient is still running in the 150's and occasionally as high as 167, B/P 100-110's/70's, also patient now has some rhonchi, unable to clear with yankeer, will ask for order for respiratory therapy to NT/OT suction as needed, no other changes note, will continue to monitor.

## 2017-12-04 NOTE — Plan of Care (Signed)
Patient is unable to care for herself due to cerebral palsy, but family stays with her at all times and assists in her care.

## 2017-12-04 NOTE — Progress Notes (Signed)
  Echocardiogram 2D Echocardiogram has been performed.  Janalyn HarderWest, Money Mckeithan R 12/04/2017, 10:31 AM

## 2017-12-04 NOTE — Consult Note (Signed)
Consultation Note Date: 12/04/2017   Patient Name: Amber Curry  DOB: 01/26/1991  MRN: [id number]  Age / Sex: 27 y.o., female  PCP: Laqueta DueFurr, Sara M., MD Referring Physician: Burns SpainButcher, Elizabeth A, MD  Reason for Consultation: Establishing goals of care  HPI/Patient Profile: 27 y.o. 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 On chart review, it is noted that the patient went for a feeding tube change was discharged home, subsequently developed sweating was given Tylenol and was not feeling well and was brought to the hospital. Initially there was concern for her GJ tube not functioning well, she was seen by surgery and interventional radiology. She was admitted for sepsis and pneumonia. Palliative care consult was requested for goals of care discussions.  Patient is resting in bed she is currently getting an echocardiogram done her caregivers present at the bedside.  Call placed and discussed with her mother Amber Curry at [phone]. I introduced palliative care as a supportive care, an extra layer of support.  Amber Curry states," that's my baby and I want her to get better." I reviewed with her the current scope of hospitalization, patient is on broad antibiotic coverage, any and all sources of possible infection are being worked up discussed with her about CT scan of the abdomen that has been ordered, echocardiogram that is being done.  I offered support and encouragement to the patient's mother primary caregiver, she does have some degree of caregiver burden. She does have some caregivers who come and relieve her at home and help out with care giving. I discussed with the patient's mother about taking respite breaks, encouraged self-care, encouraged her to try  getting enough sleep, take her medications and meals on time. Offered her support, voiced understanding, answered all of her questions to the best of my ability. Please note additional discussions/recommendations listed below.   NEXT OF KIN  mother Amber Curry.   SUMMARY OF RECOMMENDATIONS    full code, full scope of treatment Discussed about self care, caregiver stress management options with her mother.  PMT to sign off. Thank you for the consult.   Code Status/Advance Care Planning:  Full code    Symptom Management:    continue current mode of care.   Palliative Prophylaxis:   Bowel Regimen  Additional Recommendations (Limitations, Scope, Preferences):  Full Scope Treatment  Psycho-social/Spiritual:   Desire for further Chaplaincy support:yes  Additional Recommendations: Caregiving  Support/Resources  Prognosis:   Unable to determine  Discharge Planning: To Be Determined      Primary Diagnoses: Present on Admission: . Severe sepsis (HCC) . Severe intellectual disabilities . 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 Congenital quadriplegia (HCC)   I have reviewed the medical record, interviewed the patient and family, and examined the patient. The following aspects are pertinent.  Past Medical History:  Diagnosis Date  . CP (cerebral palsy) (HCC)   . Seizures (HCC)   .  Sepsis (HCC) 11/2017  . Vision abnormalities    Social History   Socioeconomic History  . Marital status: Single    Spouse name: Not on file  . Number of children: Not on file  . Years of education: Not on file  . Highest education level: Not on file  Occupational History  . Not on file  Social Needs  . Financial resource strain: Not on file  . Food insecurity:    Worry: Not on file    Inability: Not on file  . Transportation needs:    Medical: Not on file    Non-medical: Not on file  Tobacco Use  . Smoking status: Never Smoker  . Smokeless tobacco:  Never Used  Substance and Sexual Activity  . Alcohol use: No  . Drug use: No  . Sexual activity: Never  Lifestyle  . Physical activity:    Days per week: Not on file    Minutes per session: Not on file  . Stress: Not on file  Relationships  . Social connections:    Talks on phone: Not on file    Gets together: Not on file    Attends religious service: Not on file    Active member of club or organization: Not on file    Attends meetings of clubs or organizations: Not on file    Relationship status: Not on file  Other Topics Concern  . Not on file  Social History Narrative   Amber Curry stays at home and receives care from her mother and a home health aid.    History reviewed. No pertinent family history. Scheduled Meds: . baclofen  10 mg Per Tube QHS  . baclofen  15 mg Per Tube Q1400  . baclofen  20 mg Per Tube Daily  . carBAMazepine  220 mg Per Tube BID   And  . carBAMazepine  240 mg Per Tube Daily  . feeding supplement (PRO-STAT SUGAR FREE 64)  30 mL Per Tube Daily  . HYDROcodone-acetaminophen  15 mL Oral Q6H  . iopamidol      . levETIRAcetam  500 mg Per Tube Daily   And  . levETIRAcetam  750 mg Per Tube QHS  . pantoprazole sodium  40 mg Per Tube Daily  . PHENObarbital  50 mg Per Tube BID  . sodium chloride flush  10-40 mL Intracatheter Q12H   Continuous Infusions: . ceFEPime (MAXIPIME) IV 1 g (12/04/17 1232)  . feeding supplement (JEVITY 1.2 CAL) 45 mL/hr at 29-Dec-2017 2230  . lactated ringers 50 mL/hr at 12/04/17 1221  . metronidazole 500 mg (12/04/17 1322)  . vancomycin 500 mg (12/04/17 1049)   PRN Meds:.acetaminophen **OR** acetaminophen, albuterol, ondansetron **OR** ondansetron (ZOFRAN) IV, sodium chloride flush Medications Prior to Admission:  Prior to Admission medications   Medication Sig Start Date End Date Taking? Authorizing Provider  acetaminophen (TYLENOL) 160 MG/5ML suspension Take 7.5 mLs by mouth every 4 (four) hours as needed for fever. 06/28/16  Yes  [provider]  albuterol (PROVENTIL) (2.5 MG/3ML) 0.083% nebulizer solution Take 2.5 mg by nebulization every 4 (four) hours as needed (for wheezing, shortness of breath, or chest congestion).  04/13/17  Yes [provider]  AMBULATORY NON FORMULARY MEDICATION Medication Name: Baclofen Suspension 10mg /ml  Take 2 mL by mouth every morning, 1.5 mL at midday, at 1 mL at bedtime Patient taking differently: Medication Name: Baclofen Suspension 10mg /ml: 2 ml's per tube in the morning then 1.5 ml's midday then 1 ml at bedtime 05/21/17  Yes Elveria Rising, NP  clotrimazole-betamethasone (LOTRISONE) cream Apply 1 application topically 2 (two) times daily as needed (for scalp irritation).  01/02/14  Yes [provider]  FLUOCINOLONE ACETONIDE SCALP 0.01 % OIL Apply 1 application topically every 30 (thirty) days.  01/29/14  Yes [provider]  Fluocinolone Acetonide Scalp 0.01 % OIL Apply 1 application topically See admin instructions. Apply to scalp once a week 12/06/15  Yes [provider]  HYDROcodone-acetaminophen (HYCET) 7.5-325 mg/15 ml solution Take 15 mLs by mouth every 6 (six) hours as needed for moderate pain. Patient taking differently: Place 7.5 mLs into feeding tube every 6 (six) hours as needed for moderate pain.  08/24/15  Yes Lorre Nick, MD  ibuprofen (ADVIL,MOTRIN) 100 MG/5ML suspension Place 10 mLs into feeding tube every 6 (six) hours as needed for pain. 06/28/16  Yes [provider]  levETIRAcetam (KEPPRA) 100 MG/ML solution Give 5 ml by mouth in the morning and 7.40ml by mouth at night Patient taking differently: Place 500-750 mg into feeding tube See admin instructions. 500 mg per tube in the morning then 750 mg at bedtime 05/21/17  Yes Elveria Rising, NP  MedroxyPROGESTERone Acetate 150 MG/ML SUSY Inject 150 mg into the muscle every 3 (three) months. 04/02/16  Yes [provider]  omeprazole (PRILOSEC) 20 MG capsule 20 mg See  admin instructions. 20 mg per tube three times a day    Yes [provider]  PHENObarbital 20 MG/5ML elixir TAKE 2 AND 1/2 TEASPOONFULS TWICE A DAY 06/19/17  Yes Goodpasture, Inetta Fermo, NP  TEGRETOL 100 MG/5ML suspension TAKE 3 TIMES A DAY Patient taking differently: Place 220-240 mg into feeding tube See admin instructions. 11 ml's (220 mg) two times a day per tube and 12 ml's (240 mg) at 7:30 PM in the evening 08/05/17  Yes Goodpasture, Inetta Fermo, NP  AMBULATORY NON FORMULARY MEDICATION Medication Name: Glycopyrrolate 1mg  - compound to 1mg /49ml. Give 0.4ml every 8 hours (for drooling) 05/21/17   Elveria Rising, NP   Allergies  Allergen Reactions  . Erythromycin Diarrhea  . Azithromycin Diarrhea   Review of Systems Non verbal  Physical Exam Weak appearing Resting in bed She is getting an ECHO currently Mouth open Eyes closed Some edema Doesn't verbalize Abdomen firm Has PEG Is on tube feedings  Vital Signs: BP 127/89 (BP Location: Left Arm)   Pulse (!) 151   Temp (!) 100.8 F (38.2 C) (Rectal)   Resp (!) 27   Wt 53.5 kg   SpO2 96%  Pain Scale: Faces   Pain Score: 5    SpO2: SpO2: 96 % O2 Device:SpO2: 96 % O2 Flow Rate: .O2 Flow Rate (L/min): 2 L/min  IO: Intake/output summary:   Intake/Output Summary (Last 24 hours) at 12/04/2017 1347 Last data filed at 12/04/2017 0547 Gross per 24 hour  Intake 3406.03 ml  Output -  Net 3406.03 ml    LBM: Last BM Date: 12/01/17 Baseline Weight: Weight: 47.6 kg Most recent weight: Weight: 53.5 kg     Palliative Assessment/Data:   PPS 30%  Time In:  12 Time Out:  1300 Time Total:  60 min  Greater than 50%  of this time was spent counseling and coordinating care related to the above assessment and plan.  Signed by: Rosalin Hawking, MD [phone]   Please contact Palliative Medicine Team phone at [phone] for questions and concerns.  For individual provider: See Loretha Stapler

## 2017-12-04 NOTE — Progress Notes (Signed)
Called by charge RN to check on patient in room 06. Upon arrival RN and Md are in room. Patient HR in 140's with some facial swelling. Patient on [place] with Sats of 95%. MD stated to monitor her oxygen saturation and they will continue to monitor her. RN stated they will call if anything changes

## 2017-12-04 NOTE — Progress Notes (Signed)
Dr. Hinda GlatterAyegi into room to assess pt. Father updated with POC.

## 2017-12-04 NOTE — Final Consult Note (Signed)
IR checked position of G-J yesterday -  no evidence of catheter malposition, malfunction or extraluminal contrast extravasation. IR feels tube is safe to use. No surgical interventions indicated at this time. Surgery will sign off, call as needed.  Wells GuilesKelly Rayburn , [facility] 12/04/2017, 10:05 AM Pager: [phone] Consults: [phone] Mon-Fri 7:00 am-4:30 pm Sat-Sun 7:00 am-11:30 am

## 2017-12-04 NOTE — Progress Notes (Signed)
Paged Dr D/T inability to get tube feed tubing disconnected to be able to give po meds and after several RN's tried, MD along with me were able to get it apart with hemostats and pressure, now able to place a stopcock to give po meds via her tube, all tubing will be changed out for sterile purposes. Patient's HR elevated 140-150's and B/P high 90's to 110's/70's, no new orders received, will continue to monitor.

## 2017-12-04 NOTE — Progress Notes (Signed)
Paged by RN for increased oral secretion that were difficult to suction and requesting NT suction. Evaluated patient at bedside. [name] was resting comfortably. She is tachycardic with HR in 140-150s, normotensive with adequate MAPs and with normal respiratory rate. No gurgling appreciated. Caretaker at bedside who states patient's secretions are at baseline and she would like to avoid NT suctioning until patient's mother arrives this morning.   [name], MD  Internal Medicine PGY-2  P [phone]

## 2017-12-04 NOTE — Progress Notes (Signed)
Pt has completed 1 bottle of PO contrast for Abdominal CT. Pt appears uncomfortable with abdominal discomfort and distention. CT notified. Tube Feeds held for CT. Plan CT in an hour.

## 2017-12-04 NOTE — Progress Notes (Signed)
Critical Value -  Lactic = 2.3. Resident MD notified.

## 2017-12-04 NOTE — Plan of Care (Signed)
Lactic 2.3 

## 2017-12-05 DIAGNOSIS — D696 Thrombocytopenia, unspecified: Secondary | ICD-10-CM

## 2017-12-05 LAB — GLUCOSE, CAPILLARY
GLUCOSE-CAPILLARY: 77 mg/dL (ref 70–99)
GLUCOSE-CAPILLARY: 104 mg/dL — AB (ref 70–99)
GLUCOSE-CAPILLARY: 96 mg/dL (ref 70–99)
Glucose-Capillary: 100 mg/dL — ABNORMAL HIGH (ref 70–99)
Glucose-Capillary: 103 mg/dL — ABNORMAL HIGH (ref 70–99)
Glucose-Capillary: 93 mg/dL (ref 70–99)

## 2017-12-05 LAB — CBC
HEMATOCRIT: 39.6 % (ref 36.0–46.0)
HEMOGLOBIN: 12.6 g/dL (ref 12.0–15.0)
MCH: 30.1 pg (ref 26.0–34.0)
MCHC: 31.8 g/dL (ref 30.0–36.0)
MCV: 94.5 fL (ref 78.0–100.0)
Platelets: 122 10*3/uL — ABNORMAL LOW (ref 150–400)
RBC: 4.19 MIL/uL (ref 3.87–5.11)
RDW: 14 % (ref 11.5–15.5)
WBC: 9.5 10*3/uL (ref 4.0–10.5)

## 2017-12-05 LAB — BASIC METABOLIC PANEL
ANION GAP: 6 (ref 5–15)
BUN: 6 mg/dL (ref 6–20)
CALCIUM: 6.8 mg/dL — AB (ref 8.9–10.3)
CO2: 19 mmol/L — AB (ref 22–32)
Chloride: 117 mmol/L — ABNORMAL HIGH (ref 98–111)
Creatinine, Ser: 0.36 mg/dL — ABNORMAL LOW (ref 0.44–1.00)
GFR calc Af Amer: >60 mL/min (ref >60)
GFR calc non Af Amer: >60 mL/min (ref >60)
GLUCOSE: 97 mg/dL (ref 70–99)
Potassium: 3.9 mmol/L (ref 3.5–5.1)
Sodium: 142 mmol/L (ref 135–145)

## 2017-12-05 LAB — LACTIC ACID, PLASMA: Lactic Acid, Venous: 2 mmol/L (ref 0.5–1.9)

## 2017-12-05 LAB — CALCIUM, IONIZED: Calcium, Ionized, Serum: 4.6 mg/dL (ref 4.5–5.6)

## 2017-12-05 MED ORDER — LACTATED RINGERS IV SOLN
INTRAVENOUS | Status: DC
  Administered 2017-12-05 – 2017-12-08 (×3): via INTRAVENOUS

## 2017-12-05 MED ORDER — ALTEPLASE 2 MG IJ SOLR: 2.0000 mg | Freq: Once | INTRAMUSCULAR | Status: DC

## 2017-12-05 MED ORDER — PIPERACILLIN-TAZOBACTAM 3.375 G IVPB 30 MIN
3.3750 g | Freq: Three times a day (TID) | INTRAVENOUS | Status: DC
  Administered 2017-12-05 – 2017-12-06 (×4): 3.375 g via INTRAVENOUS
  Filled 2017-12-05 (×7): qty 50

## 2017-12-05 MED ORDER — ALTEPLASE 2 MG IJ SOLR
2.0000 mg | Freq: Once | INTRAMUSCULAR | Status: AC
  Administered 2017-12-05: 2 mg
  Filled 2017-12-05: qty 2

## 2017-12-05 NOTE — Plan of Care (Signed)
  Problem: Education: Goal: Knowledge of General Education information will improve Description: Including pain rating scale, medication(s)/side effects and non-pharmacologic comfort measures Outcome: Not Progressing   Problem: Activity: Goal: Risk for activity intolerance will decrease Outcome: Not Progressing   Problem: Nutrition: Goal: Adequate nutrition will be maintained Outcome: Not Progressing   

## 2017-12-05 NOTE — Progress Notes (Signed)
  Date: 12/05/2017  Patient name: Angela NevinConella S Marcou  Medical record number: [id number]  Date of birth: 01/13/1991   I have seen and evaluated this patient and I have discussed the plan of care with the house staff. Please see their note for complete details. I concur with their findings with the following additions/corrections: Ms. Lanae BoastGarner was seen on team morning rounds.  Today, she did not open her eyes during our examination. She is still tachypneic and tachycardic although her heart rate has decreased just slightly.  T-max is 100.8.  Her lactic acid is elevated but is downtrending.  Her bicarb is stable.  Platelets have decreased to 122. Her 4T score is 2, low prob for HIT.  Likely cause of her thrombocytopenia as her sepsis.  The etiology of her sepsis so far has just been her right lower lobe pneumonia, presumed aspiration.  Repeat imaging yesterday done because her abdomen was tense and distended, shows evidence that she did have a perforation but it has subsequently closed.  Surgery has evaluated the patient and feels no further intervention is required other than observation.  We will continue antibiotics and observe for response.  Burns SpainButcher, Elizabeth A, MD 12/05/2017, 1:04 PM

## 2017-12-05 NOTE — Progress Notes (Addendum)
[facility] Progress Note     Subjective: CC-  Patient asleep, father at bedside. Patient with persistent low grade fevers and tachycardia. BP currently stable and WBC 9.5. She is currently on maxipime and flagyl.  CT abdomen/pelvis repeated yesterday and showed no evidence of leak of the oral contrast in the peritoneal space, but did show a small amount of air surrounding the gastric antrum and duodenal bulb possibly suggesting a perforation that has now closed.   Objective: Vital signs in last 24 hours: Temp:  [99.5 F (37.5 C)-100.8 F (38.2 C)] 100.6 F (38.1 C) (09/20 0300) Pulse Rate:  [136-151] 141 (09/20 0732) Resp:  [22-27] 22 (09/20 0732) BP: (100-136)/(63-89) 118/72 (09/20 0732) SpO2:  [90 %-96 %] 93 % (09/20 0732) Last BM Date: 12/01/17  Intake/Output from previous day: 09/19 0701 - 09/20 0700 In: 806.5 [I.V.:10; NG/GT:348.8; IV Piggyback:447.6] Out: -  Intake/Output this shift: No intake/output data recorded.  PE: Gen:  Sleeping Card:  tachy Pulm:  CTAB, no W/R/R, effort normal Abd: Soft, mild distension, +BS, nontender, GJ-tube site cdi without erythema or drainage Ext:  Contractures noted to BLE/BUE Psych: nonverbal Skin: warm and dry  Lab Results:  Recent Labs    12/04/17 0014 12/05/17 0500  WBC 4.8 9.5  HGB 15.0 12.6  HCT 46.4* 39.6  PLT 245 122*   BMET Recent Labs    12/04/17 0014 12/05/17 0500  NA 144 142  K 4.6 3.9  CL 118* 117*  CO2 18* 19*  GLUCOSE 137* 97  BUN 10 6  CREATININE 0.47 0.36*  CALCIUM 7.3* 6.8*   PT/INR No results for input(s): LABPROT, INR in the last 72 hours. CMP     Component Value Date/Time   NA 142 12/05/2017 0500   K 3.9 12/05/2017 0500   CL 117 (H) 12/05/2017 0500   CO2 19 (L) 12/05/2017 0500   GLUCOSE 97 12/05/2017 0500   BUN 6 12/05/2017 0500   CREATININE 0.36 (L) 12/05/2017 0500   CALCIUM 6.8 (L) 12/05/2017 0500   PROT 4.5 (L) 12/04/2017 0014   ALBUMIN 2.0 (L) 12/04/2017 0014    AST 25 12/04/2017 0014   ALT 32 12/04/2017 0014   ALKPHOS 38 12/04/2017 0014   BILITOT 0.4 12/04/2017 0014   GFRNONAA >60 12/05/2017 0500   GFRAA >60 12/05/2017 0500   Lipase  No results found for: LIPASE     Studies/Results: Ct Abdomen Pelvis W Contrast  Result Date: 12/04/2017 CLINICAL DATA:  Fever.  Recent G  tube exchange EXAM: CT ABDOMEN AND PELVIS WITH CONTRAST TECHNIQUE: Multidetector CT imaging of the abdomen and pelvis was performed using the standard protocol following bolus administration of intravenous contrast. CONTRAST:  <See Chart> ISOVUE-300 IOPAMIDOL (ISOVUE-300) INJECTION 61%, <See Chart> OMNIPAQUE IOHEXOL 300 MG/ML SOLN COMPARISON:  G-tube evaluation 12/08/2017, CT 11/18/2017 FINDINGS: Lower chest: Bilateral pleural effusions and basilar atelectasis. Atelectasis dense. Hepatobiliary: No focal hepatic lesion. No biliary duct dilatation. Gallbladder normal. Common bile duct normal. Pancreas: Grossly normal.  No inflammation or fluid collections. Spleen: Normal spleen Adrenals/urinary tract: Adrenal glands normal. There is hypoattenuation within the cortex of the LEFT kidney (image 21/9 which may be artifact from the adjacent high density barium within the bowel. Ureters and bladder normal. Stomach/Bowel: Percutaneous gastrostomy tube with bulb within the gastric lumen. The J arm extension into the proximal small bowel. There is persistent gas surrounding the junction of the gastric antrum and duodenal bulb (image 27-21 of series 3). Similar to comparison CT exam. There is  extraluminal gas position between the stomach and the liver in the gastrohepatic ligament (image 18/9). This is also similar to comparison exam. Of note, there is no evidence of leak of the injected CT contrast. There is a moderate volume of free fluid in mesentery and pelvis which is low-density. The contrast injected contrast flows distally in the small bowel. There is some thickening of the second portion the  duodenum without obstruction. Contrast flows the RIGHT colon and LEFT colon and rectum. . Vascular/Lymphatic: Abdominal aortic normal caliber. No retroperitoneal periportal lymphadenopathy. Musculoskeletal: No aggressive osseous lesion IMPRESSION: 1. Persisting gas surrounding the gastric antrum and duodenal bulb which appears within the wall of the bowel as well as extraluminal gas. There is extraluminal gas present on comparison exam and collects in the gastrohepatic ligament. Of note, there is no evidence of leak of the oral contrast in the peritoneal space. This suggest there was a perforation which is now closed. 2. Several collection of fluid within the leaves of the mesentery without organization. Recommend follow-up CT scan to exclude early abscess formation 3. Dense bibasilar atelectasis and pleural effusions. 4. Low-density region in the LEFT kidney is favored artifact from adjacent high-density barium in the descending colon rather than pyelonephritis. Electronically Signed   By: Genevive BiStewart  Edmunds M.D.   On: 12/04/2017 15:31   Ir Cm Inj Any Colonic Tube W/fluoro  Result Date: 02/10/2018 INDICATION: Fever, sepsis, tachycardia. Single-lumen gastrojejunostomy tube exchange yesterday due to external catheter damage. CT demonstrated epigastric loculated gas of uncertain etiology. EXAM: GI TUBE INJECTION MEDICATIONS: None indicated ANESTHESIA/SEDATION: None required CONTRAST:  70 mL Isovue-administered into the gastric in small bowel lumen. PROCEDURE: Informed written consent was obtained from the parent after a thorough discussion of the procedural risks, benefits and alternatives. All questions were addressed. Maximal Sterile Barrier Technique was utilized including caps, mask, sterile gowns, sterile gloves, sterile drape, hand hygiene and skin antiseptic. A timeout was performed prior to the initiation of the procedure. Fluoroscopic inspection demonstrated appropriate position of the single-lumen  gastrojejunostomy catheter. Injection confirmed catheter patency without leak, tip in the decompressed proximal jejunum. The catheter was withdrawn over an angled stiff glidewire. Injection demonstrated appropriate catheter course through the decompressed stomach. No extravasation was identified. The tube is readvanced and the retention balloon inflated in the gastric lumen with 10 mL of dilute contrast under fluoroscopy. The external bumper was applied. The catheter was flushed. The patient tolerated the procedure well. FLUOROSCOPY TIME:  2 minutes 54 seconds; 13 mGy COMPLICATIONS: None immediate. IMPRESSION: 1. No evidence of catheter malposition, malfunction, or extraluminal contrast extravasation. Okay for routine use. Electronically Signed   By: Corlis Leak  Hassell M.D.   On: 011/26/2019 10:28   [place] Ekg Site Rite  Result Date: 02/10/2018 If Site Rite image not attached, placement could not be confirmed due to current cardiac rhythm.   Anti-infectives: Anti-infectives (From admission, onward)   Start     Dose/Rate Route Frequency Ordered Stop   01/29/2018 1800  vancomycin (VANCOCIN) IVPB 750 mg/150 ml premix  Status:  Discontinued     750 mg 150 mL/hr over 60 Minutes Intravenous Every 12 hours 01/29/2018 1120 01/29/2018 1123   01/29/2018 1800  vancomycin (VANCOCIN) 500 mg in sodium chloride 0.9 % 100 mL IVPB  Status:  Discontinued     500 mg 100 mL/hr over 60 Minutes Intravenous Every 8 hours 01/29/2018 1123 12/05/17 0633   01/29/2018 1300  vancomycin (VANCOCIN) 500 mg in sodium chloride 0.9 % 100 mL IVPB  Status:  Discontinued     500 mg 100 mL/hr over 60 Minutes Intravenous Every 8 hours 11/18/2017 0905 11/30/2017 1120   12/12/2017 1200  ceFEPIme (MAXIPIME) 1 g in sodium chloride 0.9 % 100 mL IVPB     1 g 200 mL/hr over 30 Minutes Intravenous Every 8 hours 11/23/2017 0905     12/13/2017 0400  ceFEPIme (MAXIPIME) 2 g in sodium chloride 0.9 % 100 mL IVPB     2 g 200 mL/hr over 30 Minutes Intravenous  Once 12/07/2017 0356  11/29/2017 0448   11/29/2017 0400  metroNIDAZOLE (FLAGYL) IVPB 500 mg     500 mg 100 mL/hr over 60 Minutes Intravenous Every 8 hours 11/26/2017 0356     11/23/2017 0400  vancomycin (VANCOCIN) IVPB 1000 mg/200 mL premix     1,000 mg 200 mL/hr over 60 Minutes Intravenous  Once 12/14/2017 0356 12/02/2017 0655       Assessment/Plan Microcephalus, congenital quadriplegia, cortical blindness Severe mental retardation/nonverbal Cerebral palsy RLL PNA Sepsis Gastrojejunostomy tube change with questionable perforation - CT abdomen/pelvis repeated yesterday and showed no evidence of leak of the oral contrast in the peritoneal space, but did show a small amount of air surrounding the gastric antrum and duodenal bulb possibly suggesting a perforation that has now closed. - No evidence of current leak from feeding tube, would not recommend any surgical intervention. Hold tube feeds 48 hours prior to restarting. Continue sepsis treatment/abx per primary.  ID - cefepime 9/18>>, flagyl 9/18>>, vanco 9/18>>9/20 FEN - NPO, IVF VTE - SCDs, per primary   LOS: 2 days    Franne Forts , [facility] 12/05/2017, 8:08 AM Pager: [phone] Consults: [phone] Mon 7:00 am -11:30 AM Tues-Fri 7:00 am-4:30 pm Sat-Sun 7:00 am-11:30 am

## 2017-12-05 NOTE — Plan of Care (Signed)
  Problem: Health Behavior/Discharge Planning: Goal: Ability to manage health-related needs will improve Outcome: Not Progressing   Problem: Clinical Measurements: Goal: Ability to maintain clinical measurements within normal limits will improve Outcome: Not Progressing   Problem: Clinical Measurements: Goal: Will remain free from infection Outcome: Not Progressing   Problem: Nutrition: Goal: Adequate nutrition will be maintained Outcome: Not Progressing

## 2017-12-05 NOTE — Progress Notes (Signed)
   Subjective:   Overnight: Remained tachycardic   [name] was examined and evaluated at bedside this AM with father present. Father states he was at bedside all night. Noticed 1 episode of distress but otherwise patient appears to have been doing well over night. Explained to the father about plan for continuing antibiotics for pneumonia and surgery consult based on CT findings. He expressed understanding.   Objective:  Vital signs in last 24 hours: Vitals:   12/05/17 0033 12/05/17 0300 12/05/17 0732 12/05/17 1121  BP: 112/65 114/66 118/72 113/60  Pulse: (!) 143 (!) 136 (!) 141 (!) 135  Resp: (!) 24 (!) 23 (!) 22 19  Temp: (!) 100.4 F (38 C) (!) 100.6 F (38.1 C)    TempSrc: Rectal Rectal    SpO2: 93% 96% 93% 95%  Weight:       Const: Comfortably lying in bed CV: Tachycardic, normal S1 and S2, no MGR Resp: Difficult to access due to body position but decreased breath sounds on the right lower lung.  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 is a 27 year old woman with cerebral palsy, spastic quadriparesis, cortical blindness, dysphagia with GJT, and seizure disorder currently being managed for sepsis secondary to presumed aspiration pneumonia and questionable intra-abdominal infection.   Severe sepsis  Right lower lobe pneumonia (aspiration vs CAP) Low grade fever with range 100.47F-100.6. Remains tachycardic with normal blood pressure. Continues to oxygenate well on supplemental O2 at 95% [facility]). No leukocytosis. Lactic acid down-trending 2<<2.3<<3.6 -Discontinue IV Cefepime and flagyl  -Start Zosyn q6 starting tonight (12/05/17)   -Blood culture NGTD -f/u  am CBC  -f/u Lactic acid   Questionable Bowel perforation:  Repeat CT abdomen with contrast revealed gas surrounding the gastric antrum and duodenal bulb but no evidence of leak of the oral contrast in the peritoneal space suggesting previous bowel perforation that has now closed. General surgery recommends to hold tube feeds for 48 hours. -Start tube feeds morning of 9/22 -Appreciate general surgery reccs   Cerebral palsy: Continue Baclofen and hydrocodone q6 scheduled   Seizure disorder: Continue Phenobarbital, Keppra and Carbamazepine  Dispo: Anticipated discharge in approximately 3-4 day(s).   Amber Curry, Amber Tangen K, MD 12/05/2017, 12:02 PM Pager: [phone] [facility]MTS PGY-1

## 2017-12-06 ENCOUNTER — Inpatient Hospital Stay (HOSPITAL_COMMUNITY): Payer: Medicare Other | Admitting: Certified Registered Nurse Anesthetist

## 2017-12-06 ENCOUNTER — Inpatient Hospital Stay (HOSPITAL_COMMUNITY): Payer: Medicare Other

## 2017-12-06 DIAGNOSIS — I469 Cardiac arrest, cause unspecified: Secondary | ICD-10-CM

## 2017-12-06 DIAGNOSIS — R4020 Unspecified coma: Secondary | ICD-10-CM

## 2017-12-06 LAB — GLUCOSE, CAPILLARY
GLUCOSE-CAPILLARY: 105 mg/dL — AB (ref 70–99)
GLUCOSE-CAPILLARY: 87 mg/dL (ref 70–99)
GLUCOSE-CAPILLARY: 99 mg/dL (ref 70–99)
Glucose-Capillary: 108 mg/dL — ABNORMAL HIGH (ref 70–99)
Glucose-Capillary: 78 mg/dL (ref 70–99)
Glucose-Capillary: 86 mg/dL (ref 70–99)
Glucose-Capillary: 112 mg/dL — ABNORMAL HIGH (ref 70–99)
Glucose-Capillary: 113 mg/dL — ABNORMAL HIGH (ref 70–99)
Glucose-Capillary: 124 mg/dL — ABNORMAL HIGH (ref 70–99)
Glucose-Capillary: 130 mg/dL — ABNORMAL HIGH (ref 70–99)

## 2017-12-06 LAB — TROPONIN I: Troponin I: 0.69 ng/mL (ref <0.03)

## 2017-12-06 LAB — POCT I-STAT 3, VENOUS BLOOD GAS (G3P V)
ACID-BASE DEFICIT: 6 mmol/L — AB (ref 0.0–2.0)
Acid-base deficit: 5 mmol/L — ABNORMAL HIGH (ref 0.0–2.0)
Bicarbonate: 22 mmol/L (ref 20.0–28.0)
Bicarbonate: 22.5 mmol/L (ref 20.0–28.0)
O2 Saturation: 91 %
O2 Saturation: 86 %
PCO2 VEN: 52.8 mmHg (ref 44.0–60.0)
PCO2 VEN: 49.6 mmHg (ref 44.0–60.0)
PH VEN: 7.23 — AB (ref 7.250–7.430)
PH VEN: 7.264 (ref 7.250–7.430)
Patient temperature: 99
TCO2: 24 mmol/L (ref 22–32)
TCO2: 24 mmol/L (ref 22–32)
pO2, Ven: 74 mmHg — ABNORMAL HIGH (ref 32.0–45.0)
pO2, Ven: 60 mmHg — ABNORMAL HIGH (ref 32.0–45.0)

## 2017-12-06 LAB — POCT I-STAT, CHEM 8
BUN: 4 mg/dL — ABNORMAL LOW (ref 6–20)
CALCIUM ION: 1.17 mmol/L (ref 1.15–1.40)
Chloride: 112 mmol/L — ABNORMAL HIGH (ref 98–111)
Creatinine, Ser: 0.7 mg/dL (ref 0.44–1.00)
Glucose, Bld: 124 mg/dL — ABNORMAL HIGH (ref 70–99)
HEMATOCRIT: 34 % — AB (ref 36.0–46.0)
Hemoglobin: 11.6 g/dL — ABNORMAL LOW (ref 12.0–15.0)
Potassium: 2.5 mmol/L — CL (ref 3.5–5.1)
Sodium: 148 mmol/L — ABNORMAL HIGH (ref 135–145)
TCO2: 23 mmol/L (ref 22–32)

## 2017-12-06 LAB — BASIC METABOLIC PANEL
Anion gap: 8 (ref 5–15)
Anion gap: 5 (ref 5–15)
BUN: 5 mg/dL — ABNORMAL LOW (ref 6–20)
BUN: 5 mg/dL — ABNORMAL LOW (ref 6–20)
CALCIUM: 7.6 mg/dL — AB (ref 8.9–10.3)
CO2: 25 mmol/L (ref 22–32)
CO2: 25 mmol/L (ref 22–32)
Calcium: 7.6 mg/dL — ABNORMAL LOW (ref 8.9–10.3)
Chloride: 114 mmol/L — ABNORMAL HIGH (ref 98–111)
Chloride: 117 mmol/L — ABNORMAL HIGH (ref 98–111)
Creatinine, Ser: 0.61 mg/dL (ref 0.44–1.00)
Creatinine, Ser: 0.74 mg/dL (ref 0.44–1.00)
GFR calc Af Amer: >60 mL/min (ref >60)
GFR calc Af Amer: >60 mL/min (ref >60)
GFR calc non Af Amer: >60 mL/min (ref >60)
GFR calc non Af Amer: >60 mL/min (ref >60)
Glucose, Bld: 121 mg/dL — ABNORMAL HIGH (ref 70–99)
Glucose, Bld: 100 mg/dL — ABNORMAL HIGH (ref 70–99)
Potassium: 3.2 mmol/L — ABNORMAL LOW (ref 3.5–5.1)
Potassium: 3.2 mmol/L — ABNORMAL LOW (ref 3.5–5.1)
Sodium: 147 mmol/L — ABNORMAL HIGH (ref 135–145)
Sodium: 147 mmol/L — ABNORMAL HIGH (ref 135–145)

## 2017-12-06 LAB — COMPREHENSIVE METABOLIC PANEL
ALT: 26 U/L (ref 0–44)
AST: 27 U/L (ref 15–41)
Albumin: 1.9 g/dL — ABNORMAL LOW (ref 3.5–5.0)
Alkaline Phosphatase: 98 U/L (ref 38–126)
Anion gap: 10 (ref 5–15)
BUN: 7 mg/dL (ref 6–20)
CO2: 21 mmol/L — AB (ref 22–32)
Calcium: 7.5 mg/dL — ABNORMAL LOW (ref 8.9–10.3)
Chloride: 115 mmol/L — ABNORMAL HIGH (ref 98–111)
Creatinine, Ser: 0.83 mg/dL (ref 0.44–1.00)
Glucose, Bld: 112 mg/dL — ABNORMAL HIGH (ref 70–99)
Potassium: 3 mmol/L — ABNORMAL LOW (ref 3.5–5.1)
SODIUM: 146 mmol/L — AB (ref 135–145)
Total Bilirubin: 1 mg/dL (ref 0.3–1.2)
Total Protein: 4.6 g/dL — ABNORMAL LOW (ref 6.5–8.1)

## 2017-12-06 LAB — LACTIC ACID, PLASMA: Lactic Acid, Venous: 1.7 mmol/L (ref 0.5–1.9)

## 2017-12-06 LAB — CBC
HCT: 39.6 % (ref 36.0–46.0)
HCT: 36.5 % (ref 36.0–46.0)
HEMATOCRIT: 41.2 % (ref 36.0–46.0)
HEMOGLOBIN: 12.3 g/dL (ref 12.0–15.0)
Hemoglobin: 12.5 g/dL (ref 12.0–15.0)
Hemoglobin: 11.7 g/dL — ABNORMAL LOW (ref 12.0–15.0)
MCH: 29.7 pg (ref 26.0–34.0)
MCH: 30.1 pg (ref 26.0–34.0)
MCH: 29.9 pg (ref 26.0–34.0)
MCHC: 30.3 g/dL (ref 30.0–36.0)
MCHC: 31.1 g/dL (ref 30.0–36.0)
MCHC: 32.1 g/dL (ref 30.0–36.0)
MCV: 97.9 fL (ref 78.0–100.0)
MCV: 96.8 fL (ref 78.0–100.0)
MCV: 93.4 fL (ref 78.0–100.0)
Platelets: 175 10*3/uL (ref 150–400)
Platelets: 133 10*3/uL — ABNORMAL LOW (ref 150–400)
Platelets: 127 K/uL — ABNORMAL LOW (ref 150–400)
RBC: 4.21 MIL/uL (ref 3.87–5.11)
RBC: 4.09 MIL/uL (ref 3.87–5.11)
RBC: 3.91 MIL/uL (ref 3.87–5.11)
RDW: 14.4 % (ref 11.5–15.5)
RDW: 14.4 % (ref 11.5–15.5)
RDW: 14.2 % (ref 11.5–15.5)
WBC: 8.6 10*3/uL (ref 4.0–10.5)
WBC: 10.4 10*3/uL (ref 4.0–10.5)
WBC: 10.2 K/uL (ref 4.0–10.5)

## 2017-12-06 LAB — BASIC METABOLIC PANEL WITH GFR
Anion gap: 7 (ref 5–15)
BUN: 6 mg/dL (ref 6–20)
CO2: 23 mmol/L (ref 22–32)
Calcium: 7.5 mg/dL — ABNORMAL LOW (ref 8.9–10.3)
Chloride: 116 mmol/L — ABNORMAL HIGH (ref 98–111)
Creatinine, Ser: 0.79 mg/dL (ref 0.44–1.00)
GFR calc Af Amer: >60 mL/min (ref >60)
GFR calc non Af Amer: >60 mL/min (ref >60)
Glucose, Bld: 97 mg/dL (ref 70–99)
Potassium: 3 mmol/L — ABNORMAL LOW (ref 3.5–5.1)
Sodium: 146 mmol/L — ABNORMAL HIGH (ref 135–145)

## 2017-12-06 LAB — APTT
aPTT: 50 seconds — ABNORMAL HIGH (ref 24–36)
aPTT: 51 s — ABNORMAL HIGH (ref 24–36)

## 2017-12-06 LAB — POCT I-STAT 3, ART BLOOD GAS (G3+)
Acid-base deficit: 4 mmol/L — ABNORMAL HIGH (ref 0.0–2.0)
Bicarbonate: 21.9 mmol/L (ref 20.0–28.0)
O2 Saturation: 99 %
Patient temperature: 33.5
TCO2: 23 mmol/L (ref 22–32)
pCO2 arterial: 36.5 mmHg (ref 32.0–48.0)
pH, Arterial: 7.37 (ref 7.350–7.450)
pO2, Arterial: 108 mmHg (ref 83.0–108.0)

## 2017-12-06 LAB — MRSA PCR SCREENING: MRSA by PCR: NEGATIVE

## 2017-12-06 LAB — PROTIME-INR
INR: 2.35
INR: 2.44
INR: 2.44
Prothrombin Time: 25.5 seconds — ABNORMAL HIGH (ref 11.4–15.2)
Prothrombin Time: 26.3 seconds — ABNORMAL HIGH (ref 11.4–15.2)
Prothrombin Time: 26.3 seconds — ABNORMAL HIGH (ref 11.4–15.2)

## 2017-12-06 MED ORDER — CHLORHEXIDINE GLUCONATE 0.12% ORAL RINSE (MEDLINE KIT)
15.0000 mL | Freq: Two times a day (BID) | OROMUCOSAL | Status: DC
  Administered 2017-12-06 – 2017-12-15 (×18): 15 mL via OROMUCOSAL

## 2017-12-06 MED ORDER — SODIUM CHLORIDE 0.9 % IV SOLN
2.0000 mg/h | INTRAVENOUS | Status: DC
  Administered 2017-12-06 – 2017-12-07 (×2): 2 mg/h via INTRAVENOUS
  Filled 2017-12-06 (×2): qty 10

## 2017-12-06 MED ORDER — FENTANYL 2500MCG IN NS 250ML (10MCG/ML) PREMIX INFUSION
100.0000 ug/h | INTRAVENOUS | Status: DC
  Administered 2017-12-06 – 2017-12-07 (×2): 100 ug/h via INTRAVENOUS
  Filled 2017-12-06: qty 250

## 2017-12-06 MED ORDER — NOREPINEPHRINE 4 MG/250ML-% IV SOLN
0.0000 ug/min | INTRAVENOUS | Status: DC
  Administered 2017-12-06: 2 ug/min via INTRAVENOUS
  Administered 2017-12-07: 5 ug/min via INTRAVENOUS
  Administered 2017-12-08: 12 ug/min via INTRAVENOUS
  Administered 2017-12-08: 13 ug/min via INTRAVENOUS
  Filled 2017-12-06 (×4): qty 250

## 2017-12-06 MED ORDER — MIDAZOLAM HCL 2 MG/2ML IJ SOLN: 2.0000 mg | INTRAMUSCULAR | Status: DC | PRN

## 2017-12-06 MED ORDER — SODIUM CHLORIDE 0.9 % IV SOLN
1.0000 ug/kg/min | INTRAVENOUS | Status: DC
  Administered 2017-12-06: 1 ug/kg/min via INTRAVENOUS
  Filled 2017-12-06: qty 20

## 2017-12-06 MED ORDER — CISATRACURIUM BOLUS VIA INFUSION
0.0500 mg/kg | INTRAVENOUS | Status: DC | PRN
  Filled 2017-12-06: qty 3

## 2017-12-06 MED ORDER — SODIUM CHLORIDE 0.9 % IV SOLN
750.0000 mg | Freq: Two times a day (BID) | INTRAVENOUS | Status: DC
  Administered 2017-12-06: 750 mg via INTRAVENOUS
  Filled 2017-12-06: qty 7.5

## 2017-12-06 MED ORDER — ORAL CARE MOUTH RINSE: 15.0000 mL | OROMUCOSAL | Status: DC

## 2017-12-06 MED ORDER — MIDAZOLAM HCL 2 MG/2ML IJ SOLN: 2.0000 mg | Freq: Once | INTRAMUSCULAR | Status: DC

## 2017-12-06 MED ORDER — CHLORHEXIDINE GLUCONATE 0.12 % MT SOLN
OROMUCOSAL | Status: AC
  Administered 2017-12-06: 15 mL via OROMUCOSAL
  Filled 2017-12-06: qty 15

## 2017-12-06 MED ORDER — SODIUM CHLORIDE 0.9 % IV SOLN
INTRAVENOUS | Status: DC
  Administered 2017-12-06 (×2): via INTRAVENOUS

## 2017-12-06 MED ORDER — POTASSIUM CHLORIDE 10 MEQ/100ML IV SOLN
10.0000 meq | INTRAVENOUS | Status: AC
  Administered 2017-12-06 (×3): 10 meq via INTRAVENOUS
  Filled 2017-12-06 (×3): qty 100

## 2017-12-06 MED ORDER — LEVETIRACETAM 100 MG/ML PO SOLN
500.0000 mg | Freq: Every day | ORAL | Status: DC
  Administered 2017-12-07 – 2017-12-16 (×10): 500 mg
  Filled 2017-12-06 (×12): qty 5

## 2017-12-06 MED ORDER — HEPARIN SODIUM (PORCINE) 5000 UNIT/ML IJ SOLN
5000.0000 [IU] | Freq: Three times a day (TID) | INTRAMUSCULAR | Status: DC
  Administered 2017-12-06: 5000 [IU] via SUBCUTANEOUS
  Filled 2017-12-06: qty 1

## 2017-12-06 MED ORDER — SODIUM CHLORIDE 0.9 % IV SOLN
INTRAVENOUS | Status: DC
  Administered 2017-12-06 – 2017-12-15 (×3): via INTRAVENOUS

## 2017-12-06 MED ORDER — POTASSIUM CHLORIDE 10 MEQ/50ML IV SOLN
10.0000 meq | Freq: Once | INTRAVENOUS | Status: AC
  Administered 2017-12-06: 10 meq via INTRAVENOUS

## 2017-12-06 MED ORDER — PIPERACILLIN-TAZOBACTAM 3.375 G IVPB
3.3750 g | Freq: Three times a day (TID) | INTRAVENOUS | Status: AC
  Administered 2017-12-07 – 2017-12-12 (×18): 3.375 g via INTRAVENOUS
  Filled 2017-12-06 (×18): qty 50

## 2017-12-06 MED ORDER — PANTOPRAZOLE SODIUM 40 MG PO PACK
40.0000 mg | PACK | Freq: Every day | ORAL | Status: DC
  Administered 2017-12-06 – 2017-12-15 (×10): 40 mg
  Filled 2017-12-06 (×9): qty 20

## 2017-12-06 MED ORDER — MIDAZOLAM BOLUS VIA INFUSION
2.0000 mg | INTRAVENOUS | Status: DC | PRN
  Filled 2017-12-06: qty 2

## 2017-12-06 MED ORDER — ASPIRIN 300 MG RE SUPP
300.0000 mg | RECTAL | Status: AC
  Administered 2017-12-06: 300 mg via RECTAL
  Filled 2017-12-06: qty 1

## 2017-12-06 MED ORDER — FENTANYL 2500MCG IN NS 250ML (10MCG/ML) PREMIX INFUSION
25.0000 ug/h | INTRAVENOUS | Status: DC
  Administered 2017-12-06: 25 ug/h via INTRAVENOUS
  Filled 2017-12-06: qty 250

## 2017-12-06 MED ORDER — FENTANYL BOLUS VIA INFUSION
50.0000 ug | INTRAVENOUS | Status: DC | PRN
  Filled 2017-12-06: qty 50

## 2017-12-06 MED ORDER — LEVETIRACETAM 100 MG/ML PO SOLN
750.0000 mg | Freq: Every day | ORAL | Status: DC
  Administered 2017-12-06 – 2017-12-15 (×10): 750 mg
  Filled 2017-12-06 (×12): qty 7.5

## 2017-12-06 MED ORDER — IOPAMIDOL (ISOVUE-370) INJECTION 76%
INTRAVENOUS | Status: AC
  Administered 2017-12-06: 80 mL
  Filled 2017-12-06: qty 100

## 2017-12-06 MED ORDER — ORAL CARE MOUTH RINSE
15.0000 mL | OROMUCOSAL | Status: DC
  Administered 2017-12-06 – 2017-12-15 (×90): 15 mL via OROMUCOSAL

## 2017-12-06 MED ORDER — FENTANYL CITRATE (PF) 100 MCG/2ML IJ SOLN: 100.0000 ug | Freq: Once | INTRAMUSCULAR | Status: DC

## 2017-12-06 MED ORDER — CISATRACURIUM BOLUS VIA INFUSION
0.1000 mg/kg | Freq: Once | INTRAVENOUS | Status: AC
  Administered 2017-12-06: 5.4 mg via INTRAVENOUS
  Filled 2017-12-06: qty 6

## 2017-12-06 MED ORDER — PHENOBARBITAL SODIUM 65 MG/ML IJ SOLN: 50.0000 mg | Freq: Two times a day (BID) | INTRAMUSCULAR | Status: DC

## 2017-12-06 MED ORDER — HEPARIN SODIUM (PORCINE) 5000 UNIT/ML IJ SOLN
5000.0000 [IU] | Freq: Three times a day (TID) | INTRAMUSCULAR | Status: DC
  Administered 2017-12-06 – 2017-12-15 (×27): 5000 [IU] via SUBCUTANEOUS
  Filled 2017-12-06 (×27): qty 1

## 2017-12-06 MED ORDER — ARTIFICIAL TEARS OPHTHALMIC OINT
1.0000 "application " | TOPICAL_OINTMENT | Freq: Three times a day (TID) | OPHTHALMIC | Status: DC
  Administered 2017-12-06 – 2017-12-15 (×27): 1 via OPHTHALMIC
  Filled 2017-12-06 (×5): qty 3.5

## 2017-12-06 MED ORDER — PHENOBARBITAL 20 MG/5ML PO ELIX
50.0000 mg | ORAL_SOLUTION | Freq: Two times a day (BID) | ORAL | Status: DC
  Administered 2017-12-06 – 2017-12-16 (×20): 50 mg
  Filled 2017-12-06 (×20): qty 15

## 2017-12-06 MED ORDER — FAMOTIDINE IN NACL 20-0.9 MG/50ML-% IV SOLN: 20.0000 mg | Freq: Two times a day (BID) | INTRAVENOUS | Status: DC

## 2017-12-06 MED ORDER — CHLORHEXIDINE GLUCONATE 0.12% ORAL RINSE (MEDLINE KIT)
15.0000 mL | Freq: Two times a day (BID) | OROMUCOSAL | Status: DC
  Administered 2017-12-06: 15 mL via OROMUCOSAL

## 2017-12-06 MED ORDER — POTASSIUM CHLORIDE 10 MEQ/50ML IV SOLN
INTRAVENOUS | Status: AC
  Filled 2017-12-06: qty 50

## 2017-12-06 MED ORDER — FENTANYL CITRATE (PF) 100 MCG/2ML IJ SOLN: 50.0000 ug | Freq: Once | INTRAMUSCULAR | Status: DC

## 2017-12-06 MED ORDER — POTASSIUM CHLORIDE 10 MEQ/50ML IV SOLN
10.0000 meq | INTRAVENOUS | Status: AC
  Administered 2017-12-06 – 2017-12-07 (×4): 10 meq via INTRAVENOUS
  Filled 2017-12-06 (×4): qty 50

## 2017-12-06 MED ORDER — FAMOTIDINE IN NACL 20-0.9 MG/50ML-% IV SOLN: 20.0000 mg | INTRAVENOUS | Status: DC

## 2017-12-06 MED FILL — Medication: Qty: 1 | Status: AC

## 2017-12-06 NOTE — Progress Notes (Addendum)
NT x 2 into room bathing pt when pt became unresponsive and pulseless. Asystole on monitor. Code blue called. Chest compressions started. Code team into room. Mother being notified per caregiver.

## 2017-12-06 NOTE — Progress Notes (Signed)
eLink Physician-Brief Progress Note Patient Name: Amber NevinConella S Curry DOB: 08/21/1990 MRN: [id number]   Date of Service  12/06/2017  HPI/Events of Note  Troponin = 0.69 in setting of cardiac arrest and CPR. Patient is already on ASA.   eICU Interventions  Will continue to trend Troponin.      Intervention Category Intermediate Interventions: Diagnostic test evaluation  Amber Curry 12/06/2017, 9:11 PM

## 2017-12-06 NOTE — Anesthesia Procedure Notes (Signed)
Procedure Name: Intubation Date/Time: 12/06/2017 10:10 AM Performed by: Airyonna Franklyn T, CRNA Pre-anesthesia Checklist: Patient identified, Emergency Drugs available, Suction available and Patient being monitored Patient Re-evaluated:Patient Re-evaluated prior to induction Oxygen Delivery Method: Ambu bag Preoxygenation: Pre-oxygenation with 100% oxygen Induction Type: Rapid sequence Ventilation: Mask ventilation without difficulty Laryngoscope Size: Mac and 3 Grade View: Grade II Tube type: Subglottic suction tube Tube size: 7.5 mm Number of attempts: 1 Airway Equipment and Method: Stylet Placement Confirmation: ETT inserted through vocal cords under direct vision,  positive ETCO2 and breath sounds checked- equal and bilateral Secured at: 23 cm Tube secured with: Tape Dental Injury: Teeth and Oropharynx as per pre-operative assessment

## 2017-12-06 NOTE — Progress Notes (Signed)
EEG Completed; Results Pending  

## 2017-12-06 NOTE — Procedures (Signed)
Arterial Catheter Insertion Procedure Note Amber Curry [id number] 12/13/1990  Procedure: Insertion of Arterial Catheter  Indications: Blood pressure monitoring  Procedure Details Consent: Unable to obtain consent because of altered level of consciousness. Time Out: Verified patient identification, verified procedure, site/side was marked, verified correct patient position, special equipment/implants available, medications/allergies/relevent history reviewed, required imaging and test results available.  Performed  Maximum sterile technique was used including antiseptics, cap, gloves, hand hygiene, mask and sheet. Skin prep: Chlorhexidine; local anesthetic administered 20 gauge catheter was inserted into right femoral artery using the Seldinger technique. ULTRASOUND GUIDANCE USED: NO Evaluation Blood flow good; BP tracing good. Complications: No apparent complications.   Amber SnareMichael B Quaron Curry 12/06/2017

## 2017-12-06 NOTE — Progress Notes (Signed)
  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)   Gastrostomy tube dependent (HCC)   CP (cerebral palsy) (HCC)  GJ tube with concern for leak, contrast study negative -continue to hold tube feeds for an additional 24h with plan to restart tomorrow -continue empiric abx    LOS: 3 days  Chief Complaint/Subjective: Noted swelling increased since yesterday  Objective: Vital signs in last 24 hours: Temp:  [99.1 F (37.3 C)-99.3 F (37.4 C)] 99.2 F (37.3 C) (09/21 0750) Pulse Rate:  [132-142] 140 (09/21 0750) Resp:  [12-34] 20 (09/21 0750) BP: (111-121)/(60-72) 111/61 (09/21 0750) SpO2:  [92 %-95 %] 92 % (09/21 0750) Last BM Date: 12/01/17  Intake/Output from previous day: 09/20 0701 - 09/21 0700 In: 1097.2 [I.V.:961.9; IV Piggyback:105.3] Out: -  Intake/Output this shift: No intake/output data recorded.  Gen: somnolent  Abd: no erythema or drainage around tube  Extremities: +edema  Studies/Results:  n/a   Rodman PickleLuke Aaron Jaelle Campanile, MD [facility]

## 2017-12-06 NOTE — Progress Notes (Signed)
A code was called on the patient. Upon arrival, ACLS protocol was in place. The patient was intubated by Anesthesia and ROSC was achieved. The patient was transferred to 2H06.

## 2017-12-06 NOTE — Procedures (Signed)
  ELECTROENCEPHALOGRAM REPORT  Date of Study: 12/06/17  Patient's Name: Amber Curry S Henricks MRN: [id number] Date of Birth: 01/10/1991  Referring Provider: Jannet AskewMichael Miller, MD  Clinical History: Amber Curry S Vicknair is a 27 y.o. female with cerebral palsy complicated by intellectual disability, severe spastic quadriparesis, cortical blindness, dysphagia with GJT, and seizure disorder who presents for evaluation of fevers x1 day. She presents with her mother who provides the history. Patient was in her usual state of health until after returning home from routine GJ tube exchange yesterday when her mother noticed the patient was diaphoretic and not acting like her usual self. She hasn't noticed specific changes, but feels shes more altered and appears distressed.  Code blue activated this afternoon and patient now intubated.  Head CT pending.    Medications: Scheduled Meds: . baclofen  10 mg Per Tube QHS  . baclofen  15 mg Per Tube Q1400  . baclofen  20 mg Per Tube Daily  . carBAMazepine  220 mg Per Tube BID   And  . carBAMazepine  240 mg Per Tube Daily  . chlorhexidine      . fentaNYL (SUBLIMAZE) injection  50 mcg Intravenous Once  . heparin  5,000 Units Subcutaneous Q8H  . iohexol  75 mL Intravenous Once  . levETIRAcetam  500 mg Per Tube Daily  . levETIRAcetam  750 mg Per Tube QHS  . pantoprazole sodium  40 mg Per Tube Daily  . PHENObarbital  50 mg Per Tube BID  . sodium chloride flush  10-40 mL Intracatheter Q12H   Continuous Infusions: . sodium chloride 60 mL/hr at 12/06/17 1500  . fentaNYL infusion INTRAVENOUS 25 mcg/hr (12/06/17 1502)  . lactated ringers 50 mL/hr at 12/06/17 0618  . piperacillin-tazobactam 3.375 g (12/06/17 0552)   PRN Meds:.acetaminophen **OR** acetaminophen, albuterol, fentaNYL, midazolam, midazolam, ondansetron **OR** ondansetron (ZOFRAN) IV, sodium chloride flush            Technical Summary: This is a standard 16 channel EEG recording performed according  to the international 10-20 electrode system.  AP bipolar, transverse bipolar, and referential montages were obtained, and digitally reformatted as necessary.  Duration of tracing: 22:24  Description: Pt is intubated, comatose, sedated (Fentanyl), and on Keppra/Tegretol.  Background consists of generalized slowing with 4 Hz posterior predominance.  There are intermittent bursts of generalized high amplitude spike and polyspike and wave discharges occurring at irregular frequency, maximal over the left occipital region which are interspersed by periods of relative voltage suppression lasting 1-4 seconds.  There was no video clinical correlate with the discharges.  No noted stimulation maneuvers.  Neither HV or photic stimulation were performed.  EKG was monitored and noted to be ST at 102 bpm.  Impression: This is an abnormal EEG due to a burst-suppression pattern seen throughout the tracing.  Burst suppression pattern can be seen in a variety of circumstances, including anesthesia, drug intoxication, hypothermia, as well as cerebral anoxia.  Clinical/neurological, and radiographic correlation advised.  A repeat EEG once off Fentanyl might provide additional prognostic information   Beryle Beamsobert Amerah Puleo, M.D. Neurology  Cell [phone]

## 2017-12-06 NOTE — H&P (Addendum)
NAME:  Amber Curry, MRN:  [id number], DOB:  11-05-1990, LOS: 3 ADMISSION DATE:  12/14/2017, CONSULTATION DATE:  12/06/17 REFERRING MD:  Hospitalist CHIEF COMPLAINT:  Cardiac arrest   Brief History   27 y.o female, s/p cardiac arrest on the floor, transferred to the ICU. Intubated during ACLS protocol. ROSC achieved. Significant Hospital Events   From the admitting h/p on 12/07/2017: This is a 27 year-old female with cerebral palsy complicated by intellectual disability, severe spastic quadriparesis, cortical blindness, dysphagia with GJT, and seizure disorder who presents for evaluation of fevers x1 day. She presents with her mother who provides the history. Patient was in her usual state of health until after returning home from routine GJ tube exchange yesterday when her mother noticed the patient was diaphoretic and not acting like her usual self. She hasn't noticed specific changes, but feels shes more altered and appears distressed.  Vital signs on arrival to the ED include temperature 102.6*, BP 93/66, pulse 126 bpm, respirations 30 and SpO2 88%. Code sepsis was initiated and she was started on IVF resuscitation and IV Vancomycin, Cefepime and Metronidazole. Lactic acid 5.6. CMET with K 3.2, bicarb 17, glucose 132, albumin 2.9, AST 43, ALT 49 and anion gap 16. CBC without leukocytosis.   Portable CXR with poor inspiratory effort. CT abdomen/pelvis showed consolidation vs atelectasis in right lung base, dilated and fluid-filled esophagus suggesting reflux or dysmotility and GJT appeared to be in appropriate position. Also noted was mild wall-thickening of sigmoid colon and while no obvious bowel perforation was appreciated, repeat study with water-soluble bowel contrast was suggested for further evaluation. This study apparently had suboptimal contrast administration however there was suspicion for localized contained perforation in the mid-abdomen. General surgery was consulted for evaluation.  Case was discussed with CCM who were consulted due to severe sepsis with persistent tachycardia recommended Medicine Admission and reconsultation if patient were to require ventilatory or vasopressor support. IMTS was contacted for admission.   Consults: date of consult/date signed off & final recs:   Procedures (surgical and bedside):  12/06/17: intubation, cpr Significant Diagnostic Tests:  11/18/2017, 19/19/19: CT abdomen  Micro Data: 12/02/2017: urine and blood cultures pending 12/14/2017: MRSA PCR negative Antimicrobials:  12/05/17: Zosyn  Subjective:  Unresponsive Objective   Blood pressure (!) 134/108, pulse (!) 159, temperature 99.2 F (37.3 C), temperature source Axillary, resp. rate (!) 25, weight 53.5 kg, SpO2 (!) 88 %.    Vent Mode: PRVC FiO2 (%):  [100 %] 100 % Set Rate:  [15 bmp] 15 bmp Vt Set:  [350 mL] 350 mL PEEP:  [5 cmH20-10 cmH20] 5 cmH20   Intake/Output Summary (Last 24 hours) at 12/06/2017 1123 Last data filed at 12/06/2017 0618 Gross per 24 hour  Intake 1097.21 ml  Output -  Net 1097.21 ml   Filed Weights   12/14/2017 0355 12/15/2017 1009  Weight: 47.6 kg 53.5 kg    Examination: General: No response to tactile stimulation HENT: ETT/OGT. Pupils sluggishly reactive to light. No icterus. Lungs: Bilateral breath sounds, rhonchi. No wheezes, crackles.  Cardiovascular: s1s2, tachycardic regular rhythm. Monitor reveals sinus tachycardia. No murmur, gallop or rub. Abdomen: G tube in the left upper quadrant. No muscle guarding. No elicited tenderness. No organomegaly. Extremities: warm to touch. No cyanosis or mottling. No pedal edema. Neuro: Does not respond to stimulation  Resolved Hospital Problem list    Assessment & Plan:  CP Arrest with Ventilator dependent respiratory failure -etiology unknown at this time - CTA for possible PE -  Continue sepsis therapy - Continue mechanical ventilation - Probable TTM based on mental status exam post ROSC. Caretaker of  15 years states she will stay awake at home for at least two hours out of bed in order to watch/listen to TV. Will discuss with the family. - PRVC with adjustments based on blood gas results. - TTM initially set at [facility]36C, but will need 33C based on burst suppression findings on eeg. Will consult neurology and obtain CT head today.  Cerebral Palsy, Spastic Quadriplegia, Dysphagia with PEG, Cognitive Impairment, Non-verbal, Cortical Blindness -continue current supportive care  Dysphagia, GJT-Dependent Hypokalemia -replete potassium as required -Surgery recommends hold enteral feedings until 12/07/17  Seizure Disorder - Continue AED's. G tube being used for medications, only enteral feedings have been held until tomorrow.    Disposition / Summary of Today's Plan 12/06/17   See above    Diet: currently npo Pain/Anxiety/Delirium protocol (if indicated): n/a VAP protocol (if indicated) yes DVT prophylaxis: sub q heparin vs scd's GI prophylaxis: Add H2 blocker if ventilated > 24 hours. Hyperglycemia protocol: not required at this time. Mobility:bed bound Code Status: full Family Communication: to discuss care plan with parents  Labs   CBC: Recent Labs  Lab 02/25/18 0315 12/04/17 0014 12/05/17 0500 12/06/17 0549  WBC 4.0 4.8 9.5 8.6  NEUTROABS 3.2  --   --   --   HGB 17.3* 15.0 12.6 12.5  HCT 53.3* 46.4* 39.6 41.2  MCV 91.3 93.2 94.5 97.9  PLT 302 245 122* 175   Basic Metabolic Panel: Recent Labs  Lab 02/25/18 0315 12/04/17 0014 12/05/17 0500 12/06/17 0549  NA 137 144 142 147*  K 3.2* 4.6 3.9 3.2*  CL 104 118* 117* 114*  CO2 17* 18* 19* 25  GLUCOSE 132* 137* 97 121*  BUN 8 10 6  5*  CREATININE 0.69 0.47 0.36* 0.61  CALCIUM 8.4* 7.3* 6.8* 7.6*   GFR: CrCl cannot be calculated (Unknown ideal weight.). Recent Labs  Lab 02/25/18 0315  12/04/17 0014 12/04/17 0746 12/05/17 0500 12/05/17 0730 12/06/17 0549 12/06/17 0719  WBC 4.0  --  4.8  --  9.5  --  8.6  --     LATICACIDVEN  --    < > 3.6* 2.3*  --  2.0*  --  1.7   < > = values in this interval not displayed.   Liver Function Tests: Recent Labs  Lab 02/25/18 0315 12/04/17 0014  AST 43* 25  ALT 49* 32  ALKPHOS 65 38  BILITOT 0.7 0.4  PROT 6.1* 4.5*  ALBUMIN 2.9* 2.0*   No results for input(s): LIPASE, AMYLASE in the last 168 hours. No results for input(s): AMMONIA in the last 168 hours. ABG No results found for: PHART, PCO2ART, PO2ART, HCO3, TCO2, ACIDBASEDEF, O2SAT  Coagulation Profile: No results for input(s): INR, PROTIME in the last 168 hours. Cardiac Enzymes: No results for input(s): CKTOTAL, CKMB, CKMBINDEX, TROPONINI in the last 168 hours. HbA1C: No results found for: HGBA1C CBG: Recent Labs  Lab 12/05/17 1645 12/05/17 2044 12/06/17 0041 12/06/17 0520 12/06/17 0749  GLUCAP 96 100* 99 108* 105*    Admitting History of Present Illness.   See above Review of Systems:   Unavailable Past medical history  She,  has a past medical history of CP (cerebral palsy) (HCC), Seizures (HCC), Sepsis (HCC) (11/2017), and Vision abnormalities.   Surgical History    Past Surgical History:  Procedure Laterality Date  . IR CM INJ ANY COLONIC TUBE W/FLUORO  02-15-18  .  IR GENERIC HISTORICAL  05/07/2016   IR CM INJ ANY COLONIC TUBE W/FLUORO 05/07/2016 Jolaine Click, MD [facility]  . IR GENERIC HISTORICAL  06/12/2016   IR REPLC DUODEN/JEJUNO TUBE PERCUT W/FLUORO 06/12/2016 Simonne Come, MD [facility]  . IR REPLC DUODEN/JEJUNO TUBE PERCUT W/FLUORO  03/12/2017  . IR REPLC DUODEN/JEJUNO TUBE PERCUT W/FLUORO  12/02/2017  . REMOVAL OF GASTROSTOMY TUBE       Social History   Social History   Socioeconomic History  . Marital status: Single    Spouse name: Not on file  . Number of children: Not on file  . Years of education: Not on file  . Highest education level: Not on file  Occupational History  . Not on file  Social Needs  . Financial resource strain: Not on file  . Food  insecurity:    Worry: Not on file    Inability: Not on file  . Transportation needs:    Medical: Not on file    Non-medical: Not on file  Tobacco Use  . Smoking status: Never Smoker  . Smokeless tobacco: Never Used  Substance and Sexual Activity  . Alcohol use: No  . Drug use: No  . Sexual activity: Never  Lifestyle  . Physical activity:    Days per week: Not on file    Minutes per session: Not on file  . Stress: Not on file  Relationships  . Social connections:    Talks on phone: Not on file    Gets together: Not on file    Attends religious service: Not on file    Active member of club or organization: Not on file    Attends meetings of clubs or organizations: Not on file    Relationship status: Not on file  . Intimate partner violence:    Fear of current or ex partner: Not on file    Emotionally abused: Not on file    Physically abused: Not on file    Forced sexual activity: Not on file  Other Topics Concern  . Not on file  Social History Narrative   Javeria stays at home and receives care from her mother and a home health aid.   ,  reports that she has never smoked. She has never used smokeless tobacco. She reports that she does not drink alcohol or use drugs.   Family history   Her family history is not on file.   Allergies Allergies  Allergen Reactions  . Erythromycin Diarrhea  . Azithromycin Diarrhea    Home meds  Prior to Admission medications   Medication Sig Start Date End Date Taking? Authorizing Provider  acetaminophen (TYLENOL) 160 MG/5ML suspension Take 7.5 mLs by mouth every 4 (four) hours as needed for fever. 06/28/16  Yes [provider]  albuterol (PROVENTIL) (2.5 MG/3ML) 0.083% nebulizer solution Take 2.5 mg by nebulization every 4 (four) hours as needed (for wheezing, shortness of breath, or chest congestion).  04/13/17  Yes [provider]  AMBULATORY NON FORMULARY MEDICATION Medication Name: Baclofen Suspension 10mg /ml  Take 2  mL by mouth every morning, 1.5 mL at midday, at 1 mL at bedtime Patient taking differently: Medication Name: Baclofen Suspension 10mg /ml: 2 ml's per tube in the morning then 1.5 ml's midday then 1 ml at bedtime 05/21/17  Yes Goodpasture, Inetta Fermo, NP  clotrimazole-betamethasone (LOTRISONE) cream Apply 1 application topically 2 (two) times daily as needed (for scalp irritation).  01/02/14  Yes [provider]  FLUOCINOLONE ACETONIDE SCALP 0.01 %  OIL Apply 1 application topically every 30 (thirty) days.  01/29/14  Yes [provider]  Fluocinolone Acetonide Scalp 0.01 % OIL Apply 1 application topically See admin instructions. Apply to scalp once a week 12/06/15  Yes [provider]  HYDROcodone-acetaminophen (HYCET) 7.5-325 mg/15 ml solution Take 15 mLs by mouth every 6 (six) hours as needed for moderate pain. Patient taking differently: Place 7.5 mLs into feeding tube every 6 (six) hours as needed for moderate pain.  08/24/15  Yes Lorre Nick, MD  ibuprofen (ADVIL,MOTRIN) 100 MG/5ML suspension Place 10 mLs into feeding tube every 6 (six) hours as needed for pain. 06/28/16  Yes [provider]  levETIRAcetam (KEPPRA) 100 MG/ML solution Give 5 ml by mouth in the morning and 7.4ml by mouth at night Patient taking differently: Place 500-750 mg into feeding tube See admin instructions. 500 mg per tube in the morning then 750 mg at bedtime 05/21/17  Yes Elveria Rising, NP  MedroxyPROGESTERone Acetate 150 MG/ML SUSY Inject 150 mg into the muscle every 3 (three) months. 04/02/16  Yes [provider]  omeprazole (PRILOSEC) 20 MG capsule 20 mg See admin instructions. 20 mg per tube three times a day    Yes [provider]  PHENObarbital 20 MG/5ML elixir TAKE 2 AND 1/2 TEASPOONFULS TWICE A DAY 06/19/17  Yes Goodpasture, Inetta Fermo, NP  TEGRETOL 100 MG/5ML suspension TAKE 3 TIMES A DAY Patient taking differently: Place 220-240 mg into feeding tube See admin instructions.  11 ml's (220 mg) two times a day per tube and 12 ml's (240 mg) at 7:30 PM in the evening 08/05/17  Yes Goodpasture, Inetta Fermo, NP  AMBULATORY NON FORMULARY MEDICATION Medication Name: Glycopyrrolate 1mg  - compound to 1mg /4ml. Give 0.57ml every 8 hours (for drooling) 05/21/17   Elveria Rising, NP     CRITICAL CARE Performed by: [name]   Total critical care time: 50 minutes  Critical care time was exclusive of separately billable procedures and treating other patients.  Critical care was necessary to treat or prevent imminent or life-threatening deterioration.  Critical care was time spent personally by me on the following activities: development of treatment plan with patient and/or surrogate as well as nursing, discussions with consultants, evaluation of patient's response to treatment, examination of patient, obtaining history from patient or surrogate, ordering and performing treatments and interventions, ordering and review of laboratory studies, ordering and review of radiographic studies, pulse oximetry and re-evaluation of patient's condition.

## 2017-12-06 NOTE — Code Documentation (Signed)
  Patient Name: Angela NevinConella S Romanski   MRN: [id number]   Date of Birth/ Sex: 06/22/1990 , female      Admission Date: 12/07/2017  Attending Provider: Elayne SnareMiller, Michael B, MD  Primary Diagnosis: Severe sepsis [facility](HCC)   Indication: Pt was in her usual state of health until this AM, when she was noted to be in Asystole. Code blue was subsequently called. At the time of arrival on scene, ACLS protocol was underway.   Technical Description:  - CPR performance duration:  6 minute  - Was defibrillation or cardioversion used? No   - Was external pacer placed? No  - Was patient intubated pre/post CPR? Yes   Medications Administered: Y = Yes; Blank = No Amiodarone    Atropine    Calcium    Epinephrine  Y  Lidocaine    Magnesium    Norepinephrine    Phenylephrine    Sodium bicarbonate    Vasopressin     Post CPR evaluation:  - Final Status - Was patient successfully resuscitated ? Yes - What is current rhythm? Sinus Tachy - What is current hemodynamic status? Stable BP 103/81, Pulse 140, 99 O2 sat intubated  Miscellaneous Information:  - Labs sent, including: CBC, CMP, ABG, Protime-INR  - Primary team notified?  Yes  - Family Notified? Yes  - Additional notes/ transfer status: Transferred to ICU     Theotis BarrioLee, Joshua K, MD  12/06/2017, 11:01 AM

## 2017-12-06 NOTE — Progress Notes (Signed)
CDS notified of patient's status. Referral # [id number]. Coordinator to call back to discuss further plans. Bedside RN notified of this.

## 2017-12-06 NOTE — Progress Notes (Signed)
Pt transported on vent to CT and returned to 2H06 without complications. 

## 2017-12-06 NOTE — Progress Notes (Signed)
Code called on [address]. Upon arrival the patients was being ventilated via bag mask & CPR was in progress. CRNA intubated with a 7.5@22  confirmed with ETC02 & breath sounds. Pt transferred to 2H06 & placed on ventilator. Chest X-ray ordered to confirm placement.

## 2017-12-06 NOTE — Progress Notes (Signed)
CRITICAL VALUE ALERT  Critical Value: Troponin 0.69  Date & Time Notied: 9/21 2049  Provider Notified: 2055, Dr. Arsenio LoaderSommer  Orders Received/Actions taken: No new orders at this time

## 2017-12-06 NOTE — Progress Notes (Signed)
   12/06/17 1300  Clinical Encounter Type  Visited With Patient and family together  Visit Type Code (code cool on pager)  Spiritual Encounters  Spiritual Needs Emotional  Responded to page for code cool. Met Pt. mother in hallway. She informed me that they were doing a cool down. Spoke with Nurse and she further explained the cool down process to me. Provided comfort care to mother in waiting room where her mother and brother was with her providing emotional support. Provided comfort and spiritual care.  Vivien Rota Lyndel Sarate-Chaplain [phone]

## 2017-12-06 NOTE — Progress Notes (Signed)
   Subjective:  Overnight: Remained tachycardic   Amber Curry was examined and evaluated at bedside this AM with her mother present. Patient is nonverbal at baseline. She remain in sinus tach, with normal O2 sat and shallow respirations at this time. Mother expresses concern about increased edema of patient's feet. Will investigate urine out put.  Responded to code for this patient later in the morning approx 10am. She was found to be in asystole. Code was in process upon my arrival. Patient rhythm had returned with CPR and Epinephrine. Patient intubated in room and transferred to ICU.   Objective:  Vital signs in last 24 hours: Vitals:   12/06/17 0050 12/06/17 0541 12/06/17 0750 12/06/17 1025  BP: 116/69 113/72 111/61 (!) 134/108  Pulse: (!) 137 (!) 142 (!) 140 (!) 159  Resp: 17 12 20  (!) 25  Temp: 99.3 F (37.4 C) 99.3 F (37.4 C) 99.2 F (37.3 C)   TempSrc:  Axillary Axillary   SpO2: 95% 93% 92% (!) 88%  Weight:       Initial EXAM, prior to code Const: lying in bed CV: Tachycardic, normal S1 and S2, no MGR Resp: Difficult to access due to body position but decreased breath sounds on the right lower lung with shallow breathing.  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   CPR: Asystole. Strips showed brady cardia leading up to asystole. Episode occurred approximately 10am. Upon my arrival  patient had returned to sinus rhythm s/p CPR and EPI. Investigation as to cause ongoing. > Intubated and transferred to ICU - Transfer to ICU - Patient to have CTA to evaluate for PE - STAT CMP, CBC, PT/INR, CXR, ABG  Severe sepsis  Right lower lobe pneumonia (aspiration vs CAP) Low grade fever with range 100.72F-100.6. Remains tachycardic with normal blood pressure. Continued to oxygenate well on supplemental O2 at 95% [facility]). No leukocytosis. Lactic acid down-trending 1.7<< 2<<2.3<<3.6 - Continue Zosyn q6 starting tonight  - Blood culture NGTD - f/u am CBC  - f/u Lactic acid   Bowel perforation:  Repeat CT abdomen with contrast revealed gas surrounding the gastric antrum and duodenal bulb but no evidence of leak of the oral contrast in the peritoneal space suggesting previous bowel perforation that has now closed. General surgery recommends to hold tube feeds for 48 hours. - Holding tube feeds - Appreciate general surgery reccs   Cerebral palsy: Continue Baclofen and hydrocodone q6 scheduled   Seizure disorder: Continue Phenobarbital, Keppra and Carbamazepine  Dispo: Anticipated discharge in approximately 5-7 day(s).   Amber Curry, Alexander, MD 12/06/2017, 10:35 AM Pager: [phone] [facility] PGY-2

## 2017-12-06 NOTE — Progress Notes (Signed)
Cooling pads placed and 36 degree cooling protocol initiated at 1445. Patient was already 36 degrees at this time. At 1700, advanced to 33 degree cooling protocol per Dr. Hyacinth MeekerMiller. Will continue to monitor frequently.

## 2017-12-06 NOTE — Consult Note (Addendum)
NEURO HOSPITALIST CONSULT NOTE   Requestig physician: Dr. Hyacinth Meeker   Reason for Consult:post cardiac arrest    History obtained from:  Chart  HPI:                                                                                                                                          Amber Curry is an 27 y.o. female  With PMH Cerebral palsy with intellectual disability and seizure disorder, severe spastic quadriparesis, dysphagia with GJT, cortical blindness who presented for evaluation of fevers.  Per chart and mom: patient was in her usual state of health until after returning home from a routine GJ tube exchange. Mother noticed that patient was diaphoretic and not acting like herself. She was more altered and appeared distressed.  Hospital course: Admitted on 12-21-2017 for fevers; code sepsis was initiated and patient given IV fluid and started on Vancomycin, Cefepime, and flagyl. Temp was 102.6. Portable CXR with poor inspiratory effort. CT abdomen/pelvis showed consolidation vs atelectasis in right lung base, dilated and fluid-filled esophagus suggesting reflux or dysmotility and GJT appeared to be in appropriate position.  12/06/17 cardiac arrest on the floor,intubated during ACLS transferred to ICU. ROSC achieved. CT abdomen/pelvis and CT angi chest: no evidence of PE, moderate dependent B pleural effusions associated with bibasilar atelectasis. Ascites present. Small bowel distention with improving ileus. Routine EEG performed final read pending.   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 Amber Curry has residuals of severe spastic quadriparesis, mixed seizure types, severe dysphagia, cortical blindness and profound intellectual  disability Per patient home regimen on Keppra 500 mg every morning and 750 mg at bedtime , phenobarbital 50mg  BID and tegretol 220 mg BID and 240 mg at bedtime  for seizures  Past Medical History:  Diagnosis Date  . CP (cerebral palsy) (HCC)   . Seizures (HCC)   . Sepsis (HCC) 11/2017  . Vision abnormalities     Past Surgical History:  Procedure Laterality Date  . IR CM INJ ANY COLONIC TUBE W/FLUORO  12/21/17  . IR GENERIC HISTORICAL  05/07/2016   IR CM INJ ANY COLONIC TUBE W/FLUORO 05/07/2016 Jolaine Click, MD [facility]  . IR GENERIC HISTORICAL  06/12/2016   IR REPLC DUODEN/JEJUNO TUBE PERCUT W/FLUORO 06/12/2016 Simonne Come, MD [facility]  . IR REPLC DUODEN/JEJUNO TUBE PERCUT W/FLUORO  03/12/2017  . IR REPLC DUODEN/JEJUNO TUBE PERCUT W/FLUORO  12/02/2017  . REMOVAL OF GASTROSTOMY TUBE      History reviewed. No pertinent family history.            Social History:  reports  that she has never smoked. She has never used smokeless tobacco. She reports that she does not drink alcohol or use drugs.  Allergies  Allergen Reactions  . Erythromycin Diarrhea  . Azithromycin Diarrhea    MEDICATIONS:                                                                                                                     Scheduled: . baclofen  10 mg Per Tube QHS  . baclofen  15 mg Per Tube Q1400  . baclofen  20 mg Per Tube Daily  . carBAMazepine  220 mg Per Tube BID   And  . carBAMazepine  240 mg Per Tube Daily  . chlorhexidine      . fentaNYL (SUBLIMAZE) injection  50 mcg Intravenous Once  . heparin  5,000 Units Subcutaneous Q8H  . iohexol  75 mL Intravenous Once  . levETIRAcetam  500 mg Per Tube Daily  . levETIRAcetam  750 mg Per Tube QHS  . pantoprazole sodium  40 mg Per Tube Daily  . PHENObarbital  50 mg Per Tube BID  . sodium chloride flush  10-40 mL Intracatheter Q12H   Continuous: . sodium chloride 60 mL/hr at 12/06/17 1500  . fentaNYL infusion INTRAVENOUS 25 mcg/hr (12/06/17  1502)  . lactated ringers 50 mL/hr at 12/06/17 0618  . piperacillin-tazobactam 3.375 g (12/06/17 0552)   ZOX:[id number]:acetaminophen **OR** acetaminophen, albuterol, fentaNYL, midazolam, midazolam, ondansetron **OR** ondansetron (ZOFRAN) IV, sodium chloride flush   ROS:                                                                                                                                        unobtainable from patient due to mental status   Blood pressure 102/80, pulse (!) 105, temperature 99 F (37.2 C), temperature source Axillary, resp. rate 11, weight 53.5 kg, SpO2 99 %.   General Examination:  Physical Exam  HEENT-  Normocephalic, no lesions, without obvious abnormality.  Edematous external eyes bilaterally. Corneal edema present bilaterally.  Cardiovascular- S1-S2 audible, pulses palpable throughout   Lungs-intubated   Saturations within normal limits Abdomen- All 4 quadrants palpated and nontender Extremities- Warm, dry and intact Musculoskeletal-all 4 extremities, contracted, edematous and atrophy noted.  Skin-warm and dry,   Neurological Examination cooling protocol initiated 35 celsius during exam/ and fentanyl drip Mental Status: Patient does not respond to verbal stimuli.  Patient opens eyes to deep sternal rub.  Does not follow commands.  No verbalizations noted.  Cranial Nerves: II: patient does not respond confrontation bilaterally, blind at baseline III,IV,VI:pupils right 2 mm, left 2 mm,and sluggish but reactive bilaterally, doll's response absent bilaterally.  V,VII: corneal reflex unable to determine; eyes twitching at baseline VIII: patient does not respond to verbal stimuli IX,X: gag reflex present, cough present  XI: trapezius strength unable to test bilaterally XII: tongue strength unable to test Motor/Sensory: There is flexion in all 4 extremities to  noxious stimuli.  Unclear she is decorticate at baseline. Deep Tendon Reflexes:  Not tested Plantars: absent bilaterally Cerebellar: Unable to perform    Lab Results: Basic Metabolic Panel: Recent Labs  Lab 12/04/2017 0315 12/04/17 0014 12/05/17 0500 12/06/17 0549 12/06/17 1112  NA 137 144 142 147* 146*  K 3.2* 4.6 3.9 3.2* 3.0*  CL 104 118* 117* 114* 115*  CO2 17* 18* 19* 25 21*  GLUCOSE 132* 137* 97 121* 112*  BUN 8 10 6  5* 7  CREATININE 0.69 0.47 0.36* 0.61 0.83  CALCIUM 8.4* 7.3* 6.8* 7.6* 7.5*    CBC: Recent Labs  Lab 11/21/2017 0315 12/04/17 0014 12/05/17 0500 12/06/17 0549 12/06/17 1112  WBC 4.0 4.8 9.5 8.6 10.4  NEUTROABS 3.2  --   --   --   --   HGB 17.3* 15.0 12.6 12.5 12.3  HCT 53.3* 46.4* 39.6 41.2 39.6  MCV 91.3 93.2 94.5 97.9 96.8  PLT 302 245 122* 175 133*   Imaging: Dg Chest 1 View  Result Date: 12/06/2017 CLINICAL DATA:  Difficult intubation EXAM: CHEST  1 VIEW COMPARISON:  12/06/2017 FINDINGS: Endotracheal tube 2.2 cm above the carina. NG tube extends to the stomach. Right subclavian central line tip at the mid SVC level. Mediastinal drains noted. Defibrillator pads overlie the chest. Contrast within the bowel in the epigastric region. Low lung volumes persist. There is improved aeration of the left lung following retraction of the endotracheal tube. Scattered airspace disease and atelectasis remains. Left effusion not excluded. No pneumothorax. IMPRESSION: Retraction of endotracheal tube, now 2.2 cm above the carina. Improved aeration of the left lung Persistent low lung volumes with scattered airspace disease and atelectasis Suspect left pleural effusion layering posteriorly No large pneumothorax Electronically Signed   By: Judie Petit.  Shick M.D.   On: 12/06/2017 10:56   Ct Angio Chest Pe W Or Wo Contrast  Result Date: 12/06/2017 CLINICAL DATA:  Status post cardiac arrest. History of seizures and cerebral palsy. EXAM: CT ANGIOGRAPHY CHEST CT ABDOMEN AND  PELVIS WITH CONTRAST TECHNIQUE: Multidetector CT imaging of the chest was performed using the standard protocol during bolus administration of intravenous contrast. Multiplanar CT image reconstructions and MIPs were obtained to evaluate the vascular anatomy. Multidetector CT imaging of the abdomen and pelvis was performed using the standard protocol during bolus administration of intravenous contrast. CONTRAST:  80mL ISOVUE-370 IOPAMIDOL (ISOVUE-370) INJECTION 76% COMPARISON:  Abdominopelvic CT 12/04/2017 and 12/05/2017. Chest radiographs today. FINDINGS: CTA CHEST  FINDINGS Cardiovascular: The pulmonary arteries are well opacified with contrast to the level of the subsegmental branches. There is no evidence of acute pulmonary embolism. The thoracic aorta and great vessels appear normal. Right arm PICC extends to the mid SVC. The heart size is normal. There is no pericardial effusion. Mediastinum/Nodes: There are no enlarged mediastinal, hilar or axillary lymph nodes. There is no evidence of mediastinal hematoma. Endotracheal and enteric tubes are in place. Lungs/Pleura: There are moderate size dependent pleural effusions bilaterally there is no pneumothorax. Dependent airspace opacities at both lung bases likely represent atelectasis. There are additional patchy airspace opacities in both upper lobes, likely pneumonia or hemorrhage. No lung mass or endobronchial lesion identified. Musculoskeletal/Chest wall: Generalized edema throughout the chest wall. No soft tissue mass or acute osseous findings are demonstrated. Review of the MIP images confirms the above findings. CT ABDOMEN AND PELVIS FINDINGS Hepatobiliary: The liver has a stable appearance without intrinsic abnormality. No evidence of gallstones, gallbladder wall thickening or biliary dilatation. Pancreas: Unremarkable. No pancreatic ductal dilatation or surrounding inflammatory changes. Spleen: Normal in size without focal abnormality. Adrenals/Urinary  Tract: Both adrenal glands appear normal. No focal renal abnormalities are identified. There is no evidence of urinary tract calculus or hydronephrosis. The bladder appears normal. Stomach/Bowel: Enteric tube extends into the proximal stomach. There is a percutaneous gastrojejunostomy which extends into the proximal jejunum. The stomach remains decompressed. There is no significant residual small or large bowel distension. There is mild diffuse small bowel wall thickening. Multiple extraluminal air bubbles are again noted within the upper abdomen, surrounding the stomach and gastrohepatic ligament. These have slightly decreased in overall volume over the last 2 days. There is no extravasated enteric contrast. Vascular/Lymphatic: There are no enlarged abdominal or pelvic lymph nodes. No significant vascular findings. Reproductive: The uterus and ovaries appear normal. No adnexal mass. Other: Again demonstrated is moderate ascites. There is increased fluid in the right upper quadrant, inferior to the liver and gallbladder, measuring up to 11.0 x 4.0 cm on image 20/5. Other interloop and pelvic components of this ascites have not significantly changed. There are no well-defined or peripherally enhancing fluid collections. Ventricular peritoneal shunt tubing extends into the right pelvis. Musculoskeletal: No acute osseous findings. Thoracolumbar scoliosis, postsurgical changes in the left bony pelvis and proximal femurs again noted. There is generalized edema throughout the subcutaneous fat. Review of the MIP images confirms the above findings. IMPRESSION: 1. No evidence of acute pulmonary embolism. 2. Support system appears adequately positioned within the chest. 3. Moderate dependent bilateral pleural effusions with associated bibasilar atelectasis. Additional patchy airspace opacities in both upper lobes, suspicious for pneumonia or pulmonary hemorrhage. 4. Extraluminal gas in the upper abdomen, surrounding the  stomach and gastrohepatic ligament has mildly improved. No extravasated enteric contrast is identified. 5. The volume of ascites has mildly increased, especially inferior to the liver. No well-defined or peripherally enhancing intra-abdominal fluid collections are seen to suggest abscess. 6. Resolved small bowel distension consistent with improving ileus. Electronically Signed   By: Carey Bullocks M.D.   On: 12/06/2017 14:21   Ct Abdomen Pelvis W Contrast  Result Date: 12/06/2017 CLINICAL DATA:  Status post cardiac arrest. History of seizures and cerebral palsy. EXAM: CT ANGIOGRAPHY CHEST CT ABDOMEN AND PELVIS WITH CONTRAST TECHNIQUE: Multidetector CT imaging of the chest was performed using the standard protocol during bolus administration of intravenous contrast. Multiplanar CT image reconstructions and MIPs were obtained to evaluate the vascular anatomy. Multidetector CT imaging of the abdomen and  pelvis was performed using the standard protocol during bolus administration of intravenous contrast. CONTRAST:  80mL ISOVUE-370 IOPAMIDOL (ISOVUE-370) INJECTION 76% COMPARISON:  Abdominopelvic CT 12/04/2017 and 12-14-17. Chest radiographs today. FINDINGS: CTA CHEST FINDINGS Cardiovascular: The pulmonary arteries are well opacified with contrast to the level of the subsegmental branches. There is no evidence of acute pulmonary embolism. The thoracic aorta and great vessels appear normal. Right arm PICC extends to the mid SVC. The heart size is normal. There is no pericardial effusion. Mediastinum/Nodes: There are no enlarged mediastinal, hilar or axillary lymph nodes. There is no evidence of mediastinal hematoma. Endotracheal and enteric tubes are in place. Lungs/Pleura: There are moderate size dependent pleural effusions bilaterally there is no pneumothorax. Dependent airspace opacities at both lung bases likely represent atelectasis. There are additional patchy airspace opacities in both upper lobes, likely  pneumonia or hemorrhage. No lung mass or endobronchial lesion identified. Musculoskeletal/Chest wall: Generalized edema throughout the chest wall. No soft tissue mass or acute osseous findings are demonstrated. Review of the MIP images confirms the above findings. CT ABDOMEN AND PELVIS FINDINGS Hepatobiliary: The liver has a stable appearance without intrinsic abnormality. No evidence of gallstones, gallbladder wall thickening or biliary dilatation. Pancreas: Unremarkable. No pancreatic ductal dilatation or surrounding inflammatory changes. Spleen: Normal in size without focal abnormality. Adrenals/Urinary Tract: Both adrenal glands appear normal. No focal renal abnormalities are identified. There is no evidence of urinary tract calculus or hydronephrosis. The bladder appears normal. Stomach/Bowel: Enteric tube extends into the proximal stomach. There is a percutaneous gastrojejunostomy which extends into the proximal jejunum. The stomach remains decompressed. There is no significant residual small or large bowel distension. There is mild diffuse small bowel wall thickening. Multiple extraluminal air bubbles are again noted within the upper abdomen, surrounding the stomach and gastrohepatic ligament. These have slightly decreased in overall volume over the last 2 days. There is no extravasated enteric contrast. Vascular/Lymphatic: There are no enlarged abdominal or pelvic lymph nodes. No significant vascular findings. Reproductive: The uterus and ovaries appear normal. No adnexal mass. Other: Again demonstrated is moderate ascites. There is increased fluid in the right upper quadrant, inferior to the liver and gallbladder, measuring up to 11.0 x 4.0 cm on image 20/5. Other interloop and pelvic components of this ascites have not significantly changed. There are no well-defined or peripherally enhancing fluid collections. Ventricular peritoneal shunt tubing extends into the right pelvis. Musculoskeletal: No acute  osseous findings. Thoracolumbar scoliosis, postsurgical changes in the left bony pelvis and proximal femurs again noted. There is generalized edema throughout the subcutaneous fat. Review of the MIP images confirms the above findings. IMPRESSION: 1. No evidence of acute pulmonary embolism. 2. Support system appears adequately positioned within the chest. 3. Moderate dependent bilateral pleural effusions with associated bibasilar atelectasis. Additional patchy airspace opacities in both upper lobes, suspicious for pneumonia or pulmonary hemorrhage. 4. Extraluminal gas in the upper abdomen, surrounding the stomach and gastrohepatic ligament has mildly improved. No extravasated enteric contrast is identified. 5. The volume of ascites has mildly increased, especially inferior to the liver. No well-defined or peripherally enhancing intra-abdominal fluid collections are seen to suggest abscess. 6. Resolved small bowel distension consistent with improving ileus. Electronically Signed   By: Carey Bullocks M.D.   On: 12/06/2017 14:21   Dg Chest Port 1 View  Result Date: 12/06/2017 CLINICAL DATA:  Endotracheal tube placement. EXAM: PORTABLE CHEST 1 VIEW COMPARISON:  Radiographs Dec 14, 2017 and 04/24/2017. Abdominal CT 12/04/2017. FINDINGS: 1025 hours. Endotracheal tube extends into the  right mainstem bronchus and should be retracted approximately 4 cm. Enteric tube projects below the diaphragm, tip not visualized. Ventricular peritoneal shunt catheter overlies the chest and upper abdomen. There is a right subclavian central venous catheter projecting to the level of the lower SVC. There is complete collapse of the left lung. There is mild perihilar atelectasis on the right. No significant pleural effusion or pneumothorax. IMPRESSION: 1. Right mainstem bronchus intubation with resulting complete left lung collapse. The endotracheal tube should be withdrawn approximately 4 cm. 2. Central line and enteric tube appear  adequately positioned. 3. These results were called by telephone at the time of interpretation on 12/06/2017 at 10:55 am to the patient's nurse Junious Dresser, who verbally acknowledged these results. Transmission of results delayed by transfer of patient to a different ward and 3 telephone calls required before phone was answered. Electronically Signed   By: Carey Bullocks M.D.   On: 12/06/2017 10:58    Routine EEG 12/06/17: This is an abnormal EEG due toa burst-suppression patternseen throughout the tracing.Burst suppression pattern can be seen in a variety of circumstances, including anesthesia, drug intoxication, hypothermia, as well as cerebral anoxia. Clinical/neurological, and radiographic correlation advised. A repeat EEG once off Fentanyl might provide additional prognostic information  Assessment: With PMH Cerebral palsy with intellectual disability and seizure disorder, severe spastic quadriparesis, dysphagia with GJT, cortical blindness who presented for evaluation of fevers. Neurology consulted after cardiac arrest. EEG showed a burst suppression patterm. Repeat EEG needed off sedation and once re-warmed. Patient currently intubated sedated with fentanyl and in the process of being cooled.    Recommendations: --Continue Levetiracetam --continue Phenobarbital -- continue tegretol - will re-assess after re-warmed and off all sedation  - Repeat EEG tomorrow  Valentina Lucks, MSN, NP-C [facility] [phone]  NEUROHOSPITALIST ADDENDUM Performed a face to face diagnostic evaluation.   I have reviewed the contents of history and physical exam as documented by PA/ARNP/Resident and agree with above documentation.  I have discussed and formulated the above plan as documented. Edits to the note have been made as needed.  Patient is a 27 year old female with cerebral palsy, quadriparetic with severe intellectual  develpement delay at baseline. Patient on the floor went into  asystole.  Unclear how long, ROSC was achieved shortly after.  EEG concerning for burst suppression.  Patient was cooled.    Georgiana Spinner Eusebio Blazejewski MD [facility] [phone]   If 7pm to 7am, please call on call as listed on AMION.

## 2017-12-06 NOTE — Progress Notes (Signed)
   12/06/17 1100  Clinical Encounter Type  Visited With Patient and family together  Visit Type Code  Spiritual Encounters  Spiritual Needs Prayer  Responded to code blue east room 6. When I arrived the Pt. Caregiver was in hallway in frantic mode. I consoled the caregiver and she informed me that the Pt. Mother just left and is on her way back. I met mother in main lobby at [place] end of hospital. Mother was exhausted and did not want to see daughter in critical condition. Patient was revived from code blue status and was transported to 2 Heart room 6. Informed nurse that Mother was in [place] and if Spiritual care services were needed just call back. Chaplain Vivien Rota Davan Nawabi--[phone]

## 2017-12-06 NOTE — Progress Notes (Signed)
eLink Physician-Brief Progress Note Patient Name: Amber Curry DOB: 12/15/1990 MRN: [id number]   Date of Service  12/06/2017  HPI/Events of Note  Hypokalemia -  K+ = 3.2 and Creatinine = 0.74.   eICU Interventions  Will replace K+.      Intervention Category Major Interventions: Electrolyte abnormality - evaluation and management  Sommer,Steven Eugene 12/06/2017, 8:12 PM

## 2017-12-07 ENCOUNTER — Other Ambulatory Visit (HOSPITAL_COMMUNITY): Payer: Medicare Other

## 2017-12-07 ENCOUNTER — Inpatient Hospital Stay (HOSPITAL_COMMUNITY): Payer: Medicare Other

## 2017-12-07 DIAGNOSIS — I469 Cardiac arrest, cause unspecified: Secondary | ICD-10-CM

## 2017-12-07 LAB — BASIC METABOLIC PANEL
ANION GAP: 4 — AB (ref 5–15)
Anion gap: 7 (ref 5–15)
Anion gap: 9 (ref 5–15)
Anion gap: 7 (ref 5–15)
Anion gap: 7 (ref 5–15)
BUN: 6 mg/dL (ref 6–20)
BUN: 5 mg/dL — AB (ref 6–20)
BUN: 5 mg/dL — ABNORMAL LOW (ref 6–20)
BUN: <5 mg/dL — ABNORMAL LOW (ref 6–20)
BUN: 5 mg/dL — ABNORMAL LOW (ref 6–20)
CALCIUM: 7.4 mg/dL — AB (ref 8.9–10.3)
CALCIUM: 7.6 mg/dL — AB (ref 8.9–10.3)
CALCIUM: 7.5 mg/dL — AB (ref 8.9–10.3)
CALCIUM: 7.3 mg/dL — AB (ref 8.9–10.3)
CALCIUM: 7.3 mg/dL — AB (ref 8.9–10.3)
CHLORIDE: 120 mmol/L — AB (ref 98–111)
CO2: 22 mmol/L (ref 22–32)
CO2: 19 mmol/L — ABNORMAL LOW (ref 22–32)
CO2: 20 mmol/L — AB (ref 22–32)
CO2: 22 mmol/L (ref 22–32)
CO2: 20 mmol/L — AB (ref 22–32)
Chloride: 116 mmol/L — ABNORMAL HIGH (ref 98–111)
Chloride: 120 mmol/L — ABNORMAL HIGH (ref 98–111)
Chloride: 118 mmol/L — ABNORMAL HIGH (ref 98–111)
Chloride: 117 mmol/L — ABNORMAL HIGH (ref 98–111)
Creatinine, Ser: 0.78 mg/dL (ref 0.44–1.00)
Creatinine, Ser: 0.72 mg/dL (ref 0.44–1.00)
Creatinine, Ser: 0.73 mg/dL (ref 0.44–1.00)
Creatinine, Ser: 0.78 mg/dL (ref 0.44–1.00)
Creatinine, Ser: 0.69 mg/dL (ref 0.44–1.00)
GFR calc Af Amer: >60 mL/min (ref >60)
GFR calc Af Amer: >60 mL/min (ref >60)
GFR calc Af Amer: >60 mL/min (ref >60)
GFR calc non Af Amer: >60 mL/min (ref >60)
GFR calc non Af Amer: >60 mL/min (ref >60)
GLUCOSE: 141 mg/dL — AB (ref 70–99)
GLUCOSE: 151 mg/dL — AB (ref 70–99)
Glucose, Bld: 154 mg/dL — ABNORMAL HIGH (ref 70–99)
Glucose, Bld: 125 mg/dL — ABNORMAL HIGH (ref 70–99)
Glucose, Bld: 125 mg/dL — ABNORMAL HIGH (ref 70–99)
Potassium: 3.3 mmol/L — ABNORMAL LOW (ref 3.5–5.1)
Potassium: 3.4 mmol/L — ABNORMAL LOW (ref 3.5–5.1)
Potassium: 3.9 mmol/L (ref 3.5–5.1)
Potassium: 3.8 mmol/L (ref 3.5–5.1)
Potassium: 3.5 mmol/L (ref 3.5–5.1)
SODIUM: 141 mmol/L (ref 135–145)
Sodium: 145 mmol/L (ref 135–145)
Sodium: 148 mmol/L — ABNORMAL HIGH (ref 135–145)
Sodium: 147 mmol/L — ABNORMAL HIGH (ref 135–145)
Sodium: 147 mmol/L — ABNORMAL HIGH (ref 135–145)

## 2017-12-07 LAB — POCT I-STAT 3, ART BLOOD GAS (G3+)
Acid-base deficit: 4 mmol/L — ABNORMAL HIGH (ref 0.0–2.0)
BICARBONATE: 18.7 mmol/L — AB (ref 20.0–28.0)
O2 SAT: 100 %
PCO2 ART: 21 mmHg — AB (ref 32.0–48.0)
PH ART: 7.543 — AB (ref 7.350–7.450)
Patient temperature: 33.1
TCO2: 19 mmol/L — ABNORMAL LOW (ref 22–32)
pO2, Arterial: 130 mmHg — ABNORMAL HIGH (ref 83.0–108.0)

## 2017-12-07 LAB — POCT I-STAT, CHEM 8
BUN: 5 mg/dL — ABNORMAL LOW (ref 6–20)
BUN: 5 mg/dL — AB (ref 6–20)
CALCIUM ION: 1.14 mmol/L — AB (ref 1.15–1.40)
CHLORIDE: 113 mmol/L — AB (ref 98–111)
Calcium, Ion: 1.07 mmol/L — ABNORMAL LOW (ref 1.15–1.40)
Chloride: 113 mmol/L — ABNORMAL HIGH (ref 98–111)
Creatinine, Ser: 0.6 mg/dL (ref 0.44–1.00)
Creatinine, Ser: 0.6 mg/dL (ref 0.44–1.00)
GLUCOSE: 142 mg/dL — AB (ref 70–99)
Glucose, Bld: 141 mg/dL — ABNORMAL HIGH (ref 70–99)
HCT: 35 % — ABNORMAL LOW (ref 36.0–46.0)
HEMATOCRIT: 34 % — AB (ref 36.0–46.0)
HEMOGLOBIN: 11.6 g/dL — AB (ref 12.0–15.0)
Hemoglobin: 11.9 g/dL — ABNORMAL LOW (ref 12.0–15.0)
Potassium: 3.1 mmol/L — ABNORMAL LOW (ref 3.5–5.1)
Potassium: 3.4 mmol/L — ABNORMAL LOW (ref 3.5–5.1)
SODIUM: 146 mmol/L — AB (ref 135–145)
Sodium: 146 mmol/L — ABNORMAL HIGH (ref 135–145)
TCO2: 21 mmol/L — AB (ref 22–32)
TCO2: 21 mmol/L — AB (ref 22–32)

## 2017-12-07 LAB — CBC
HCT: 36.1 % (ref 36.0–46.0)
HEMOGLOBIN: 11.8 g/dL — AB (ref 12.0–15.0)
MCH: 29.6 pg (ref 26.0–34.0)
MCHC: 32.7 g/dL (ref 30.0–36.0)
MCV: 90.7 fL (ref 78.0–100.0)
Platelets: 96 10*3/uL — ABNORMAL LOW (ref 150–400)
RBC: 3.98 MIL/uL (ref 3.87–5.11)
RDW: 13.5 % (ref 11.5–15.5)
WBC: 7.7 10*3/uL (ref 4.0–10.5)

## 2017-12-07 LAB — GLUCOSE, CAPILLARY
GLUCOSE-CAPILLARY: 112 mg/dL — AB (ref 70–99)
GLUCOSE-CAPILLARY: 135 mg/dL — AB (ref 70–99)
Glucose-Capillary: 134 mg/dL — ABNORMAL HIGH (ref 70–99)
Glucose-Capillary: 123 mg/dL — ABNORMAL HIGH (ref 70–99)
Glucose-Capillary: 120 mg/dL — ABNORMAL HIGH (ref 70–99)
Glucose-Capillary: 106 mg/dL — ABNORMAL HIGH (ref 70–99)
Glucose-Capillary: 116 mg/dL — ABNORMAL HIGH (ref 70–99)
Glucose-Capillary: 114 mg/dL — ABNORMAL HIGH (ref 70–99)

## 2017-12-07 LAB — POCT I-STAT 3, VENOUS BLOOD GAS (G3P V)
ACID-BASE DEFICIT: 4 mmol/L — AB (ref 0.0–2.0)
BICARBONATE: 18.4 mmol/L — AB (ref 20.0–28.0)
O2 Saturation: 59 %
Patient temperature: 33
TCO2: 19 mmol/L — ABNORMAL LOW (ref 22–32)
pCO2, Ven: 21.9 mmHg — ABNORMAL LOW (ref 44.0–60.0)
pH, Ven: 7.517 — ABNORMAL HIGH (ref 7.250–7.430)
pO2, Ven: 21 mmHg — CL (ref 32.0–45.0)

## 2017-12-07 LAB — TROPONIN I
Troponin I: 0.29 ng/mL (ref <0.03)
Troponin I: 0.22 ng/mL (ref <0.03)
Troponin I: 0.17 ng/mL (ref <0.03)

## 2017-12-07 LAB — PROTIME-INR
INR: 2.71
Prothrombin Time: 28.6 seconds — ABNORMAL HIGH (ref 11.4–15.2)

## 2017-12-07 LAB — APTT: aPTT: 58 seconds — ABNORMAL HIGH (ref 24–36)

## 2017-12-07 MED ORDER — POTASSIUM CHLORIDE 10 MEQ/50ML IV SOLN
10.0000 meq | INTRAVENOUS | Status: AC
  Administered 2017-12-07 (×3): 10 meq via INTRAVENOUS
  Filled 2017-12-07 (×3): qty 50

## 2017-12-07 MED ORDER — CHLORHEXIDINE GLUCONATE CLOTH 2 % EX PADS
6.0000 | MEDICATED_PAD | Freq: Every day | CUTANEOUS | Status: DC
  Administered 2017-12-07 – 2017-12-14 (×9): 6 via TOPICAL

## 2017-12-07 MED ORDER — POTASSIUM CHLORIDE 10 MEQ/50ML IV SOLN: 10.0000 meq | INTRAVENOUS | Status: DC | PRN

## 2017-12-07 MED ORDER — POTASSIUM CHLORIDE 10 MEQ/50ML IV SOLN
10.0000 meq | INTRAVENOUS | Status: DC | PRN
  Administered 2017-12-07: 10 meq via INTRAVENOUS
  Filled 2017-12-07: qty 50

## 2017-12-07 MED ORDER — POTASSIUM CHLORIDE 10 MEQ/50ML IV SOLN
10.0000 meq | INTRAVENOUS | Status: AC
  Administered 2017-12-07 (×4): 10 meq via INTRAVENOUS
  Filled 2017-12-07 (×4): qty 50

## 2017-12-07 NOTE — Progress Notes (Signed)
NAME:  Amber Curry, MRN:  [id number], DOB:  05/21/1990, LOS: 4 ADMISSION DATE:  09/24/2017, CONSULTATION DATE:  12/06/17 REFERRING MD:  Hospitalist CHIEF COMPLAINT:  Cardiac arrest   Brief History   27 y.o female, s/p cardiac arrest on the floor, transferred to the ICU. Intubated during ACLS protocol. ROSC achieved. 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 She presents with her mother who provides the history. Patient was in her usual state of health until after returning home from routine GJ tube exchange yesterday when her mother noticed the patient was diaphoretic and not acting like her usual self. She hasn't noticed specific changes, but feels shes more altered and appears distressed.  Vital signs on arrival to the ED include temperature 102.6*, BP 93/66, pulse 126 bpm, respirations 30 and SpO2 88%. Code sepsis was initiated and she was started on IVF resuscitation and IV Vancomycin, Cefepime and Metronidazole. Lactic acid 5.6. CMET with K 3.2, bicarb 17, glucose 132, albumin 2.9, AST 43, ALT 49 and anion gap 16. CBC without leukocytosis.   Portable CXR with poor inspiratory effort. CT abdomen/pelvis showed consolidation vs atelectasis in right lung base, dilated and fluid-filled esophagus suggesting reflux or dysmotility and GJT appeared to be in appropriate position. Also noted was mild wall-thickening of sigmoid colon and while no obvious bowel perforation was appreciated, repeat study with water-soluble bowel contrast was suggested for further evaluation. This study apparently had suboptimal contrast administration however there was suspicion for localized contained perforation in the mid-abdomen. General surgery was consulted for evaluation.  Case was discussed with CCM who were consulted due to severe sepsis with persistent tachycardia recommended Medicine Admission and reconsultation if patient were to require ventilatory or vasopressor support. IMTS was contacted for admission.   Consults: date of consult/date signed off & final recs:   Procedures (surgical and bedside):  12/06/17: intubation, cpr Significant Diagnostic Tests:  011-01-19, 19/19/19: CT abdomen 12/07/07- eeg, CT head 12/06/17- CTA, CT abdomen  Micro Data: 011-01-19: urine and blood cultures pending 011-01-19: MRSA PCR negative Antimicrobials:  12/05/17: Zosyn  Subjective:  Unresponsive, sedated, intubated, paralyzed. Placed on TTM protocol on 9/21 Objective   Blood pressure 108/88, pulse 76, temperature (!) 91.6 F (33.1 C), temperature source Bladder, resp. rate (!) 0, weight 60.4 kg, SpO2 100 %. CVP:  [10 mmHg-15 mmHg] 10 mmHg  Vent Mode: PRVC FiO2 (%):  [40 %-100 %] 40 % Set Rate:  [15 bmp-18 bmp] 18 bmp Vt Set:  [350 mL] 350 mL PEEP:  [5 cmH20-10 cmH20] 5 cmH20 Plateau Pressure:  [20 cmH20-25 cmH20] 21 cmH20   Intake/Output Summary (Last 24 hours) at 12/07/2017 1001 Last data filed at 12/07/2017 0800 Gross per 24 hour  Intake 1706.95 ml  Output 1840 ml  Net -133.05 ml   Filed Weights   011-01-19 0355 011-01-19 1009 12/07/17 0215  Weight: 47.6 kg 53.5 kg 60.4 kg    Examination: General: intubated, sedated, paralyzed. HENT: ETT/OGT. Pupils sluggishly reactive to light. No icterus. Lungs: Bilateral breath sounds. No wheezes, crackles, or rhonchi Cardiovascular: s1s2, tachycardic regular rhythm. Monitor reveals sinus tachycardia. No murmur, gallop or rub. Abdomen: G tube in the left upper quadrant. No organomegaly. Extremities: warm to touch. No cyanosis or mottling. No pedal edema. Neuro: sedated and paralyzed.  Resolved Hospital Problem  list    Assessment & Plan:  CP Arrest with Ventilator dependent respiratory failure -etiology unknown  at this time - CTA showed no PE - Continue sepsis therapy - Continue mechanical ventilation - To complete TTM therapy tonight at 2100. [facility] with adjustments based on blood gas results. Needs f/u abg today as an arterial line is in place and will be more accurate than a VBG.  Cerebral Palsy, Spastic Quadriplegia, Dysphagia with PEG, Cognitive Impairment, Non-verbal, Cortical Blindness -continue current supportive care  Dysphagia, GJT-Dependent Hypokalemia -replete potassium as required -Surgery recommends hold enteral feedings until 12/07/17. Will begin tonight.  Seizure Disorder - Continue AED's.    Disposition / Summary of Today's Plan 12/07/17   See above    Diet: enteral feeding Pain/Anxiety/Delirium protocol (if indicated): n/a VAP protocol (if indicated) yes DVT prophylaxis: sub q heparin and scd's GI prophylaxis: PPI, also being used for GERD Hyperglycemia protocol: not required at this time. Mobility:bed bound Code Status: full Family Communication: Discussed care plan with the mother.  Labs   CBC: Recent Labs  Lab 11/21/2017 0315  12/05/17 0500 12/06/17 0549 12/06/17 1112 12/06/17 1931 12/06/17 2107 12/06/17 2303 12/07/17 0111 12/07/17 0504  WBC 4.0   < > 9.5 8.6 10.4 10.2  --   --   --  7.7  NEUTROABS 3.2  --   --   --   --   --   --   --   --   --   HGB 17.3*   < > 12.6 12.5 12.3 11.7* 11.6* 11.9* 11.6* 11.8*  HCT 53.3*   < > 39.6 41.2 39.6 36.5 34.0* 35.0* 34.0* 36.1  MCV 91.3   < > 94.5 97.9 96.8 93.4  --   --   --  90.7  PLT 302   < > 122* 175 133* 127*  --   --   --  96*   < > = values in this interval not displayed.   Basic Metabolic Panel: Recent Labs  Lab 12/06/17 0549 12/06/17 1112 12/06/17 1844 12/06/17 1931 12/06/17 2107 12/06/17 2303 12/07/17 0111 12/07/17 0504  NA 147* 146* 147* 146* 148* 146* 146* 145  K 3.2* 3.0* 3.2* 3.0* 2.5* 3.1* 3.4* 3.3*  CL 114* 115* 117* 116* 112* 113* 113* 116*  CO2 25 21* 25 23  --   --   --  22    GLUCOSE 121* 112* 100* 97 124* 142* 141* 141*  BUN 5* 7 5* 6 4* 5* 5* 6  CREATININE 0.61 0.83 0.74 0.79 0.70 0.60 0.60 0.78  CALCIUM 7.6* 7.5* 7.6* 7.5*  --   --   --  7.4*   GFR: CrCl cannot be calculated (Unknown ideal weight.). Recent Labs  Lab 12/04/17 0014 12/04/17 0746  12/05/17 0730 12/06/17 0549 12/06/17 0719 12/06/17 1112 12/06/17 1931 12/07/17 0504  WBC 4.8  --    < >  --  8.6  --  10.4 10.2 7.7  LATICACIDVEN 3.6* 2.3*  --  2.0*  --  1.7  --   --   --    < > = values in this interval not displayed.   Liver Function Tests: Recent Labs  Lab 11/30/2017 0315 12/04/17 0014 12/06/17 1112  AST 43* 25 27  ALT 49* 32 26  ALKPHOS 65 38 98  BILITOT 0.7 0.4 1.0  PROT 6.1* 4.5* 4.6*  ALBUMIN 2.9* 2.0* 1.9*   No results for input(s): LIPASE, AMYLASE in the last 168 hours. No results  for input(s): AMMONIA in the last 168 hours. ABG    Component Value Date/Time   PHART 7.370 12/06/2017 2051   PCO2ART 36.5 12/06/2017 2051   PO2ART 108.0 12/06/2017 2051   HCO3 18.4 (L) 12/07/2017 0523   TCO2 19 (L) 12/07/2017 0523   ACIDBASEDEF 4.0 (H) 12/07/2017 0523   O2SAT 59.0 12/07/2017 0523    Coagulation Profile: Recent Labs  Lab 12/06/17 1112 12/06/17 1844 12/06/17 1931 12/07/17 0242  INR 2.35 2.44 2.44 2.71   Cardiac Enzymes: Recent Labs  Lab 12/06/17 1931 12/07/17 0046 12/07/17 0644  TROPONINI 0.69* 0.29* 0.22*   HbA1C: No results found for: HGBA1C CBG: Recent Labs  Lab 12/07/17 0000 12/07/17 0053 12/07/17 0210 12/07/17 0747 12/07/17 0823  GLUCAP 135* 134* 123* 120* 106*    Admitting History of Present Illness.   See above Review of Systems:   Unavailable Past medical history  She,  has a past medical history of CP (cerebral palsy) (HCC), Seizures (HCC), Sepsis (HCC) (11/2017), and Vision abnormalities.   Surgical History    Past Surgical History:  Procedure Laterality Date  . IR CM INJ ANY COLONIC TUBE W/FLUORO  2017/12/15  . IR GENERIC  HISTORICAL  05/07/2016   IR CM INJ ANY COLONIC TUBE W/FLUORO 05/07/2016 Jolaine Click, MD [facility]  . IR GENERIC HISTORICAL  06/12/2016   IR REPLC DUODEN/JEJUNO TUBE PERCUT W/FLUORO 06/12/2016 Simonne Come, MD [facility]  . IR REPLC DUODEN/JEJUNO TUBE PERCUT W/FLUORO  03/12/2017  . IR REPLC DUODEN/JEJUNO TUBE PERCUT W/FLUORO  12/02/2017  . REMOVAL OF GASTROSTOMY TUBE       Social History   Social History   Socioeconomic History  . Marital status: Single    Spouse name: Not on file  . Number of children: Not on file  . Years of education: Not on file  . Highest education level: Not on file  Occupational History  . Not on file  Social Needs  . Financial resource strain: Not on file  . Food insecurity:    Worry: Not on file    Inability: Not on file  . Transportation needs:    Medical: Not on file    Non-medical: Not on file  Tobacco Use  . Smoking status: Never Smoker  . Smokeless tobacco: Never Used  Substance and Sexual Activity  . Alcohol use: No  . Drug use: No  . Sexual activity: Never  Lifestyle  . Physical activity:    Days per week: Not on file    Minutes per session: Not on file  . Stress: Not on file  Relationships  . Social connections:    Talks on phone: Not on file    Gets together: Not on file    Attends religious service: Not on file    Active member of club or organization: Not on file    Attends meetings of clubs or organizations: Not on file    Relationship status: Not on file  . Intimate partner violence:    Fear of current or ex partner: Not on file    Emotionally abused: Not on file    Physically abused: Not on file    Forced sexual activity: Not on file  Other Topics Concern  . Not on file  Social History Narrative   Neena stays at home and receives care from her mother and a home health aid.   ,  reports that she has never smoked. She has never used smokeless tobacco. She reports that she does not  drink alcohol or use drugs.   Family  history   Her family history is not on file.   Allergies Allergies  Allergen Reactions  . Erythromycin Diarrhea  . Azithromycin Diarrhea    Home meds  Prior to Admission medications   Medication Sig Start Date End Date Taking? Authorizing Provider  acetaminophen (TYLENOL) 160 MG/5ML suspension Take 7.5 mLs by mouth every 4 (four) hours as needed for fever. 06/28/16  Yes [provider]  albuterol (PROVENTIL) (2.5 MG/3ML) 0.083% nebulizer solution Take 2.5 mg by nebulization every 4 (four) hours as needed (for wheezing, shortness of breath, or chest congestion).  04/13/17  Yes [provider]  AMBULATORY NON FORMULARY MEDICATION Medication Name: Baclofen Suspension 10mg /ml  Take 2 mL by mouth every morning, 1.5 mL at midday, at 1 mL at bedtime Patient taking differently: Medication Name: Baclofen Suspension 10mg /ml: 2 ml's per tube in the morning then 1.5 ml's midday then 1 ml at bedtime 05/21/17  Yes Goodpasture, Inetta Fermo, NP  clotrimazole-betamethasone (LOTRISONE) cream Apply 1 application topically 2 (two) times daily as needed (for scalp irritation).  01/02/14  Yes [provider]  FLUOCINOLONE ACETONIDE SCALP 0.01 % OIL Apply 1 application topically every 30 (thirty) days.  01/29/14  Yes [provider]  Fluocinolone Acetonide Scalp 0.01 % OIL Apply 1 application topically See admin instructions. Apply to scalp once a week 12/06/15  Yes [provider]  HYDROcodone-acetaminophen (HYCET) 7.5-325 mg/15 ml solution Take 15 mLs by mouth every 6 (six) hours as needed for moderate pain. Patient taking differently: Place 7.5 mLs into feeding tube every 6 (six) hours as needed for moderate pain.  08/24/15  Yes Lorre Nick, MD  ibuprofen (ADVIL,MOTRIN) 100 MG/5ML suspension Place 10 mLs into feeding tube every 6 (six) hours as needed for pain. 06/28/16  Yes [provider]  levETIRAcetam (KEPPRA) 100 MG/ML solution Give 5 ml by mouth in the morning and  7.13ml by mouth at night Patient taking differently: Place 500-750 mg into feeding tube See admin instructions. 500 mg per tube in the morning then 750 mg at bedtime 05/21/17  Yes Elveria Rising, NP  MedroxyPROGESTERone Acetate 150 MG/ML SUSY Inject 150 mg into the muscle every 3 (three) months. 04/02/16  Yes [provider]  omeprazole (PRILOSEC) 20 MG capsule 20 mg See admin instructions. 20 mg per tube three times a day    Yes [provider]  PHENObarbital 20 MG/5ML elixir TAKE 2 AND 1/2 TEASPOONFULS TWICE A DAY 06/19/17  Yes Goodpasture, Inetta Fermo, NP  TEGRETOL 100 MG/5ML suspension TAKE 3 TIMES A DAY Patient taking differently: Place 220-240 mg into feeding tube See admin instructions. 11 ml's (220 mg) two times a day per tube and 12 ml's (240 mg) at 7:30 PM in the evening 08/05/17  Yes Goodpasture, Inetta Fermo, NP  AMBULATORY NON FORMULARY MEDICATION Medication Name: Glycopyrrolate 1mg  - compound to 1mg /84ml. Give 0.7ml every 8 hours (for drooling) 05/21/17   Elveria Rising, NP     CRITICAL CARE Performed by: [name]   Total critical care time: 42 minutes  Critical care time was exclusive of separately billable procedures and treating other patients.  Critical care was necessary to treat or prevent imminent or life-threatening deterioration.  Critical care was time spent personally by me on the following activities: development of treatment plan with patient and/or surrogate as well as nursing, discussions with consultants, evaluation of patient's response to treatment, examination of patient, obtaining history from patient or surrogate, ordering and  performing treatments and interventions, ordering and review of laboratory studies, ordering and review of radiographic studies, pulse oximetry and re-evaluation of patient's condition.

## 2017-12-07 NOTE — Progress Notes (Signed)
eLink Physician-Brief Progress Note Patient Name: Angela NevinConella S Spisak DOB: 02/27/1991 MRN: [id number]   Date of Service  12/07/2017  HPI/Events of Note  K+ = 3.4 and Creatinine = 0.70.   eICU Interventions  Will replace K+.      Intervention Category Major Interventions: Electrolyte abnormality - evaluation and management  Napolean Sia Eugene 12/07/2017, 1:21 AM

## 2017-12-07 NOTE — Progress Notes (Signed)
Artic Sun machine set to rewarm pt per protocol, with second nurse verification. Holland Falling(Rebecca Sarine, RN)

## 2017-12-07 NOTE — Progress Notes (Addendum)
NEURO HOSPITALIST PROGRESS NOTE   Subjective: Patient intubated, sedated on fentanyl and versed. Completely cooled at this time. Will start re-warming tonight about 2100 per protocol.   Exam: Vitals:   12/07/17 0715 12/07/17 0747  BP: (!) 120/95 94/74  Pulse: 72 82  Resp: 18 18  Temp:    SpO2: 100% 100%    Physical Exam  HEENT-  Microcephalic , no lesions, without obvious abnormality.  Edematous external eyes bilaterally. Corneal edema present bilaterally.  Cardiovascular- S1-S2 audible, pulses palpable throughout   Lungs-intubated   Saturations within normal limits Abdomen- All 4 quadrants palpated and nontender Extremities- Warm, dry and intact Musculoskeletal-all 4 extremities, contracted, edematous and atrophy noted.  Skin-warm and dry,   Neurological Examination patient cooled @33C  and on fentanyl and versed Mental Status: Patient does not respond to verbal stimuli.  Patient opens eyes to deep sternal rub.  Does not follow commands.  No verbalizations noted.  Cranial Nerves: II: patient does not respond confrontation bilaterally, blind at baseline III,IV,VI:pupils right 2 mm, left 2 mm,andnot reactive bilaterally, doll's response absent bilaterally.  V,VII: corneal reflex absent, no eye twitching VIII: patient does not respond to verbal stimuli IX,X: gag reflex absent, cough absent  XI: trapezius strength unable to test bilaterally XII: tongue strength unable to test Motor/Sensory: No movement in all 4 extremities Deep Tendon Reflexes:  Not tested Plantars: absent bilaterally Cerebellar: Unable to       Medications:  Scheduled: . artificial tears  1 application Both Eyes T6K  . baclofen  10 mg Per Tube QHS  . baclofen  15 mg Per Tube Q1400  . baclofen  20 mg Per Tube Daily  . carBAMazepine  220 mg Per Tube BID   And  . carBAMazepine  240 mg Per Tube Daily  . chlorhexidine gluconate (MEDLINE KIT)  15 mL Mouth Rinse BID  .  Chlorhexidine Gluconate Cloth  6 each Topical Q0600  . fentaNYL (SUBLIMAZE) injection  100 mcg Intravenous Once  . heparin  5,000 Units Subcutaneous Q8H  . iohexol  75 mL Intravenous Once  . levETIRAcetam  500 mg Per Tube Daily  . levETIRAcetam  750 mg Per Tube QHS  . mouth rinse  15 mL Mouth Rinse 10 times per day  . midazolam  2 mg Intravenous Once  . pantoprazole sodium  40 mg Per Tube Daily  . PHENObarbital  50 mg Per Tube BID  . sodium chloride flush  10-40 mL Intracatheter Q12H   Continuous: . sodium chloride 10 mL/hr at 12/07/17 0700  . cisatracurium (NIMBEX) infusion 1 mcg/kg/min (12/07/17 0700)  . fentaNYL infusion INTRAVENOUS 100 mcg/hr (12/07/17 0700)  . lactated ringers Stopped (12/07/17 0654)  . midazolam (VERSED) infusion 2 mg/hr (12/07/17 0700)  . norepinephrine (LEVOPHED) Adult infusion 3.5 mcg/min (12/07/17 0700)  . piperacillin-tazobactam (ZOSYN)  IV 3.375 g (12/07/17 0817)  . potassium chloride 10 mEq (12/07/17 0815)   OEC:[id number] **OR** acetaminophen, albuterol, [COMPLETED] cisatracurium **AND** cisatracurium (NIMBEX) infusion **AND** cisatracurium, fentaNYL, midazolam, ondansetron **OR** ondansetron (ZOFRAN) IV, sodium chloride flush  Pertinent Labs/Diagnostics: Dg Chest 1 View  Result Date: 12/06/2017  IMPRESSION: Retraction of endotracheal tube, now 2.2 cm above the carina. Improved aeration of the left lung Persistent low lung volumes with scattered airspace disease and atelectasis Suspect left pleural effusion layering posteriorly No large pneumothorax Electronically Signed   By: Jerilynn Mages.  Shick M.D.  On: 12/06/2017 10:56   Ct Head Wo Contrast  Result Date: 12/06/2017  IMPRESSION: Cerebral, cerebellar, and brainstem atrophy, likely congenital or longstanding. 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 Scaphocephalic skull constrains the cerebral hemispheres, likely premature fusion of the  sagittal suture, but there is no fracture or worrisome osseous lesion. Ventricles are decompressed by a LEFT occipital shunt catheter. 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 Electronically Signed   By: Staci Righter M.D.   On: 12/06/2017 17:05   Ct Angio Chest Pe W Or Wo Contrast  Result Date: 12/06/2017  IMPRESSION: 1. No evidence of acute pulmonary embolism. 2. Support system appears adequately positioned within the chest. 3. Moderate dependent bilateral pleural effusions with associated bibasilar atelectasis. Additional patchy airspace opacities in both upper lobes, suspicious for pneumonia or pulmonary hemorrhage. 4. Extraluminal gas in the upper abdomen, surrounding the stomach and gastrohepatic ligament has mildly improved. No extravasated enteric contrast is identified. 5. The volume of ascites has mildly increased, especially inferior to the liver. No well-defined or peripherally enhancing intra-abdominal fluid collections are seen to suggest abscess. 6. Resolved small bowel distension consistent with improving ileus. Electronically Signed   By: Richardean Sale M.D.   On: 12/06/2017 14:21   Ct Abdomen Pelvis W Contrast  Result Date: 12/06/2017 . IMPRESSION: 1. No evidence of acute pulmonary embolism. 2. Support system appears adequately positioned within the chest. 3. Moderate dependent bilateral pleural effusions with associated bibasilar atelectasis. Additional patchy airspace opacities in both upper lobes, suspicious for pneumonia or pulmonary hemorrhage. 4. Extraluminal gas in the upper abdomen, surrounding the stomach and gastrohepatic ligament has mildly improved. No extravasated enteric contrast is identified. 5. The volume of ascites has mildly increased, especially inferior to the liver. No well-defined or peripherally enhancing intra-abdominal fluid collections are seen to suggest abscess. 6. Resolved small bowel distension consistent with improving ileus. Electronically Signed   By:  Richardean Sale M.D.   On: 12/06/2017 14:21   Dg Chest Port 1 View  Result Date: 12/07/2017 . IMPRESSION: Low lung volumes. Multifocal patchy opacities, better evaluated on recent CT. Small bilateral pleural effusions. Endotracheal tube terminates 10 mm above the carina. Enteric tube courses into the stomach. Electronically Signed   By: Julian Hy M.D.   On: 12/07/2017 07:38    Assessment:  With PMH Cerebral palsy with intellectual disability and seizure disorder, severe spastic quadriparesis, dysphagia with GJT, cortical blindness who presented for evaluation of fevers. Neurology consulted after cardiac arrest. EEG showed a burst suppression patterm. Repeat EEG needed off sedation and once re-warmed. Patient currently intubated sedated with fentanyl and versed and completely cooled.   Recommendations:  Routine EEG tomorrow after rewarming  --Continue Levetiracetam --continue Phenobarbital -- continue tegretol - will re-assess after re-warmed and off all sedation  - neurology to continue to follow  Laurey Morale, MSN, NP-C [facility] [phone]    12/07/2017, 8:17 AM    NEUROHOSPITALIST ADDENDUM Performed a face to face diagnostic evaluation.   I have reviewed the contents of history and physical exam as documented by PA/ARNP/Resident and agree with above documentation.  I have discussed and formulated the above plan as documented. Edits to the note have been made as needed.  Complete examination done by ARNP.  Patient was on sedation.  She is being rewarmed tonight. Neurology will repeat EEG and  will reassess tomorrow.    Karena Addison Kayzen Kendzierski MD [facility] [phone]  If 7pm to 7am, please call on call as listed on AMION.

## 2017-12-07 NOTE — Progress Notes (Signed)
eLink Physician-Brief Progress Note Patient Name: Angela NevinConella S Frank DOB: 02/23/1991 MRN: [id number]   Date of Service  12/07/2017  HPI/Events of Note  K+ = 3.3 and Creatinine = 0.78.   eICU Interventions  Will replace K+.      Intervention Category Major Interventions: Electrolyte abnormality - evaluation and management  Sommer,Steven Eugene 12/07/2017, 6:39 AM

## 2017-12-08 ENCOUNTER — Inpatient Hospital Stay (HOSPITAL_COMMUNITY): Payer: Medicare Other

## 2017-12-08 ENCOUNTER — Other Ambulatory Visit (HOSPITAL_COMMUNITY): Payer: Medicare Other

## 2017-12-08 DIAGNOSIS — J9600 Acute respiratory failure, unspecified whether with hypoxia or hypercapnia: Secondary | ICD-10-CM

## 2017-12-08 LAB — BASIC METABOLIC PANEL
ANION GAP: 9 (ref 5–15)
Anion gap: 5 (ref 5–15)
Anion gap: 7 (ref 5–15)
BUN: <5 mg/dL — ABNORMAL LOW (ref 6–20)
BUN: <5 mg/dL — ABNORMAL LOW (ref 6–20)
BUN: <5 mg/dL — ABNORMAL LOW (ref 6–20)
CALCIUM: 7.7 mg/dL — AB (ref 8.9–10.3)
CO2: 21 mmol/L — ABNORMAL LOW (ref 22–32)
CO2: 24 mmol/L (ref 22–32)
CO2: 23 mmol/L (ref 22–32)
CREATININE: 0.75 mg/dL (ref 0.44–1.00)
CREATININE: 0.78 mg/dL (ref 0.44–1.00)
CREATININE: 0.81 mg/dL (ref 0.44–1.00)
Calcium: 7.4 mg/dL — ABNORMAL LOW (ref 8.9–10.3)
Calcium: 7.6 mg/dL — ABNORMAL LOW (ref 8.9–10.3)
Chloride: 114 mmol/L — ABNORMAL HIGH (ref 98–111)
Chloride: 116 mmol/L — ABNORMAL HIGH (ref 98–111)
Chloride: 115 mmol/L — ABNORMAL HIGH (ref 98–111)
GLUCOSE: 124 mg/dL — AB (ref 70–99)
Glucose, Bld: 134 mg/dL — ABNORMAL HIGH (ref 70–99)
Glucose, Bld: 134 mg/dL — ABNORMAL HIGH (ref 70–99)
Potassium: 3.7 mmol/L (ref 3.5–5.1)
Potassium: 3.7 mmol/L (ref 3.5–5.1)
Potassium: 3.7 mmol/L (ref 3.5–5.1)
Sodium: 144 mmol/L (ref 135–145)
Sodium: 145 mmol/L (ref 135–145)
Sodium: 145 mmol/L (ref 135–145)

## 2017-12-08 LAB — CULTURE, BLOOD (ROUTINE X 2)
CULTURE: NO GROWTH
Culture: NO GROWTH
Special Requests: ADEQUATE

## 2017-12-08 LAB — GLUCOSE, CAPILLARY
GLUCOSE-CAPILLARY: 117 mg/dL — AB (ref 70–99)
GLUCOSE-CAPILLARY: 125 mg/dL — AB (ref 70–99)
Glucose-Capillary: 127 mg/dL — ABNORMAL HIGH (ref 70–99)
Glucose-Capillary: 139 mg/dL — ABNORMAL HIGH (ref 70–99)
Glucose-Capillary: 146 mg/dL — ABNORMAL HIGH (ref 70–99)
Glucose-Capillary: 104 mg/dL — ABNORMAL HIGH (ref 70–99)
Glucose-Capillary: 106 mg/dL — ABNORMAL HIGH (ref 70–99)
Glucose-Capillary: 109 mg/dL — ABNORMAL HIGH (ref 70–99)
Glucose-Capillary: 101 mg/dL — ABNORMAL HIGH (ref 70–99)
Glucose-Capillary: 105 mg/dL — ABNORMAL HIGH (ref 70–99)

## 2017-12-08 MED ORDER — METOPROLOL TARTRATE 5 MG/5ML IV SOLN
2.5000 mg | INTRAVENOUS | Status: DC | PRN
  Administered 2017-12-08 – 2017-12-13 (×6): 2.5 mg via INTRAVENOUS
  Administered 2017-12-13: 5 mg via INTRAVENOUS
  Administered 2017-12-14: 2.5 mg via INTRAVENOUS
  Administered 2017-12-14 – 2017-12-16 (×9): 5 mg via INTRAVENOUS
  Filled 2017-12-08 (×16): qty 5

## 2017-12-08 MED ORDER — VITAL AF 1.2 CAL PO LIQD
1000.0000 mL | ORAL | Status: DC
  Administered 2017-12-08 – 2017-12-10 (×4): 1000 mL

## 2017-12-08 NOTE — Progress Notes (Signed)
EEG Completed; Results Pending. Notified Dr Otelia LimesLindzen per his request.

## 2017-12-08 NOTE — Progress Notes (Signed)
Subjective/Chief Complaint: On vent   Objective: Vital signs in last 24 hours: Temp:  [91.2 F (32.9 C)-96.4 F (35.8 C)] 96.4 F (35.8 C) (09/23 0700) Pulse Rate:  [70-117] 104 (09/23 0800) Resp:  [0-25] 20 (09/23 0800) BP: (78-106)/(53-86) 96/73 (09/23 0800) SpO2:  [97 %-100 %] 99 % (09/23 0800) Arterial Line BP: (90-127)/(52-81) 112/66 (09/23 0800) FiO2 (%):  [40 %] 40 % (09/23 0800) Weight:  [60.5 kg] 60.5 kg (09/23 0600) Last BM Date: 12/01/17  Intake/Output from previous day: 09/22 0701 - 09/23 0700 In: 2443.2 [I.V.:1995.5; IV Piggyback:327.7] Out: 920 [Urine:920] Intake/Output this shift: Total I/O In: 112.6 [I.V.:111.5; IV Piggyback:1.1] Out: -   General appearance: on vent Resp: rhonchi bilaterally Cardio: regular rate and rhythm GI: soft, GJ tube site looks OK, nontender, some BS, cooling pads Neurologic: Mental status: sedated on vent  Lab Results:  Recent Labs    12/06/17 1931  12/07/17 0111 12/07/17 0504  WBC 10.2  --   --  7.7  HGB 11.7*   < > 11.6* 11.8*  HCT 36.5   < > 34.0* 36.1  PLT 127*  --   --  96*   < > = values in this interval not displayed.   BMET Recent Labs    12/08/17 0055 12/08/17 0505  NA 144 145  K 3.7 3.7  CL 114* 116*  CO2 21* 24  GLUCOSE 134* 134*  BUN <5* <5*  CREATININE 0.81 0.75  CALCIUM 7.4* 7.6*   PT/INR Recent Labs    12/06/17 1931 12/07/17 0242  LABPROT 26.3* 28.6*  INR 2.44 2.71   ABG Recent Labs    12/06/17 2051 12/07/17 0523 12/07/17 1016  PHART 7.370  --  7.543*  HCO3 21.9 18.4* 18.7*    Studies/Results: Dg Chest 1 View  Result Date: 12/06/2017 CLINICAL DATA:  Difficult intubation EXAM: CHEST  1 VIEW COMPARISON:  12/06/2017 FINDINGS: Endotracheal tube 2.2 cm above the carina. NG tube extends to the stomach. Right subclavian central line tip at the mid SVC level. Mediastinal drains noted. Defibrillator pads overlie the chest. Contrast within the bowel in the epigastric region. Low lung  volumes persist. There is improved aeration of the left lung following retraction of the endotracheal tube. Scattered airspace disease and atelectasis remains. Left effusion not excluded. No pneumothorax. IMPRESSION: Retraction of endotracheal tube, now 2.2 cm above the carina. Improved aeration of the left lung Persistent low lung volumes with scattered airspace disease and atelectasis Suspect left pleural effusion layering posteriorly No large pneumothorax Electronically Signed   By: Judie Petit.  Shick M.D.   On: 12/06/2017 10:56   Ct Head Wo Contrast  Result Date: 12/06/2017 CLINICAL DATA:  Seizures.  Cerebral palsy. EXAM: CT HEAD WITHOUT CONTRAST TECHNIQUE: Contiguous axial images were obtained from the base of the skull through the vertex without intravenous contrast. COMPARISON:  None. FINDINGS: Brain: Cerebral hemispheres are constrained by an abnormal scaphocephalic cranial vault. There is premature atrophy for age. The ventricles are decompressed by a LEFT occipital shunt catheter. Hypoplastic brainstem and cerebellum. Absent corpus callosum. Vascular: No hyperdense vessel. Skull: Marked scaphocephaly, query premature closure of the sagittal suture. Large burr hole placement in the LEFT occipital bone for the ventriculoperitoneal shunt catheter. No worrisome osseous lesion or fracture. Thickened calvarium, likely related to anticonvulsant use. Sinuses/Orbits: No acute finding. Other: No middle ear or mastoid fluid. The patient appears to be endotracheally intubated. IMPRESSION: Cerebral, cerebellar, and brainstem atrophy, likely congenital or longstanding. 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 Scaphocephalic skull constrains the cerebral hemispheres, likely premature fusion of the sagittal suture, but there is no fracture or worrisome osseous lesion. Ventricles are decompressed by a LEFT occipital shunt catheter. 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 Electronically Signed   By: Elsie Stain M.D.   On: 12/06/2017 17:05   Ct Angio Chest Pe W Or Wo Contrast  Result Date: 12/06/2017 CLINICAL DATA:  Status post cardiac arrest. History of seizures and cerebral palsy. EXAM: CT ANGIOGRAPHY CHEST CT ABDOMEN AND PELVIS WITH CONTRAST TECHNIQUE: Multidetector CT imaging of the chest was performed using the standard protocol during bolus administration of intravenous contrast. Multiplanar CT image reconstructions and MIPs were obtained to evaluate the vascular anatomy. Multidetector CT imaging of the abdomen and pelvis was performed using the standard protocol during bolus administration of intravenous contrast. CONTRAST:  80mL ISOVUE-370 IOPAMIDOL (ISOVUE-370) INJECTION 76% COMPARISON:  Abdominopelvic CT 12/04/2017 and 12/07/2017. Chest radiographs today. FINDINGS: CTA CHEST FINDINGS Cardiovascular: The pulmonary arteries are well opacified with contrast to the level of the subsegmental branches. There is no evidence of acute pulmonary embolism. The thoracic aorta and great vessels appear normal. Right arm PICC extends to the mid SVC. The heart size is normal. There is no pericardial effusion. Mediastinum/Nodes: There are no enlarged mediastinal, hilar or axillary lymph nodes. There is no evidence of mediastinal hematoma. Endotracheal and enteric tubes are in place. Lungs/Pleura: There are moderate size dependent pleural effusions bilaterally there is no pneumothorax. Dependent airspace opacities at both lung bases likely represent atelectasis. There are additional patchy airspace opacities in both upper lobes, likely pneumonia or hemorrhage. No lung mass or endobronchial lesion identified. Musculoskeletal/Chest wall: Generalized edema throughout the chest wall. No soft tissue mass or acute osseous findings are demonstrated. Review of the MIP images confirms the above findings. CT ABDOMEN AND PELVIS FINDINGS Hepatobiliary: The liver has a stable  appearance without intrinsic abnormality. No evidence of gallstones, gallbladder wall thickening or biliary dilatation. Pancreas: Unremarkable. No pancreatic ductal dilatation or surrounding inflammatory changes. Spleen: Normal in size without focal abnormality. Adrenals/Urinary Tract: Both adrenal glands appear normal. No focal renal abnormalities are identified. There is no evidence of urinary tract calculus or hydronephrosis. The bladder appears normal. Stomach/Bowel: Enteric tube extends into the proximal stomach. There is a percutaneous gastrojejunostomy which extends into the proximal jejunum. The stomach remains decompressed. There is no significant residual small or large bowel distension. There is mild diffuse small bowel wall thickening. Multiple extraluminal air bubbles are again noted within the upper abdomen, surrounding the stomach and gastrohepatic ligament. These have slightly decreased in overall volume over the last 2 days. There is no extravasated enteric contrast. Vascular/Lymphatic: There are no enlarged abdominal or pelvic lymph nodes. No significant vascular findings. Reproductive: The uterus and ovaries appear normal. No adnexal mass. Other: Again demonstrated is moderate ascites. There is increased fluid in the right upper quadrant, inferior to the liver and gallbladder, measuring up to 11.0 x 4.0 cm on image 20/5. Other interloop and pelvic components of this ascites have not significantly changed. There are no well-defined or peripherally enhancing fluid collections. Ventricular peritoneal shunt tubing extends into the right pelvis. Musculoskeletal: No acute osseous findings. Thoracolumbar scoliosis, postsurgical changes in the left bony pelvis and proximal femurs again noted. There is generalized edema throughout the subcutaneous fat. Review of the MIP images confirms the above findings. IMPRESSION: 1. No evidence of acute pulmonary embolism. 2. Support  system appears adequately  positioned within the chest. 3. Moderate dependent bilateral pleural effusions with associated bibasilar atelectasis. Additional patchy airspace opacities in both upper lobes, suspicious for pneumonia or pulmonary hemorrhage. 4. Extraluminal gas in the upper abdomen, surrounding the stomach and gastrohepatic ligament has mildly improved. No extravasated enteric contrast is identified. 5. The volume of ascites has mildly increased, especially inferior to the liver. No well-defined or peripherally enhancing intra-abdominal fluid collections are seen to suggest abscess. 6. Resolved small bowel distension consistent with improving ileus. Electronically Signed   By: Carey Bullocks M.D.   On: 12/06/2017 14:21   Ct Abdomen Pelvis W Contrast  Result Date: 12/06/2017 CLINICAL DATA:  Status post cardiac arrest. History of seizures and cerebral palsy. EXAM: CT ANGIOGRAPHY CHEST CT ABDOMEN AND PELVIS WITH CONTRAST TECHNIQUE: Multidetector CT imaging of the chest was performed using the standard protocol during bolus administration of intravenous contrast. Multiplanar CT image reconstructions and MIPs were obtained to evaluate the vascular anatomy. Multidetector CT imaging of the abdomen and pelvis was performed using the standard protocol during bolus administration of intravenous contrast. CONTRAST:  80mL ISOVUE-370 IOPAMIDOL (ISOVUE-370) INJECTION 76% COMPARISON:  Abdominopelvic CT 12/04/2017 and 11/16/2017. Chest radiographs today. FINDINGS: CTA CHEST FINDINGS Cardiovascular: The pulmonary arteries are well opacified with contrast to the level of the subsegmental branches. There is no evidence of acute pulmonary embolism. The thoracic aorta and great vessels appear normal. Right arm PICC extends to the mid SVC. The heart size is normal. There is no pericardial effusion. Mediastinum/Nodes: There are no enlarged mediastinal, hilar or axillary lymph nodes. There is no evidence of mediastinal hematoma. Endotracheal and  enteric tubes are in place. Lungs/Pleura: There are moderate size dependent pleural effusions bilaterally there is no pneumothorax. Dependent airspace opacities at both lung bases likely represent atelectasis. There are additional patchy airspace opacities in both upper lobes, likely pneumonia or hemorrhage. No lung mass or endobronchial lesion identified. Musculoskeletal/Chest wall: Generalized edema throughout the chest wall. No soft tissue mass or acute osseous findings are demonstrated. Review of the MIP images confirms the above findings. CT ABDOMEN AND PELVIS FINDINGS Hepatobiliary: The liver has a stable appearance without intrinsic abnormality. No evidence of gallstones, gallbladder wall thickening or biliary dilatation. Pancreas: Unremarkable. No pancreatic ductal dilatation or surrounding inflammatory changes. Spleen: Normal in size without focal abnormality. Adrenals/Urinary Tract: Both adrenal glands appear normal. No focal renal abnormalities are identified. There is no evidence of urinary tract calculus or hydronephrosis. The bladder appears normal. Stomach/Bowel: Enteric tube extends into the proximal stomach. There is a percutaneous gastrojejunostomy which extends into the proximal jejunum. The stomach remains decompressed. There is no significant residual small or large bowel distension. There is mild diffuse small bowel wall thickening. Multiple extraluminal air bubbles are again noted within the upper abdomen, surrounding the stomach and gastrohepatic ligament. These have slightly decreased in overall volume over the last 2 days. There is no extravasated enteric contrast. Vascular/Lymphatic: There are no enlarged abdominal or pelvic lymph nodes. No significant vascular findings. Reproductive: The uterus and ovaries appear normal. No adnexal mass. Other: Again demonstrated is moderate ascites. There is increased fluid in the right upper quadrant, inferior to the liver and gallbladder, measuring up  to 11.0 x 4.0 cm on image 20/5. Other interloop and pelvic components of this ascites have not significantly changed. There are no well-defined or peripherally enhancing fluid collections. Ventricular peritoneal shunt tubing extends into the right pelvis. Musculoskeletal: No acute osseous findings. Thoracolumbar scoliosis, postsurgical changes in the  left bony pelvis and proximal femurs again noted. There is generalized edema throughout the subcutaneous fat. Review of the MIP images confirms the above findings. IMPRESSION: 1. No evidence of acute pulmonary embolism. 2. Support system appears adequately positioned within the chest. 3. Moderate dependent bilateral pleural effusions with associated bibasilar atelectasis. Additional patchy airspace opacities in both upper lobes, suspicious for pneumonia or pulmonary hemorrhage. 4. Extraluminal gas in the upper abdomen, surrounding the stomach and gastrohepatic ligament has mildly improved. No extravasated enteric contrast is identified. 5. The volume of ascites has mildly increased, especially inferior to the liver. No well-defined or peripherally enhancing intra-abdominal fluid collections are seen to suggest abscess. 6. Resolved small bowel distension consistent with improving ileus. Electronically Signed   By: Carey Bullocks M.D.   On: 12/06/2017 14:21   Dg Chest Port 1 View  Result Date: 12/07/2017 CLINICAL DATA:  Respiratory failure EXAM: PORTABLE CHEST 1 VIEW COMPARISON:  CT chest dated 12/06/2017 FINDINGS: Low lung volumes. Mild patchy opacities in the right upper lobe and bilateral lower lobes, better evaluated on prior CT. Small bilateral pleural effusions. No pneumothorax. Endotracheal tube terminates 10 mm above the carina. Enteric tube courses into the stomach. Defibrillator pads overlying the left hemithorax. Contrast within colon in the upper abdomen. IMPRESSION: Low lung volumes. Multifocal patchy opacities, better evaluated on recent CT. Small  bilateral pleural effusions. Endotracheal tube terminates 10 mm above the carina. Enteric tube courses into the stomach. Electronically Signed   By: Charline Bills M.D.   On: 12/07/2017 07:38   Dg Chest Port 1 View  Result Date: 12/06/2017 CLINICAL DATA:  Endotracheal tube placement. EXAM: PORTABLE CHEST 1 VIEW COMPARISON:  Radiographs 12/04/2017 and 04/24/2017. Abdominal CT 12/04/2017. FINDINGS: 1025 hours. Endotracheal tube extends into the right mainstem bronchus and should be retracted approximately 4 cm. Enteric tube projects below the diaphragm, tip not visualized. Ventricular peritoneal shunt catheter overlies the chest and upper abdomen. There is a right subclavian central venous catheter projecting to the level of the lower SVC. There is complete collapse of the left lung. There is mild perihilar atelectasis on the right. No significant pleural effusion or pneumothorax. IMPRESSION: 1. Right mainstem bronchus intubation with resulting complete left lung collapse. The endotracheal tube should be withdrawn approximately 4 cm. 2. Central line and enteric tube appear adequately positioned. 3. These results were called by telephone at the time of interpretation on 12/06/2017 at 10:55 am to the patient's nurse Junious Dresser, who verbally acknowledged these results. Transmission of results delayed by transfer of patient to a different ward and 3 telephone calls required before phone was answered. Electronically Signed   By: Carey Bullocks M.D.   On: 12/06/2017 10:58    Anti-infectives: Anti-infectives (From admission, onward)   Start     Dose/Rate Route Frequency Ordered Stop   12/07/17 0000  piperacillin-tazobactam (ZOSYN) IVPB 3.375 g     3.375 g 12.5 mL/hr over 240 Minutes Intravenous Every 8 hours 12/06/17 2231     12/05/17 1400  piperacillin-tazobactam (ZOSYN) IVPB 3.375 g  Status:  Discontinued     3.375 g 12.5 mL/hr over 240 Minutes Intravenous Every 8 hours 12/05/17 1235 12/06/17 2231   12/02/2017  1800  vancomycin (VANCOCIN) IVPB 750 mg/150 ml premix  Status:  Discontinued     750 mg 150 mL/hr over 60 Minutes Intravenous Every 12 hours 12/13/2017 1120 12/04/2017 1123   12/05/2017 1800  vancomycin (VANCOCIN) 500 mg in sodium chloride 0.9 % 100 mL IVPB  Status:  Discontinued  500 mg 100 mL/hr over 60 Minutes Intravenous Every 8 hours 12/07/2017 1123 12/05/17 0633   12/13/2017 1300  vancomycin (VANCOCIN) 500 mg in sodium chloride 0.9 % 100 mL IVPB  Status:  Discontinued     500 mg 100 mL/hr over 60 Minutes Intravenous Every 8 hours 12/09/2017 0905 12/01/2017 1120   12/13/2017 1200  ceFEPIme (MAXIPIME) 1 g in sodium chloride 0.9 % 100 mL IVPB  Status:  Discontinued     1 g 200 mL/hr over 30 Minutes Intravenous Every 8 hours 12/11/2017 0905 12/05/17 1235   12/12/2017 0400  ceFEPIme (MAXIPIME) 2 g in sodium chloride 0.9 % 100 mL IVPB     2 g 200 mL/hr over 30 Minutes Intravenous  Once 11/16/2017 0356 12/05/2017 0448   11/23/2017 0400  metroNIDAZOLE (FLAGYL) IVPB 500 mg  Status:  Discontinued     500 mg 100 mL/hr over 60 Minutes Intravenous Every 8 hours 12/02/2017 0356 12/05/17 1235   12/09/2017 0400  vancomycin (VANCOCIN) IVPB 1000 mg/200 mL premix     1,000 mg 200 mL/hr over 60 Minutes Intravenous  Once 11/29/2017 0356 11/27/2017 0655      Assessment/Plan: Cerebral palsy with chronic spastic quadraparesis S/P change of chronic G-J tube by Dr. Loreta AveWagner 9/17 - tube appears in place. Findings on CT of possible contained perforation. TF were held for this. OK to start trickle TF today and I D/W Dr. Vassie LollAlva. ID - on Zosyn Cardiac arrest - cooled per CCM VDRF and PNA - per CCM  Palliative Care input noted  I spoke with her caregiver   LOS: 5 days    Liz MaladyBurke E Syeda Prickett 12/08/2017

## 2017-12-08 NOTE — Progress Notes (Signed)
Preliminary EEG review: Bursts on a suppressed background. No electrographic seizures seen.   Electronically signed: Dr. Caryl PinaEric Takiah Maiden

## 2017-12-08 NOTE — Progress Notes (Addendum)
Nutrition Follow Up  DOCUMENTATION CODES:   Not applicable  INTERVENTION:    Initiate TF via G-J tube with Vital AF 1.2 at trickle rate of 15 ml/hr   Recommend Vital AF 1.2 at goal rate of 45 ml/hr  Provides 1296 kcals, 81 gm protein, 876 ml of free water daily  NUTRITION DIAGNOSIS:   Inadequate oral intake related to inability to eat as evidenced by NPO status(home TF via G/J tube), ongoing  GOAL:   Patient will meet greater than or equal to 90% of their needs, progressing  MONITOR:   Vent status, TF tolerance, Labs, Weight trends, Skin, I & O's  ASSESSMENT:   27 yo female with PMH of cerebral palsy, intellectual disability, severe spastic quadriparesis, cortical blindness, dysphagia, G/J tube, and seizures who was admitted with sepsis, PNA, questionable aspiration.  9/17 G-J tube exchange per IR  Patient is currently intubated on ventilator support MV: 8.7 L/min Temp (24hrs), Avg:94 F (34.4 C), Min:91.4 F (33 C), Max:98.6 F (37 C)  Patient is s/p cardiac arrest 9/21. Transferred to the ICU. CT showed possible perforation. Subsequently TF was stopped.  She underwent hypothermia protocol. Now rewarming.  Palliative Medicine Team note 9/19 reviewed. Labs and medications reviewed. CBG's [id number].  Diet Order:   Diet Order            Diet NPO time specified  Diet effective now             EDUCATION NEEDS:   No education needs have been identified at this time  Skin:  Skin Assessment: Reviewed RN Assessment  Last BM:  9/16   Intake/Output Summary (Last 24 hours) at 12/08/2017 1329 Last data filed at 12/08/2017 1200 Gross per 24 hour  Intake 2268.81 ml  Output 865 ml  Net 1403.81 ml   Height:   Ht Readings from Last 1 Encounters:  12/08/17 4' (1.219 m)   Weight:   Wt Readings from Last 1 Encounters:  12/08/17 65.5 kg   Estimated Nutritional Needs:   Kcal:  1306  Protein:  75-90 gm  Fluid:  per MD  Maureen ChattersKatie Erabella Kuipers, RD,  LDN Pager #: [phone] After-Hours Pager #: [phone]

## 2017-12-08 NOTE — Progress Notes (Signed)
EEG Completed; Results Pending  

## 2017-12-08 NOTE — Progress Notes (Signed)
Subjective: Now being rewarmed. Projected to be fully rewarmed at about noon, per RN. Versed at low rate of 1 mg/hr.   Objective: Current vital signs: BP 96/73 (BP Location: Left Arm)   Pulse (!) 104   Temp (!) 96.4 F (35.8 C) (Bladder)   Resp 20   Wt 60.5 kg   SpO2 99%  Vital signs in last 24 hours: Temp:  [91.2 F (32.9 C)-96.4 F (35.8 C)] 96.4 F (35.8 C) (09/23 0700) Pulse Rate:  [70-117] 104 (09/23 0800) Resp:  [0-25] 20 (09/23 0800) BP: (78-106)/(53-86) 96/73 (09/23 0800) SpO2:  [97 %-100 %] 99 % (09/23 0800) Arterial Line BP: (90-127)/(52-81) 112/66 (09/23 0800) FiO2 (%):  [40 %] 40 % (09/23 0800) Weight:  [60.5 kg] 60.5 kg (09/23 0600)  Intake/Output from previous day: 09/22 0701 - 09/23 0700 In: 2443.2 [I.V.:1995.5; IV Piggyback:327.7] Out: 920 [Urine:920] Intake/Output this shift: Total I/O In: 112.6 [I.V.:111.5; IV Piggyback:1.1] Out: -  Nutritional status:  Diet Order            Diet NPO time specified  Diet effective now             HEENT: Microcephalic Lungs: Intubated Ext: Small, contracted limbs x 4  Neurologic Exam: Mental Status: Eyes closed, do not open to verbal or tactile stimulation. No responses to any external stimuli. No spontaneous movement.  Cranial Nerves: II:  Pupils 2 mm and unreactive. No blink to threat.  III,IV, VI: No doll's eye reflex. Occasional medial jerking of left eye when stimulated. Mild exotropia. Occasional eyelid twitching with stimulation.  V,VII: In the context of intubation, face is flaccidly symmetric. Weak left corneal, absent right corneal reflex.  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 No movement to any stimulus.  Deep Tendon Reflexes:  0 upper and lower extremities bilaterally Plantars: Mute bilaterally  Cerebellar/Gait: Unable to assess  Lab Results: Results for orders placed or  performed during the hospital encounter of 12/02/2017 (from the past 48 hour(s))  Comprehensive metabolic panel     Status: Abnormal   Collection Time: 12/06/17 11:12 AM  Result Value Ref Range   Sodium 146 (H) 135 - 145 mmol/L   Potassium 3.0 (L) 3.5 - 5.1 mmol/L   Chloride 115 (H) 98 - 111 mmol/L   CO2 21 (L) 22 - 32 mmol/L   Glucose, Bld 112 (H) 70 - 99 mg/dL   BUN 7 6 - 20 mg/dL   Creatinine, Ser 0.83 0.44 - 1.00 mg/dL   Calcium 7.5 (L) 8.9 - 10.3 mg/dL   Total Protein 4.6 (L) 6.5 - 8.1 g/dL   Albumin 1.9 (L) 3.5 - 5.0 g/dL   AST 27 15 - 41 U/L   ALT 26 0 - 44 U/L   Alkaline Phosphatase 98 38 - 126 U/L   Total Bilirubin 1.0 0.3 - 1.2 mg/dL   GFR calc non Af Amer >60 >60 mL/min   GFR calc Af Amer >60 >60 mL/min    Comment: (NOTE) The eGFR has been calculated using the CKD EPI equation. This calculation has not been validated in all clinical situations. eGFR's persistently <60 mL/min signify possible Chronic Kidney Disease.    Anion gap 10 5 - 15    Comment: Performed at [facility] [address] [place], [place] [address]  CBC     Status: Abnormal   Collection Time: 12/06/17 11:12 AM  Result Value Ref Range   WBC 10.4 4.0 - 10.5 K/uL   RBC 4.09 3.87 - 5.11 MIL/uL   Hemoglobin 12.3 12.0 - 15.0 g/dL   HCT 39.6 36.0 - 46.0 %   MCV 96.8 78.0 - 100.0 fL   MCH 30.1 26.0 - 34.0 pg   MCHC 31.1 30.0 - 36.0 g/dL   RDW 14.4 11.5 - 15.5 %   Platelets 133 (L) 150 - 400 K/uL    Comment: Performed at [facility] [address], [place], [place] [address]  Protime-INR     Status: Abnormal   Collection Time: 12/06/17 11:12 AM  Result Value Ref Range   Prothrombin Time 25.5 (H) 11.4 - 15.2 seconds   INR 2.35     Comment: Performed at [facility], [place] [address] [place], [place] [address]  Glucose, capillary     Status: None   Collection Time: 12/06/17 11:25 AM  Result Value Ref Range   Glucose-Capillary 87 70 - 99 mg/dL   Comment 1 Venous Specimen   I-STAT  3, venous blood gas (G3P V)     Status: Abnormal   Collection Time: 12/06/17 11:42 AM  Result Value Ref Range   pH, Ven 7.230 (L) 7.250 - 7.430   pCO2, Ven 52.8 44.0 - 60.0 mmHg   pO2, Ven 74.0 (H) 32.0 - 45.0 mmHg   Bicarbonate 22.0 20.0 - 28.0 mmol/L   TCO2 24 22 - 32 mmol/L   O2 Saturation 91.0 %   Acid-base deficit 6.0 (H) 0.0 - 2.0 mmol/L   Patient temperature 99.0 F    Collection site [employer] by Nurse    Sample type VENOUS   I-STAT 3, venous blood gas (G3P V)     Status: Abnormal   Collection Time: 12/06/17  1:10 PM  Result Value Ref Range   pH, Ven 7.264 7.250 - 7.430   pCO2, Ven 49.6 44.0 - 60.0 mmHg   pO2, Ven 60.0 (H) 32.0 - 45.0 mmHg   Bicarbonate 22.5 20.0 - 28.0 mmol/L   TCO2 24 22 - 32 mmol/L   O2 Saturation 86.0 %   Acid-base deficit 5.0 (H) 0.0 - 2.0 mmol/L   Patient temperature HIDE    Sample type VENOUS   Culture, respiratory (non-expectorated)     Status: None (Preliminary result)   Collection Time: 12/06/17  2:45 PM  Result Value Ref Range   Specimen Description TRACHEAL ASPIRATE    Special Requests Normal    Gram Stain      FEW WBC PRESENT, PREDOMINANTLY PMN NO ORGANISMS SEEN    Culture      CULTURE REINCUBATED FOR BETTER GROWTH Performed at [facility], [address], [place], [place] [address]    Report Status PENDING   Glucose, capillary     Status: None   Collection Time: 12/06/17  3:51 PM  Result Value Ref Range   Glucose-Capillary 78 70 - 99 mg/dL   Comment 1 Notify RN   Glucose, capillary     Status: None   Collection Time: 12/06/17  5:12 PM  Result Value Ref Range   Glucose-Capillary 86 70 - 99 mg/dL   Comment 1 Capillary Specimen   Basic metabolic panel     Status: Abnormal   Collection Time: 12/06/17  6:44 PM  Result Value Ref Range   Sodium 147 (H) 135 - 145 mmol/L   Potassium 3.2 (L) 3.5 - 5.1 mmol/L   Chloride  117 (H) 98 - 111 mmol/L   CO2 25 22 - 32 mmol/L   Glucose, Bld 100 (H) 70 - 99 mg/dL   BUN 5  (L) 6 - 20 mg/dL   Creatinine, Ser 0.74 0.44 - 1.00 mg/dL   Calcium 7.6 (L) 8.9 - 10.3 mg/dL   GFR calc non Af Amer >60 >60 mL/min   GFR calc Af Amer >60 >60 mL/min    Comment: (NOTE) The eGFR has been calculated using the CKD EPI equation. This calculation has not been validated in all clinical situations. eGFR's persistently <60 mL/min signify possible Chronic Kidney Disease.    Anion gap 5 5 - 15    Comment: Performed at [facility] [address] [place], [place] [address]  Protime-INR     Status: Abnormal   Collection Time: 12/06/17  6:44 PM  Result Value Ref Range   Prothrombin Time 26.3 (H) 11.4 - 15.2 seconds   INR 2.44     Comment: Performed at [facility] [address] [place], [place] [address]  APTT     Status: Abnormal   Collection Time: 12/06/17  6:44 PM  Result Value Ref Range   aPTT 50 (H) 24 - 36 seconds    Comment:        IF BASELINE aPTT IS ELEVATED, SUGGEST PATIENT RISK ASSESSMENT BE USED TO DETERMINE APPROPRIATE ANTICOAGULANT THERAPY. Performed at [facility], [place] [address] [place], [place] [address]   Troponin I     Status: Abnormal   Collection Time: 12/06/17  7:31 PM  Result Value Ref Range   Troponin I 0.69 (HH) <0.03 ng/mL    Comment: CRITICAL RESULT CALLED TO, READ BACK BY AND VERIFIED WITH: J BULLINS,RN AT 2049 12/06/17 BY L BENFIELD Performed at [facility], [place] [address], [place], [place] [address]   Basic metabolic panel     Status: Abnormal   Collection Time: 12/06/17  7:31 PM  Result Value Ref Range   Sodium 146 (H) 135 - 145 mmol/L   Potassium 3.0 (L) 3.5 - 5.1 mmol/L   Chloride 116 (H) 98 - 111 mmol/L   CO2 23 22 - 32 mmol/L   Glucose, Bld 97 70 - 99 mg/dL   BUN 6 6 - 20 mg/dL   Creatinine, Ser 0.79 0.44 - 1.00 mg/dL   Calcium 7.5 (L) 8.9 - 10.3 mg/dL   GFR calc non Af Amer >60 >60 mL/min   GFR calc Af Amer >60 >60 mL/min    Comment: (NOTE) The eGFR has been calculated using the CKD EPI  equation. This calculation has not been validated in all clinical situations. eGFR's persistently <60 mL/min signify possible Chronic Kidney Disease.    Anion gap 7 5 - 15    Comment: Performed at [facility] [address], [place], [place] [address]  Protime-INR now and repeat in 8 hours     Status: Abnormal   Collection Time: 12/06/17  7:31 PM  Result Value Ref Range   Prothrombin Time 26.3 (H) 11.4 - 15.2 seconds   INR 2.44     Comment: Performed at [facility], [place] [address], [place], [place] [address]  APTT now and repeat in 8 hours     Status: Abnormal   Collection Time: 12/06/17  7:31 PM  Result Value Ref Range   aPTT 51 (H) 24 - 36 seconds    Comment:  IF BASELINE aPTT IS ELEVATED, SUGGEST PATIENT RISK ASSESSMENT BE USED TO DETERMINE APPROPRIATE ANTICOAGULANT THERAPY. Performed at [facility], [place] [address] [place], [place] [address]   CBC     Status: Abnormal   Collection Time: 12/06/17  7:31 PM  Result Value Ref Range   WBC 10.2 4.0 - 10.5 K/uL   RBC 3.91 3.87 - 5.11 MIL/uL   Hemoglobin 11.7 (L) 12.0 - 15.0 g/dL   HCT 36.5 36.0 - 46.0 %   MCV 93.4 78.0 - 100.0 fL   MCH 29.9 26.0 - 34.0 pg   MCHC 32.1 30.0 - 36.0 g/dL   RDW 14.2 11.5 - 15.5 %   Platelets 127 (L) 150 - 400 K/uL    Comment: Performed at [facility] [address] [place], [place] [address]  MRSA PCR Screening     Status: None   Collection Time: 12/06/17  7:32 PM  Result Value Ref Range   MRSA by PCR NEGATIVE NEGATIVE    Comment:        The GeneXpert MRSA Assay (FDA approved for NASAL specimens only), is one component of a comprehensive MRSA colonization surveillance program. It is not intended to diagnose MRSA infection nor to guide or monitor treatment for MRSA infections. Performed at [facility], [place] [address], [place], [place] [address]   Glucose, capillary     Status: Abnormal   Collection Time: 12/06/17  8:33 PM  Result Value Ref  Range   Glucose-Capillary 113 (H) 70 - 99 mg/dL  I-STAT 3, arterial blood gas (G3+)     Status: Abnormal   Collection Time: 12/06/17  8:51 PM  Result Value Ref Range   pH, Arterial 7.370 7.350 - 7.450   pCO2 arterial 36.5 32.0 - 48.0 mmHg   pO2, Arterial 108.0 83.0 - 108.0 mmHg   Bicarbonate 21.9 20.0 - 28.0 mmol/L   TCO2 23 22 - 32 mmol/L   O2 Saturation 99.0 %   Acid-base deficit 4.0 (H) 0.0 - 2.0 mmol/L   Patient temperature 33.5 C    Collection site RADIAL, ALLEN'S TEST ACCEPTABLE    Drawn by VP    Sample type ARTERIAL   Glucose, capillary     Status: Abnormal   Collection Time: 12/06/17  9:04 PM  Result Value Ref Range   Glucose-Capillary 112 (H) 70 - 99 mg/dL   Comment 1 Arterial Specimen   I-STAT, chem 8     Status: Abnormal   Collection Time: 12/06/17  9:07 PM  Result Value Ref Range   Sodium 148 (H) 135 - 145 mmol/L   Potassium 2.5 (LL) 3.5 - 5.1 mmol/L   Chloride 112 (H) 98 - 111 mmol/L   BUN 4 (L) 6 - 20 mg/dL   Creatinine, Ser 0.70 0.44 - 1.00 mg/dL   Glucose, Bld 124 (H) 70 - 99 mg/dL   Calcium, Ion 1.17 1.15 - 1.40 mmol/L   TCO2 23 22 - 32 mmol/L   Hemoglobin 11.6 (L) 12.0 - 15.0 g/dL   HCT 34.0 (L) 36.0 - 46.0 %   Comment NOTIFIED PHYSICIAN   Glucose, capillary     Status: Abnormal   Collection Time: 12/06/17 10:01 PM  Result Value Ref Range   Glucose-Capillary 124 (H) 70 - 99 mg/dL   Comment 1 Arterial Specimen   Glucose, capillary     Status: Abnormal   Collection Time: 12/06/17 10:59 PM  Result Value Ref Range   Glucose-Capillary 130 (H) 70 -  99 mg/dL   Comment 1 Arterial Specimen   I-STAT, chem 8     Status: Abnormal   Collection Time: 12/06/17 11:03 PM  Result Value Ref Range   Sodium 146 (H) 135 - 145 mmol/L   Potassium 3.1 (L) 3.5 - 5.1 mmol/L   Chloride 113 (H) 98 - 111 mmol/L   BUN 5 (L) 6 - 20 mg/dL   Creatinine, Ser 0.60 0.44 - 1.00 mg/dL   Glucose, Bld 142 (H) 70 - 99 mg/dL   Calcium, Ion 1.07 (L) 1.15 - 1.40 mmol/L   TCO2 21 (L) 22 -  32 mmol/L   Hemoglobin 11.9 (L) 12.0 - 15.0 g/dL   HCT 35.0 (L) 36.0 - 46.0 %  Glucose, capillary     Status: Abnormal   Collection Time: 12/07/17 12:00 AM  Result Value Ref Range   Glucose-Capillary 135 (H) 70 - 99 mg/dL   Comment 1 Arterial Specimen   Troponin I     Status: Abnormal   Collection Time: 12/07/17 12:46 AM  Result Value Ref Range   Troponin I 0.29 (HH) <0.03 ng/mL    Comment: CRITICAL VALUE NOTED.  VALUE IS CONSISTENT WITH PREVIOUSLY REPORTED AND CALLED VALUE. Performed at [facility], [place] [address] [place], [place] [address]   Glucose, capillary     Status: Abnormal   Collection Time: 12/07/17 12:53 AM  Result Value Ref Range   Glucose-Capillary 134 (H) 70 - 99 mg/dL   Comment 1 Arterial Specimen   I-STAT, chem 8     Status: Abnormal   Collection Time: 12/07/17  1:11 AM  Result Value Ref Range   Sodium 146 (H) 135 - 145 mmol/L   Potassium 3.4 (L) 3.5 - 5.1 mmol/L   Chloride 113 (H) 98 - 111 mmol/L   BUN 5 (L) 6 - 20 mg/dL   Creatinine, Ser 0.60 0.44 - 1.00 mg/dL   Glucose, Bld 141 (H) 70 - 99 mg/dL   Calcium, Ion 1.14 (L) 1.15 - 1.40 mmol/L   TCO2 21 (L) 22 - 32 mmol/L   Hemoglobin 11.6 (L) 12.0 - 15.0 g/dL   HCT 34.0 (L) 36.0 - 46.0 %  Glucose, capillary     Status: Abnormal   Collection Time: 12/07/17  2:10 AM  Result Value Ref Range   Glucose-Capillary 123 (H) 70 - 99 mg/dL  Protime-INR now and repeat in 8 hours     Status: Abnormal   Collection Time: 12/07/17  2:42 AM  Result Value Ref Range   Prothrombin Time 28.6 (H) 11.4 - 15.2 seconds   INR 2.71     Comment: Performed at [facility], [place] [address] [place], [place] [address]  APTT now and repeat in 8 hours     Status: Abnormal   Collection Time: 12/07/17  2:42 AM  Result Value Ref Range   aPTT 58 (H) 24 - 36 seconds    Comment:        IF BASELINE aPTT IS ELEVATED, SUGGEST PATIENT RISK ASSESSMENT BE USED TO DETERMINE APPROPRIATE ANTICOAGULANT THERAPY. Performed at [facility], [place] [address], [place] [address]   CBC     Status: Abnormal   Collection Time: 12/07/17  5:04 AM  Result Value Ref Range   WBC 7.7 4.0 - 10.5 K/uL   RBC 3.98 3.87 - 5.11 MIL/uL   Hemoglobin 11.8 (L) 12.0 - 15.0 g/dL   HCT 36.1 36.0 - 46.0 %   MCV 90.7 78.0 -  100.0 fL   MCH 29.6 26.0 - 34.0 pg   MCHC 32.7 30.0 - 36.0 g/dL   RDW 13.5 11.5 - 15.5 %   Platelets 96 (L) 150 - 400 K/uL    Comment: PLATELET COUNT CONFIRMED BY SMEAR Performed at [facility] [address] [place], [place] [address]   Basic metabolic panel     Status: Abnormal   Collection Time: 12/07/17  5:04 AM  Result Value Ref Range   Sodium 145 135 - 145 mmol/L   Potassium 3.3 (L) 3.5 - 5.1 mmol/L   Chloride 116 (H) 98 - 111 mmol/L   CO2 22 22 - 32 mmol/L   Glucose, Bld 141 (H) 70 - 99 mg/dL   BUN 6 6 - 20 mg/dL   Creatinine, Ser 0.78 0.44 - 1.00 mg/dL   Calcium 7.4 (L) 8.9 - 10.3 mg/dL   GFR calc non Af Amer >60 >60 mL/min   GFR calc Af Amer >60 >60 mL/min    Comment: (NOTE) The eGFR has been calculated using the CKD EPI equation. This calculation has not been validated in all clinical situations. eGFR's persistently <60 mL/min signify possible Chronic Kidney Disease.    Anion gap 7 5 - 15    Comment: Performed at [facility] [address], [place], [place] [address]  I-STAT 3, venous blood gas (G3P V)     Status: Abnormal   Collection Time: 12/07/17  5:23 AM  Result Value Ref Range   pH, Ven 7.517 (H) 7.250 - 7.430   pCO2, Ven 21.9 (L) 44.0 - 60.0 mmHg   pO2, Ven 21.0 (LL) 32.0 - 45.0 mmHg   Bicarbonate 18.4 (L) 20.0 - 28.0 mmol/L   TCO2 19 (L) 22 - 32 mmol/L   O2 Saturation 59.0 %   Acid-base deficit 4.0 (H) 0.0 - 2.0 mmol/L   Patient temperature 33.0 C    Sample type VENOUS    Comment VALUES EXPECTED, NO REPEAT   Glucose, capillary     Status: Abnormal   Collection Time: 12/07/17  6:06 AM  Result Value Ref Range   Glucose-Capillary 139 (H) 70 - 99 mg/dL    Comment 1 Arterial Specimen   Troponin I     Status: Abnormal   Collection Time: 12/07/17  6:44 AM  Result Value Ref Range   Troponin I 0.22 (HH) <0.03 ng/mL    Comment: CRITICAL VALUE NOTED.  VALUE IS CONSISTENT WITH PREVIOUSLY REPORTED AND CALLED VALUE. Performed at [facility], [place] [address], [place], [place] [address]   Glucose, capillary     Status: Abnormal   Collection Time: 12/07/17  7:47 AM  Result Value Ref Range   Glucose-Capillary 120 (H) 70 - 99 mg/dL   Comment 1 Capillary Specimen    Comment 2 Notify RN   Basic metabolic panel     Status: Abnormal   Collection Time: 12/07/17  8:19 AM  Result Value Ref Range   Sodium 148 (H) 135 - 145 mmol/L   Potassium 3.4 (L) 3.5 - 5.1 mmol/L   Chloride 120 (H) 98 - 111 mmol/L   CO2 19 (L) 22 - 32 mmol/L   Glucose, Bld 151 (H) 70 - 99 mg/dL   BUN 5 (L) 6 - 20 mg/dL   Creatinine, Ser 0.72 0.44 - 1.00 mg/dL   Calcium 7.6 (L) 8.9 - 10.3 mg/dL   GFR calc non Af Amer >60 >60 mL/min   GFR calc Af Amer >60 >60  mL/min    Comment: (NOTE) The eGFR has been calculated using the CKD EPI equation. This calculation has not been validated in all clinical situations. eGFR's persistently <60 mL/min signify possible Chronic Kidney Disease.    Anion gap 9 5 - 15    Comment: Performed at [facility] [address] [place], [place] [address]  Glucose, capillary     Status: Abnormal   Collection Time: 12/07/17  8:23 AM  Result Value Ref Range   Glucose-Capillary 106 (H) 70 - 99 mg/dL   Comment 1 Arterial Specimen   Glucose, capillary     Status: Abnormal   Collection Time: 12/07/17 10:12 AM  Result Value Ref Range   Glucose-Capillary 146 (H) 70 - 99 mg/dL   Comment 1 Arterial Specimen    Comment 2 Notify RN   I-STAT 3, arterial blood gas (G3+)     Status: Abnormal   Collection Time: 12/07/17 10:16 AM  Result Value Ref Range   pH, Arterial 7.543 (H) 7.350 - 7.450   pCO2 arterial 21.0 (L) 32.0 - 48.0 mmHg   pO2, Arterial  130.0 (H) 83.0 - 108.0 mmHg   Bicarbonate 18.7 (L) 20.0 - 28.0 mmol/L   TCO2 19 (L) 22 - 32 mmol/L   O2 Saturation 100.0 %   Acid-base deficit 4.0 (H) 0.0 - 2.0 mmol/L   Patient temperature 33.1 C    Sample type ARTERIAL   Glucose, capillary     Status: Abnormal   Collection Time: 12/07/17 11:35 AM  Result Value Ref Range   Glucose-Capillary 116 (H) 70 - 99 mg/dL   Comment 1 Capillary Specimen    Comment 2 Notify RN   Troponin I     Status: Abnormal   Collection Time: 12/07/17 12:13 PM  Result Value Ref Range   Troponin I 0.17 (HH) <0.03 ng/mL    Comment: CRITICAL VALUE NOTED.  VALUE IS CONSISTENT WITH PREVIOUSLY REPORTED AND CALLED VALUE. Performed at [facility], [place] [address], [place], [place] [address]   Basic metabolic panel     Status: Abnormal   Collection Time: 12/07/17 12:13 PM  Result Value Ref Range   Sodium 147 (H) 135 - 145 mmol/L   Potassium 3.9 3.5 - 5.1 mmol/L   Chloride 120 (H) 98 - 111 mmol/L   CO2 20 (L) 22 - 32 mmol/L   Glucose, Bld 154 (H) 70 - 99 mg/dL   BUN 5 (L) 6 - 20 mg/dL   Creatinine, Ser 0.73 0.44 - 1.00 mg/dL   Calcium 7.5 (L) 8.9 - 10.3 mg/dL   GFR calc non Af Amer >60 >60 mL/min   GFR calc Af Amer >60 >60 mL/min    Comment: (NOTE) The eGFR has been calculated using the CKD EPI equation. This calculation has not been validated in all clinical situations. eGFR's persistently <60 mL/min signify possible Chronic Kidney Disease.    Anion gap 7 5 - 15    Comment: Performed at [facility] [address] [place], [place] [address]  Glucose, capillary     Status: Abnormal   Collection Time: 12/07/17  2:52 PM  Result Value Ref Range   Glucose-Capillary 114 (H) 70 - 99 mg/dL  Glucose, capillary     Status: Abnormal   Collection Time: 12/07/17  3:59 PM  Result Value Ref Range   Glucose-Capillary 112 (H) 70 - 99 mg/dL   Comment 1 Notify RN   Basic metabolic panel     Status:  Abnormal   Collection Time: 12/07/17  4:56 PM  Result  Value Ref Range   Sodium 147 (H) 135 - 145 mmol/L   Potassium 3.8 3.5 - 5.1 mmol/L   Chloride 118 (H) 98 - 111 mmol/L   CO2 22 22 - 32 mmol/L   Glucose, Bld 125 (H) 70 - 99 mg/dL   BUN <5 (L) 6 - 20 mg/dL   Creatinine, Ser 0.78 0.44 - 1.00 mg/dL   Calcium 7.3 (L) 8.9 - 10.3 mg/dL   GFR calc non Af Amer >60 >60 mL/min   GFR calc Af Amer >60 >60 mL/min    Comment: (NOTE) The eGFR has been calculated using the CKD EPI equation. This calculation has not been validated in all clinical situations. eGFR's persistently <60 mL/min signify possible Chronic Kidney Disease.    Anion gap 7 5 - 15    Comment: Performed at [facility] [address] [place], [place] [address]  Glucose, capillary     Status: Abnormal   Collection Time: 12/07/17  6:51 PM  Result Value Ref Range   Glucose-Capillary 104 (H) 70 - 99 mg/dL  Glucose, capillary     Status: Abnormal   Collection Time: 12/07/17  7:51 PM  Result Value Ref Range   Glucose-Capillary 106 (H) 70 - 99 mg/dL   Comment 1 Arterial Specimen   Basic metabolic panel     Status: Abnormal   Collection Time: 12/07/17  8:54 PM  Result Value Ref Range   Sodium 141 135 - 145 mmol/L   Potassium 3.5 3.5 - 5.1 mmol/L   Chloride 117 (H) 98 - 111 mmol/L   CO2 20 (L) 22 - 32 mmol/L   Glucose, Bld 125 (H) 70 - 99 mg/dL   BUN 5 (L) 6 - 20 mg/dL   Creatinine, Ser 0.69 0.44 - 1.00 mg/dL   Calcium 7.3 (L) 8.9 - 10.3 mg/dL   GFR calc non Af Amer >60 >60 mL/min   GFR calc Af Amer >60 >60 mL/min    Comment: (NOTE) The eGFR has been calculated using the CKD EPI equation. This calculation has not been validated in all clinical situations. eGFR's persistently <60 mL/min signify possible Chronic Kidney Disease.    Anion gap 4 (L) 5 - 15    Comment: Performed at [facility] [address], [place], [place] [address]  Basic metabolic panel     Status: Abnormal   Collection Time: 12/08/17 12:55 AM  Result Value Ref Range   Sodium 144 135 - 145  mmol/L   Potassium 3.7 3.5 - 5.1 mmol/L   Chloride 114 (H) 98 - 111 mmol/L   CO2 21 (L) 22 - 32 mmol/L   Glucose, Bld 134 (H) 70 - 99 mg/dL   BUN <5 (L) 6 - 20 mg/dL   Creatinine, Ser 0.81 0.44 - 1.00 mg/dL   Calcium 7.4 (L) 8.9 - 10.3 mg/dL   GFR calc non Af Amer >60 >60 mL/min   GFR calc Af Amer >60 >60 mL/min    Comment: (NOTE) The eGFR has been calculated using the CKD EPI equation. This calculation has not been validated in all clinical situations. eGFR's persistently <60 mL/min signify possible Chronic Kidney Disease.    Anion gap 9 5 - 15    Comment: Performed at [facility] [address] [place], [place] [address]  Glucose, capillary     Status: Abnormal   Collection Time: 12/08/17  4:08 AM  Result  Value Ref Range   Glucose-Capillary 117 (H) 70 - 99 mg/dL   Comment 1 Arterial Specimen   Basic metabolic panel     Status: Abnormal   Collection Time: 12/08/17  5:05 AM  Result Value Ref Range   Sodium 145 135 - 145 mmol/L   Potassium 3.7 3.5 - 5.1 mmol/L   Chloride 116 (H) 98 - 111 mmol/L   CO2 24 22 - 32 mmol/L   Glucose, Bld 134 (H) 70 - 99 mg/dL   BUN <5 (L) 6 - 20 mg/dL   Creatinine, Ser 0.75 0.44 - 1.00 mg/dL   Calcium 7.6 (L) 8.9 - 10.3 mg/dL   GFR calc non Af Amer >60 >60 mL/min   GFR calc Af Amer >60 >60 mL/min    Comment: (NOTE) The eGFR has been calculated using the CKD EPI equation. This calculation has not been validated in all clinical situations. eGFR's persistently <60 mL/min signify possible Chronic Kidney Disease.    Anion gap 5 5 - 15    Comment: Performed at [facility] [address] [place], [place] [address]    Recent Results (from the past 240 hour(s))  Blood culture (routine x 2)     Status: None   Collection Time: 11/18/2017  3:15 AM  Result Value Ref Range Status   Specimen Description BLOOD RIGHT HAND  Final   Special Requests   Final    BOTTLES DRAWN AEROBIC AND ANAEROBIC Blood Culture results may not be optimal due  to an excessive volume of blood received in culture bottles   Culture   Final    NO GROWTH 5 DAYS Performed at [facility], [place] [address], [place], [place] [address]    Report Status 12/08/2017 FINAL  Final  Blood culture (routine x 2)     Status: None   Collection Time: 12/15/2017  3:35 AM  Result Value Ref Range Status   Specimen Description SITE NOT SPECIFIED  Final   Special Requests   Final    BOTTLES DRAWN AEROBIC AND ANAEROBIC Blood Culture adequate volume   Culture   Final    NO GROWTH 5 DAYS Performed at [facility], [address] [place], [place] [address]    Report Status 12/08/2017 FINAL  Final  Urine culture     Status: Abnormal   Collection Time: 12/04/2017  4:13 AM  Result Value Ref Range Status   Specimen Description URINE, CATHETERIZED  Final   Special Requests   Final    NONE Performed at [facility], [place] [address], [place], [place] [address]    Culture MULTIPLE SPECIES PRESENT, SUGGEST RECOLLECTION (A)  Final   Report Status 12/04/2017 FINAL  Final  MRSA PCR Screening     Status: None   Collection Time: 12/04/17  5:52 PM  Result Value Ref Range Status   MRSA by PCR NEGATIVE NEGATIVE Final    Comment:        The GeneXpert MRSA Assay (FDA approved for NASAL specimens only), is one component of a comprehensive MRSA colonization surveillance program. It is not intended to diagnose MRSA infection nor to guide or monitor treatment for MRSA infections. Performed at [facility], [place] [address] [place],  [address]   Culture, respiratory (non-expectorated)     Status: None (Preliminary result)   Collection Time: 12/06/17  2:45 PM  Result Value Ref Range Status   Specimen Description TRACHEAL ASPIRATE  Final   Special  Requests Normal  Final   Gram Stain   Final    FEW WBC PRESENT, PREDOMINANTLY PMN NO ORGANISMS SEEN    Culture   Final    CULTURE REINCUBATED FOR BETTER GROWTH Performed at [facility], [place]  [address], [place], [place] [address]    Report Status PENDING  Incomplete  MRSA PCR Screening     Status: None   Collection Time: 12/06/17  7:32 PM  Result Value Ref Range Status   MRSA by PCR NEGATIVE NEGATIVE Final    Comment:        The GeneXpert MRSA Assay (FDA approved for NASAL specimens only), is one component of a comprehensive MRSA colonization surveillance program. It is not intended to diagnose MRSA infection nor to guide or monitor treatment for MRSA infections. Performed at [facility], [place] [address] [place], [place] [address]     Lipid Panel No results for input(s): CHOL, TRIG, HDL, CHOLHDL, VLDL, LDLCALC in the last 72 hours.  Studies/Results: Dg Chest 1 View  Result Date: 12/06/2017 CLINICAL DATA:  Difficult intubation EXAM: CHEST  1 VIEW COMPARISON:  12/06/2017 FINDINGS: Endotracheal tube 2.2 cm above the carina. NG tube extends to the stomach. Right subclavian central line tip at the mid SVC level. Mediastinal drains noted. Defibrillator pads overlie the chest. Contrast within the bowel in the epigastric region. Low lung volumes persist. There is improved aeration of the left lung following retraction of the endotracheal tube. Scattered airspace disease and atelectasis remains. Left effusion not excluded. No pneumothorax. IMPRESSION: Retraction of endotracheal tube, now 2.2 cm above the carina. Improved aeration of the left lung Persistent low lung volumes with scattered airspace disease and atelectasis Suspect left pleural effusion layering posteriorly No large pneumothorax Electronically Signed   By: Jerilynn Mages.  Shick M.D.   On: 12/06/2017 10:56   Ct Head Wo Contrast  Result Date: 12/06/2017 CLINICAL DATA:  Seizures.  Cerebral palsy. EXAM: CT HEAD WITHOUT CONTRAST TECHNIQUE: Contiguous axial images were obtained from the base of the skull through the vertex without intravenous contrast. COMPARISON:  None. FINDINGS: Brain: Cerebral hemispheres are constrained by an  abnormal scaphocephalic cranial vault. There is premature atrophy for age. The ventricles are decompressed by a LEFT occipital shunt catheter. Hypoplastic brainstem and cerebellum. Absent corpus callosum. Vascular: No hyperdense vessel. Skull: Marked scaphocephaly, query premature closure of the sagittal suture. Large burr hole placement in the LEFT occipital bone for the ventriculoperitoneal shunt catheter. No worrisome osseous lesion or fracture. Thickened calvarium, likely related to anticonvulsant use. Sinuses/Orbits: No acute finding. Other: No middle ear or mastoid fluid. The patient appears to be endotracheally intubated. IMPRESSION: Cerebral, cerebellar, and brainstem atrophy, likely congenital or longstanding. 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 Scaphocephalic skull constrains the cerebral hemispheres, likely premature fusion of the sagittal suture, but there is no fracture or worrisome osseous lesion. Ventricles are decompressed by a LEFT occipital shunt catheter. 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 Electronically Signed   By: Staci Righter M.D.   On: 12/06/2017 17:05   Ct Angio Chest Pe W Or Wo Contrast  Result Date: 12/06/2017 CLINICAL DATA:  Status post cardiac arrest. History of seizures and cerebral palsy. EXAM: CT ANGIOGRAPHY CHEST CT ABDOMEN AND PELVIS WITH CONTRAST TECHNIQUE: Multidetector CT imaging of the chest was performed using the standard protocol during bolus administration of intravenous contrast. Multiplanar CT image reconstructions and MIPs were obtained to  evaluate the vascular anatomy. Multidetector CT imaging of the abdomen and pelvis was performed using the standard protocol during bolus administration of intravenous contrast. CONTRAST:  41m ISOVUE-370 IOPAMIDOL (ISOVUE-370) INJECTION 76% COMPARISON:  Abdominopelvic CT 12/04/2017 and 12/02/2017. Chest radiographs today. FINDINGS: CTA CHEST  FINDINGS Cardiovascular: The pulmonary arteries are well opacified with contrast to the level of the subsegmental branches. There is no evidence of acute pulmonary embolism. The thoracic aorta and great vessels appear normal. Right arm PICC extends to the mid SVC. The heart size is normal. There is no pericardial effusion. Mediastinum/Nodes: There are no enlarged mediastinal, hilar or axillary lymph nodes. There is no evidence of mediastinal hematoma. Endotracheal and enteric tubes are in place. Lungs/Pleura: There are moderate size dependent pleural effusions bilaterally there is no pneumothorax. Dependent airspace opacities at both lung bases likely represent atelectasis. There are additional patchy airspace opacities in both upper lobes, likely pneumonia or hemorrhage. No lung mass or endobronchial lesion identified. Musculoskeletal/Chest wall: Generalized edema throughout the chest wall. No soft tissue mass or acute osseous findings are demonstrated. Review of the MIP images confirms the above findings. CT ABDOMEN AND PELVIS FINDINGS Hepatobiliary: The liver has a stable appearance without intrinsic abnormality. No evidence of gallstones, gallbladder wall thickening or biliary dilatation. Pancreas: Unremarkable. No pancreatic ductal dilatation or surrounding inflammatory changes. Spleen: Normal in size without focal abnormality. Adrenals/Urinary Tract: Both adrenal glands appear normal. No focal renal abnormalities are identified. There is no evidence of urinary tract calculus or hydronephrosis. The bladder appears normal. Stomach/Bowel: Enteric tube extends into the proximal stomach. There is a percutaneous gastrojejunostomy which extends into the proximal jejunum. The stomach remains decompressed. There is no significant residual small or large bowel distension. There is mild diffuse small bowel wall thickening. Multiple extraluminal air bubbles are again noted within the upper abdomen, surrounding the stomach  and gastrohepatic ligament. These have slightly decreased in overall volume over the last 2 days. There is no extravasated enteric contrast. Vascular/Lymphatic: There are no enlarged abdominal or pelvic lymph nodes. No significant vascular findings. Reproductive: The uterus and ovaries appear normal. No adnexal mass. Other: Again demonstrated is moderate ascites. There is increased fluid in the right upper quadrant, inferior to the liver and gallbladder, measuring up to 11.0 x 4.0 cm on image 20/5. Other interloop and pelvic components of this ascites have not significantly changed. There are no well-defined or peripherally enhancing fluid collections. Ventricular peritoneal shunt tubing extends into the right pelvis. Musculoskeletal: No acute osseous findings. Thoracolumbar scoliosis, postsurgical changes in the left bony pelvis and proximal femurs again noted. There is generalized edema throughout the subcutaneous fat. Review of the MIP images confirms the above findings. IMPRESSION: 1. No evidence of acute pulmonary embolism. 2. Support system appears adequately positioned within the chest. 3. Moderate dependent bilateral pleural effusions with associated bibasilar atelectasis. Additional patchy airspace opacities in both upper lobes, suspicious for pneumonia or pulmonary hemorrhage. 4. Extraluminal gas in the upper abdomen, surrounding the stomach and gastrohepatic ligament has mildly improved. No extravasated enteric contrast is identified. 5. The volume of ascites has mildly increased, especially inferior to the liver. No well-defined or peripherally enhancing intra-abdominal fluid collections are seen to suggest abscess. 6. Resolved small bowel distension consistent with improving ileus. Electronically Signed   By: WRichardean SaleM.D.   On: 12/06/2017 14:21   Ct Abdomen Pelvis W Contrast  Result Date: 12/06/2017 CLINICAL DATA:  Status post cardiac arrest. History of seizures and cerebral palsy. EXAM: CT  ANGIOGRAPHY  CHEST CT ABDOMEN AND PELVIS WITH CONTRAST TECHNIQUE: Multidetector CT imaging of the chest was performed using the standard protocol during bolus administration of intravenous contrast. Multiplanar CT image reconstructions and MIPs were obtained to evaluate the vascular anatomy. Multidetector CT imaging of the abdomen and pelvis was performed using the standard protocol during bolus administration of intravenous contrast. CONTRAST:  31m ISOVUE-370 IOPAMIDOL (ISOVUE-370) INJECTION 76% COMPARISON:  Abdominopelvic CT 12/04/2017 and 11/26/2017. Chest radiographs today. FINDINGS: CTA CHEST FINDINGS Cardiovascular: The pulmonary arteries are well opacified with contrast to the level of the subsegmental branches. There is no evidence of acute pulmonary embolism. The thoracic aorta and great vessels appear normal. Right arm PICC extends to the mid SVC. The heart size is normal. There is no pericardial effusion. Mediastinum/Nodes: There are no enlarged mediastinal, hilar or axillary lymph nodes. There is no evidence of mediastinal hematoma. Endotracheal and enteric tubes are in place. Lungs/Pleura: There are moderate size dependent pleural effusions bilaterally there is no pneumothorax. Dependent airspace opacities at both lung bases likely represent atelectasis. There are additional patchy airspace opacities in both upper lobes, likely pneumonia or hemorrhage. No lung mass or endobronchial lesion identified. Musculoskeletal/Chest wall: Generalized edema throughout the chest wall. No soft tissue mass or acute osseous findings are demonstrated. Review of the MIP images confirms the above findings. CT ABDOMEN AND PELVIS FINDINGS Hepatobiliary: The liver has a stable appearance without intrinsic abnormality. No evidence of gallstones, gallbladder wall thickening or biliary dilatation. Pancreas: Unremarkable. No pancreatic ductal dilatation or surrounding inflammatory changes. Spleen: Normal in size without focal  abnormality. Adrenals/Urinary Tract: Both adrenal glands appear normal. No focal renal abnormalities are identified. There is no evidence of urinary tract calculus or hydronephrosis. The bladder appears normal. Stomach/Bowel: Enteric tube extends into the proximal stomach. There is a percutaneous gastrojejunostomy which extends into the proximal jejunum. The stomach remains decompressed. There is no significant residual small or large bowel distension. There is mild diffuse small bowel wall thickening. Multiple extraluminal air bubbles are again noted within the upper abdomen, surrounding the stomach and gastrohepatic ligament. These have slightly decreased in overall volume over the last 2 days. There is no extravasated enteric contrast. Vascular/Lymphatic: There are no enlarged abdominal or pelvic lymph nodes. No significant vascular findings. Reproductive: The uterus and ovaries appear normal. No adnexal mass. Other: Again demonstrated is moderate ascites. There is increased fluid in the right upper quadrant, inferior to the liver and gallbladder, measuring up to 11.0 x 4.0 cm on image 20/5. Other interloop and pelvic components of this ascites have not significantly changed. There are no well-defined or peripherally enhancing fluid collections. Ventricular peritoneal shunt tubing extends into the right pelvis. Musculoskeletal: No acute osseous findings. Thoracolumbar scoliosis, postsurgical changes in the left bony pelvis and proximal femurs again noted. There is generalized edema throughout the subcutaneous fat. Review of the MIP images confirms the above findings. IMPRESSION: 1. No evidence of acute pulmonary embolism. 2. Support system appears adequately positioned within the chest. 3. Moderate dependent bilateral pleural effusions with associated bibasilar atelectasis. Additional patchy airspace opacities in both upper lobes, suspicious for pneumonia or pulmonary hemorrhage. 4. Extraluminal gas in the upper  abdomen, surrounding the stomach and gastrohepatic ligament has mildly improved. No extravasated enteric contrast is identified. 5. The volume of ascites has mildly increased, especially inferior to the liver. No well-defined or peripherally enhancing intra-abdominal fluid collections are seen to suggest abscess. 6. Resolved small bowel distension consistent with improving ileus. Electronically Signed   By: WGwyndolyn Saxon  Lin Landsman M.D.   On: 12/06/2017 14:21   Dg Chest Port 1 View  Result Date: 12/07/2017 CLINICAL DATA:  Respiratory failure EXAM: PORTABLE CHEST 1 VIEW COMPARISON:  CT chest dated 12/06/2017 FINDINGS: Low lung volumes. Mild patchy opacities in the right upper lobe and bilateral lower lobes, better evaluated on prior CT. Small bilateral pleural effusions. No pneumothorax. Endotracheal tube terminates 10 mm above the carina. Enteric tube courses into the stomach. Defibrillator pads overlying the left hemithorax. Contrast within colon in the upper abdomen. IMPRESSION: Low lung volumes. Multifocal patchy opacities, better evaluated on recent CT. Small bilateral pleural effusions. Endotracheal tube terminates 10 mm above the carina. Enteric tube courses into the stomach. Electronically Signed   By: Julian Hy M.D.   On: 12/07/2017 07:38   Dg Chest Port 1 View  Result Date: 12/06/2017 CLINICAL DATA:  Endotracheal tube placement. EXAM: PORTABLE CHEST 1 VIEW COMPARISON:  Radiographs 12/04/2017 and 04/24/2017. Abdominal CT 12/04/2017. FINDINGS: 1025 hours. Endotracheal tube extends into the right mainstem bronchus and should be retracted approximately 4 cm. Enteric tube projects below the diaphragm, tip not visualized. Ventricular peritoneal shunt catheter overlies the chest and upper abdomen. There is a right subclavian central venous catheter projecting to the level of the lower SVC. There is complete collapse of the left lung. There is mild perihilar atelectasis on the right. No significant pleural  effusion or pneumothorax. IMPRESSION: 1. Right mainstem bronchus intubation with resulting complete left lung collapse. The endotracheal tube should be withdrawn approximately 4 cm. 2. Central line and enteric tube appear adequately positioned. 3. These results were called by telephone at the time of interpretation on 12/06/2017 at 10:55 am to the patient's nurse Marlowe Kays, who verbally acknowledged these results. Transmission of results delayed by transfer of patient to a different ward and 3 telephone calls required before phone was answered. Electronically Signed   By: Richardean Sale M.D.   On: 12/06/2017 10:58    Medications:  Scheduled: . artificial tears  1 application Both Eyes H0W  . baclofen  10 mg Per Tube QHS  . baclofen  15 mg Per Tube Q1400  . baclofen  20 mg Per Tube Daily  . carBAMazepine  220 mg Per Tube BID   And  . carBAMazepine  240 mg Per Tube Daily  . chlorhexidine gluconate (MEDLINE KIT)  15 mL Mouth Rinse BID  . Chlorhexidine Gluconate Cloth  6 each Topical Q0600  . fentaNYL (SUBLIMAZE) injection  100 mcg Intravenous Once  . heparin  5,000 Units Subcutaneous Q8H  . iohexol  75 mL Intravenous Once  . levETIRAcetam  500 mg Per Tube Daily  . levETIRAcetam  750 mg Per Tube QHS  . mouth rinse  15 mL Mouth Rinse 10 times per day  . midazolam  2 mg Intravenous Once  . pantoprazole sodium  40 mg Per Tube Daily  . PHENObarbital  50 mg Per Tube BID  . sodium chloride flush  10-40 mL Intracatheter Q12H   Continuous: . sodium chloride 10 mL/hr at 12/08/17 0800  . fentaNYL infusion INTRAVENOUS 50 mcg/hr (12/08/17 0800)  . lactated ringers Stopped (12/08/17 0754)  . midazolam (VERSED) infusion 1 mg/hr (12/08/17 0800)  . norepinephrine (LEVOPHED) Adult infusion 12 mcg/min (12/08/17 0800)  . piperacillin-tazobactam (ZOSYN)  IV 12.5 mL/hr at 12/08/17 0800    Assessment: 27 year old female with severe static encephalopathy, cerebral palsy with severe spastic quadriparesis,  cortical blindness and seizure disorder, status post cardiac arrest.  1. EEG off sedation  on 9/21 showed burst suppression pattern, suggestive of diffuse anoxic brain injury.  2. Currently on light sedation with 1 mg/hr Versed.  3. Currently being rewarmed with projected time to be fully rewarmed: Noon today 4. Current anticonvulsant regimen: Carbamazepine, Keppra and phenobarbital.  Recommendations:  1. Repeat EEG in the afternoon after fully rewarmed (ordered) 2. Continue supportive care.  3. Continue carbamazepine, Keppra and phenobarbital  35 minutes spent in the neurological evaluation and management of this critically ill patient   LOS: 5 days   _0  signed: Dr. Kerney Elbe 12/08/2017  8:21 AM

## 2017-12-08 NOTE — Plan of Care (Signed)
  Problem: Clinical Measurements: Goal: Will remain free from infection Outcome: Progressing Goal: Diagnostic test results will improve Outcome: Progressing Goal: Respiratory complications will improve Outcome: Progressing Goal: Cardiovascular complication will be avoided Outcome: Progressing   Problem: Pain Managment: Goal: General experience of comfort will improve Outcome: Progressing   Problem: Safety: Goal: Ability to remain free from injury will improve Outcome: Progressing   Problem: Skin Integrity: Goal: Risk for impaired skin integrity will decrease Outcome: Progressing   

## 2017-12-08 NOTE — Progress Notes (Addendum)
NAME:  AIKA BRZOSKA, MRN:  [id number], DOB:  11/14/1990, LOS: 5 ADMISSION DATE:  12/10/2017, CONSULTATION DATE:  12/06/17 REFERRING MD:  Hospitalist CHIEF COMPLAINT:  Cardiac arrest   Brief History   27 y.o female, s/p cardiac arrest on the floor, transferred to the ICU. Intubated during ACLS protocol. ROSC achieved. Significant Hospital Events   From the admitting h/p on 12/15/2017: This is a 27 year-old female with cerebral palsy complicated by intellectual disability, severe spastic quadriparesis, cortical blindness, dysphagia with GJT, and seizure disorder who presents for evaluation of fevers x1 day.  Patient was in her usual state of health until after returning home from routine GJ tube exchange 9/17 when her mother noticed the patient was diaphoretic and not acting like her usual self.   She was admitted with presumptive diagnosis of sepsis due to aspiration  CT abdomen/pelvis showed consolidation vs atelectasis in right lung base, dilated and fluid-filled esophagus suggesting reflux or dysmotility and GJT appeared to be in appropriate position. Also noted was mild wall-thickening of sigmoid colon and while no obvious bowel perforation was appreciated, repeat study with water-soluble bowel contrast was suspicious for localized contained perforation in the mid-abdomen. General surgery was consulted for evaluation.  She sustained cardiac arrest on 9/21 and underwent therapeutic hypothermia  Consults: date of consult/date signed off & final recs:   Procedures (surgical and bedside):  12/06/17: intubation, cpr Significant Diagnostic Tests:  12/12/2017, 19/19/19: CT abdomen 12/07/07- eeg, CT head 12/06/17- CTA, CT abdomen  Micro Data: 11/23/2017: urine and blood cultures pending 11/24/2017: MRSA PCR negative Antimicrobials:  12/05/17: Zosyn  Subjective:   Remains critically ill, unresponsive, Levophed. She is now being rewarmed currently at 36 degrees. She is sedated on low-dose Versed  and fentanyl drips  Objective   Blood pressure 96/73, pulse (!) 104, temperature (!) 96.4 F (35.8 C), temperature source Bladder, resp. rate 20, weight 60.5 kg, SpO2 99 %. CVP:  [10 mmHg-19 mmHg] 16 mmHg  Vent Mode: PRVC FiO2 (%):  [40 %] 40 % Set Rate:  [12 bmp-18 bmp] 12 bmp Vt Set:  [350 mL] 350 mL PEEP:  [5 cmH20] 5 cmH20 Plateau Pressure:  [18 cmH20-23 cmH20] 23 cmH20   Intake/Output Summary (Last 24 hours) at 12/08/2017 0854 Last data filed at 12/08/2017 0800 Gross per 24 hour  Intake 2468.66 ml  Output 770 ml  Net 1698.66 ml   Filed Weights   11/17/2017 1009 12/07/17 0215 12/08/17 0600  Weight: 53.5 kg 60.4 kg 60.5 kg    Examination: Gen. Critically ill, in no distress, sedated ENT - no lesions, no post nasal drip Neck: No JVD, no thyromegaly, no carotid bruits Lungs: no use of accessory muscles, no dullness to percussion, decreased BS BL without rales or rhonchi  Cardiovascular: Rhythm regular, heart sounds  normal, no murmurs or gallops, no peripheral edema Abdomen: soft and non-tender, no hepatosplenomegaly, BS normal. 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  -Repeat EEG planned by neuro  Aspiration pna, MRSA pcr neg  - ct zosyn, plan 5-7 ds  Dysphagia, GJT-change 9/17 Suspicion for contained perforation -Okay to start trickle feeds per surgery, will hold off until goals of care  discussion 9/24  Hypokalemia -replete potassium as required -Surgery recommends hold enteral feedings until 12/07/17. Will begin tonight.     Disposition / Summary of Today's Plan 12/08/17   Await neuro prognostication based on repeat EEG.  May also need to wait further for neuro exam off sedation.  Goals of care will be discussed with mother based on neuro prognosis     Code Status: full Family Communication: Goals of care discussion with mother plan for 9/24  Labs   CBC: Recent Labs  Lab 2017/12/22 0315  12/05/17 0500 12/06/17 0549 12/06/17 1112 12/06/17 1931 12/06/17 2107 12/06/17 2303 12/07/17 0111 12/07/17 0504  WBC 4.0   < > 9.5 8.6 10.4 10.2  --   --   --  7.7  NEUTROABS 3.2  --   --   --   --   --   --   --   --   --   HGB 17.3*   < > 12.6 12.5 12.3 11.7* 11.6* 11.9* 11.6* 11.8*  HCT 53.3*   < > 39.6 41.2 39.6 36.5 34.0* 35.0* 34.0* 36.1  MCV 91.3   < > 94.5 97.9 96.8 93.4  --   --   --  90.7  PLT 302   < > 122* 175 133* 127*  --   --   --  96*   < > = values in this interval not displayed.   Basic Metabolic Panel: Recent Labs  Lab 12/07/17 1213 12/07/17 1656 12/07/17 2054 12/08/17 0055 12/08/17 0505  NA 147* 147* 141 144 145  K 3.9 3.8 3.5 3.7 3.7  CL 120* 118* 117* 114* 116*  CO2 20* 22 20* 21* 24  GLUCOSE 154* 125* 125* 134* 134*  BUN 5* <5* 5* <5* <5*  CREATININE 0.73 0.78 0.69 0.81 0.75  CALCIUM 7.5* 7.3* 7.3* 7.4* 7.6*   GFR: CrCl cannot be calculated (Unknown ideal weight.). Recent Labs  Lab 12/04/17 0014 12/04/17 0746  12/05/17 0730 12/06/17 0549 12/06/17 0719 12/06/17 1112 12/06/17 1931 12/07/17 0504  WBC 4.8  --    < >  --  8.6  --  10.4 10.2 7.7  LATICACIDVEN 3.6* 2.3*  --  2.0*  --  1.7  --   --   --    < > = values in this interval not displayed.   Liver Function Tests: Recent Labs  Lab 12/22/17 0315 12/04/17 0014 12/06/17 1112  AST 43* 25 27  ALT 49* 32 26  ALKPHOS 65 38 98  BILITOT 0.7 0.4 1.0  PROT 6.1* 4.5* 4.6*    ALBUMIN 2.9* 2.0* 1.9*   No results for input(s): LIPASE, AMYLASE in the last 168 hours. No results for input(s): AMMONIA in the last 168 hours. ABG    Component Value Date/Time   PHART 7.543 (H) 12/07/2017 1016   PCO2ART 21.0 (L) 12/07/2017 1016   PO2ART 130.0 (H) 12/07/2017 1016   HCO3 18.7 (L) 12/07/2017 1016   TCO2 19 (L) 12/07/2017 1016   ACIDBASEDEF 4.0 (H) 12/07/2017 1016   O2SAT 100.0 12/07/2017 1016    Coagulation Profile: Recent Labs  Lab 12/06/17 1112 12/06/17 1844 12/06/17 1931 12/07/17 0242  INR 2.35 2.44 2.44 2.71   Cardiac Enzymes: Recent Labs  Lab 12/06/17 1931 12/07/17 0046 12/07/17 0644 12/07/17 1213  TROPONINI 0.69* 0.29* 0.22* 0.17*   HbA1C: No results found for: HGBA1C CBG: Recent Labs  Lab 12/07/17 1559 12/07/17 1851 12/07/17 1951 12/08/17 0408 12/08/17 0832  GLUCAP 112* 104* 106* 117* 109*    Admitting History of Present Illness.   See above Review of Systems:   Unavailable  My independent critical care time x 3762m  Cyril Mourningakesh Kelty Szafran MD. FCCP. [facility] Pager [phone] If no response call [phone]   12/08/2017

## 2017-12-08 NOTE — Procedures (Signed)
ELECTROENCEPHALOGRAM REPORT   Patient: Amber Curry       Room #: [id number] EEG No. ID: [id number] Age: 27 y.o.        Sex: female Referring Physician: Vassie LollAlva Report Date:  12/08/2017        Interpreting Physician: Thana FarrEYNOLDS, Zenora Karpel  History: Amber NevinConella S Natzke is an 27 y.o. female with sepsis  Medications:  Baclofen, Tegretol, Keppra, Phenobarbital  Conditions of Recording:  This is a 21 channel routine scalp EEG performed with bipolar and monopolar montages arranged in accordance to the international 10/20 system of electrode placement. One channel was dedicated to EKG recording.  The patient is in the intubated and poorly responsive state.  Description:  The background activity is discontinuous.  It consists of an attenuated background alternating with bursts of moderate to high voltage activity.  This burst activity consists of polymorphic delta activity and intermixed polyspike, spike and slow wave activity.  The bursts are generalized and last up to 3 seconds.  The periods of attenuation are synchronous over both hemispheres and last up to 2 seconds.   Hyperventilation and intermittent photic stimulation were not performed.   IMPRESSION: This is an abnormal electroencephalogram secondary to a burst suppression pattern.     Thana FarrLeslie Mariama Saintvil, MD Neurology [phone] 12/08/2017, 8:24 PM

## 2017-12-09 ENCOUNTER — Inpatient Hospital Stay (HOSPITAL_COMMUNITY): Payer: Medicare Other

## 2017-12-09 DIAGNOSIS — A419 Sepsis, unspecified organism: Principal | ICD-10-CM

## 2017-12-09 DIAGNOSIS — R6520 Severe sepsis without septic shock: Secondary | ICD-10-CM

## 2017-12-09 LAB — BASIC METABOLIC PANEL
Anion gap: 6 (ref 5–15)
BUN: 5 mg/dL — ABNORMAL LOW (ref 6–20)
CALCIUM: 7.4 mg/dL — AB (ref 8.9–10.3)
CO2: 26 mmol/L (ref 22–32)
CREATININE: 0.76 mg/dL (ref 0.44–1.00)
Chloride: 114 mmol/L — ABNORMAL HIGH (ref 98–111)
GFR calc Af Amer: >60 mL/min (ref >60)
GFR calc non Af Amer: >60 mL/min (ref >60)
GLUCOSE: 106 mg/dL — AB (ref 70–99)
Potassium: 3.2 mmol/L — ABNORMAL LOW (ref 3.5–5.1)
Sodium: 146 mmol/L — ABNORMAL HIGH (ref 135–145)

## 2017-12-09 LAB — CBC
HCT: 33 % — ABNORMAL LOW (ref 36.0–46.0)
Hemoglobin: 11.5 g/dL — ABNORMAL LOW (ref 12.0–15.0)
MCH: 29.7 pg (ref 26.0–34.0)
MCHC: 34.8 g/dL (ref 30.0–36.0)
MCV: 85.3 fL (ref 78.0–100.0)
PLATELETS: 107 10*3/uL — AB (ref 150–400)
RBC: 3.87 MIL/uL (ref 3.87–5.11)
RDW: 12.7 % (ref 11.5–15.5)
WBC: 12.9 10*3/uL — ABNORMAL HIGH (ref 4.0–10.5)

## 2017-12-09 LAB — CULTURE, RESPIRATORY W GRAM STAIN: Special Requests: NORMAL

## 2017-12-09 LAB — GLUCOSE, CAPILLARY
GLUCOSE-CAPILLARY: 72 mg/dL (ref 70–99)
Glucose-Capillary: 92 mg/dL (ref 70–99)
Glucose-Capillary: 97 mg/dL (ref 70–99)
Glucose-Capillary: 116 mg/dL — ABNORMAL HIGH (ref 70–99)
Glucose-Capillary: 95 mg/dL (ref 70–99)

## 2017-12-09 LAB — MAGNESIUM: MAGNESIUM: 1.7 mg/dL (ref 1.7–2.4)

## 2017-12-09 LAB — PHOSPHORUS: Phosphorus: 1.1 mg/dL — ABNORMAL LOW (ref 2.5–4.6)

## 2017-12-09 MED ORDER — POTASSIUM CHLORIDE 20 MEQ/15ML (10%) PO SOLN
30.0000 meq | ORAL | Status: AC
  Administered 2017-12-09 (×2): 30 meq
  Filled 2017-12-09 (×2): qty 30

## 2017-12-09 MED ORDER — POTASSIUM PHOSPHATES 15 MMOLE/5ML IV SOLN
30.0000 mmol | Freq: Once | INTRAVENOUS | Status: AC
  Administered 2017-12-09: 30 mmol via INTRAVENOUS
  Filled 2017-12-09: qty 10

## 2017-12-09 MED ORDER — POTASSIUM & SODIUM PHOSPHATES 280-160-250 MG PO PACK
1.0000 | PACK | Freq: Three times a day (TID) | ORAL | Status: AC
  Administered 2017-12-09 (×3): 1 via ORAL
  Filled 2017-12-09 (×3): qty 1

## 2017-12-09 MED ORDER — FUROSEMIDE 10 MG/ML IJ SOLN
40.0000 mg | Freq: Every day | INTRAMUSCULAR | Status: DC
  Administered 2017-12-09 – 2017-12-15 (×7): 40 mg via INTRAVENOUS
  Filled 2017-12-09 (×7): qty 4

## 2017-12-09 MED ORDER — SODIUM CHLORIDE 0.9 % IV SOLN
INTRAVENOUS | Status: DC | PRN
  Administered 2017-12-09: 250 mL via INTRAVENOUS
  Administered 2017-12-11: 500 mL via INTRAVENOUS

## 2017-12-09 NOTE — Plan of Care (Signed)
  Problem: Clinical Measurements: Goal: Will remain free from infection Outcome: Progressing Goal: Respiratory complications will improve Outcome: Progressing Goal: Cardiovascular complication will be avoided Outcome: Progressing   

## 2017-12-09 NOTE — Progress Notes (Signed)
Pt placed back on full vent support due to apnea.

## 2017-12-09 NOTE — Progress Notes (Signed)
Patient transported to and from MRI without complications.  

## 2017-12-09 NOTE — Progress Notes (Signed)
Subjective/Chief Complaint: On vent   Objective: Vital signs in last 24 hours: Temp:  [97.7 F (36.5 C)-98.6 F (37 C)] 98.4 F (36.9 C) (09/24 0749) Pulse Rate:  [104-134] 120 (09/24 0700) Resp:  [0-27] 20 (09/24 0700) BP: (85-120)/(67-88) 120/88 (09/24 0700) SpO2:  [99 %-100 %] 100 % (09/24 0700) Arterial Line BP: (97-147)/(55-86) 147/86 (09/24 0700) FiO2 (%):  [40 %] 40 % (09/24 0400) Weight:  [65.5 kg] 65.5 kg (09/23 1229) Last BM Date: 12/01/17  Intake/Output from previous day: 09/23 0701 - 09/24 0700 In: 1202.3 [I.V.:872.5; NG/GT:180; IV Piggyback:149.8] Out: 1450 [Urine:1450] Intake/Output this shift: No intake/output data recorded.  General appearance: on vent Resp: few rhonchi Cardio: regular rate and rhythm GI: soft, GJ tube site OK, no appreciable tenderness Neuro: on vent  Lab Results:  Recent Labs    12/07/17 0504 12/09/17 0420  WBC 7.7 12.9*  HGB 11.8* 11.5*  HCT 36.1 33.0*  PLT 96* 107*   BMET Recent Labs    12/08/17 0827 12/09/17 0420  NA 145 146*  K 3.7 3.2*  CL 115* 114*  CO2 23 26  GLUCOSE 124* 106*  BUN <5* 5*  CREATININE 0.78 0.76  CALCIUM 7.7* 7.4*   PT/INR Recent Labs    12/06/17 1931 12/07/17 0242  LABPROT 26.3* 28.6*  INR 2.44 2.71   ABG Recent Labs    12/06/17 2051 12/07/17 0523 12/07/17 1016  PHART 7.370  --  7.543*  HCO3 21.9 18.4* 18.7*    Studies/Results: No results found.  Anti-infectives: Anti-infectives (From admission, onward)   Start     Dose/Rate Route Frequency Ordered Stop   12/07/17 0000  piperacillin-tazobactam (ZOSYN) IVPB 3.375 g     3.375 g 12.5 mL/hr over 240 Minutes Intravenous Every 8 hours 12/06/17 2231     12/05/17 1400  piperacillin-tazobactam (ZOSYN) IVPB 3.375 g  Status:  Discontinued     3.375 g 12.5 mL/hr over 240 Minutes Intravenous Every 8 hours 12/05/17 1235 12/06/17 2231   2018-01-26 1800  vancomycin (VANCOCIN) IVPB 750 mg/150 ml premix  Status:  Discontinued     750  mg 150 mL/hr over 60 Minutes Intravenous Every 12 hours 2018-01-26 1120 2018-01-26 1123   2018-01-26 1800  vancomycin (VANCOCIN) 500 mg in sodium chloride 0.9 % 100 mL IVPB  Status:  Discontinued     500 mg 100 mL/hr over 60 Minutes Intravenous Every 8 hours 2018-01-26 1123 12/05/17 0633   2018-01-26 1300  vancomycin (VANCOCIN) 500 mg in sodium chloride 0.9 % 100 mL IVPB  Status:  Discontinued     500 mg 100 mL/hr over 60 Minutes Intravenous Every 8 hours 2018-01-26 0905 2018-01-26 1120   2018-01-26 1200  ceFEPIme (MAXIPIME) 1 g in sodium chloride 0.9 % 100 mL IVPB  Status:  Discontinued     1 g 200 mL/hr over 30 Minutes Intravenous Every 8 hours 2018-01-26 0905 12/05/17 1235   2018-01-26 0400  ceFEPIme (MAXIPIME) 2 g in sodium chloride 0.9 % 100 mL IVPB     2 g 200 mL/hr over 30 Minutes Intravenous  Once 2018-01-26 0356 2018-01-26 0448   2018-01-26 0400  metroNIDAZOLE (FLAGYL) IVPB 500 mg  Status:  Discontinued     500 mg 100 mL/hr over 60 Minutes Intravenous Every 8 hours 2018-01-26 0356 12/05/17 1235   2018-01-26 0400  vancomycin (VANCOCIN) IVPB 1000 mg/200 mL premix     1,000 mg 200 mL/hr over 60 Minutes Intravenous  Once 2018-01-26 0356 2018-01-26 [id number]  Assessment/Plan: Cerebral palsy with chronic spastic quadraparesis S/P change of chronic G-J tube by Dr. Loreta Ave 9/17 - tube in place. Findings on CT of possible contained perforation - sealed vs was just changes due to tube change. Tolerating trickle TF - advance to goal as tolerated ID - on Zosyn Cardiac arrest - was cooled per CCM VDRF and PNA - per CCM  I spoke with her father and caregiver  LOS: 6 days    Liz Malady 12/09/2017

## 2017-12-09 NOTE — Progress Notes (Signed)
Now off Versed. EEG showed burst suppression compatible with diffuse anoxic injury. Would wait an additional 24-48 hours before repeat neurological exam to provide prognostic information. An MRI brain has been ordered to further assess for imaging markers of anoxic brain injury.   Electronically signed: Dr. Caryl PinaEric Wm Sahagun

## 2017-12-09 NOTE — Progress Notes (Signed)
NAME:  Amber Curry, MRN:  [id number], DOB:  16-Sep-1990, LOS: 6 ADMISSION DATE:  12/04/2017, CONSULTATION DATE:  12/06/17 REFERRING MD:  Hospitalist CHIEF COMPLAINT:  Cardiac arrest   Brief History   27 y.o female, s/p cardiac arrest on the floor 9/21 , transferred to the ICU. 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  Patient was in her usual state of health until after returning home from routine GJ tube exchange 9/17 when her mother noticed the patient was diaphoretic and not acting like her usual self.   She was admitted with presumptive diagnosis of sepsis due to aspiration  CT abdomen/pelvis showed consolidation vs atelectasis in right lung base, dilated and fluid-filled esophagus suggesting reflux or dysmotility and GJT appeared to be in appropriate position. Also noted was mild wall-thickening of sigmoid colon and while no obvious bowel perforation was appreciated, repeat study with water-soluble bowel contrast was suspicious for localized contained perforation in the mid-abdomen. General surgery was consulted for evaluation.  She sustained cardiac arrest on 9/21 and underwent therapeutic hypothermia     Procedures (surgical and bedside):  12/06/17: intubation, cpr 9/17 exchange of GJ tube   Significant Diagnostic Tests:  12/02/2017, 19/19/19: CT abdomen 12/07/07- eeg, CT head 12/06/17- CTA, CT abdomen  Micro Data: 12/06/2017: urine and blood cultures pending 11/24/2017: MRSA PCR negative  Antimicrobials:  12/05/17: Zosyn  Subjective:  She remains orally intubated on vent, off pressors and unresponsive   Objective   Blood pressure (!) 117/94, pulse (!) 120, temperature 98.4 F (36.9 C), temperature source  Core, resp. rate 19, height 4' (1.219 m), weight 65.5 kg, SpO2 100 %. CVP:  [7 mmHg-15 mmHg] 13 mmHg  Vent Mode: PRVC FiO2 (%):  [40 %] 40 % Set Rate:  [12 bmp] 12 bmp Vt Set:  [350 mL] 350 mL PEEP:  [5 cmH20] 5 cmH20 Pressure Support:  [20 cmH20] 20 cmH20 Plateau Pressure:  [18 cmH20-22 cmH20] 20 cmH20   Intake/Output Summary (Last 24 hours) at 12/09/2017 1022 Last data filed at 12/09/2017 0900 Gross per 24 hour  Intake 1181.08 ml  Output 1400 ml  Net -218.92 ml   Filed Weights   12/07/17 0215 12/08/17 0600 12/08/17 1229  Weight: 60.4 kg 60.5 kg 65.5 kg    Examination: Gen. Critically ill, in no distress, sedated ENT - oral ETT,short neck, large tongue Neck: No JVD, no thyromegaly, no carotid bruits Lungs: no use of accessory muscles, no dullness to percussion, decreased BS BL without rales or rhonchi  Cardiovascular: Rhythm regular, heart sounds  normal, no murmurs or gallops, no peripheral edema Abdomen: soft and non-tender, no hepatosplenomegaly, BS normal. Musculoskeletal: contractures +, no cyanosis or clubbing Neuro:   Unresponsive,pupils    Resolved Hospital Problem list    Assessment & Plan:  Cardiac Arrest , cardiogenic shock -resolved -etiology unknown , CTA neg PE, presumed aspiration - Levophed  off  Acute respiratory failure -Ventilator settings reviewed and adjusted -Attempt SBTs -Chest x-ray personally reviewed which shows progressive bilateral airspace disease and effusions -lasix 40 daily  Cerebral Palsy, Spastic Quadriplegia, Dysphagia with PEG, Cognitive Impairment, Non-verbal, Cortical Blindness -Ongoing discussion about goals of care  Seizure Disorder, burst suppression pattern on initial EEG 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 -Repeat EEG  9/24 no seizure -  Continue keppra, tegretol, phenobarb -MRI ordered by neuro   Aspiration pna, MRSA pcr neg  - ct zosyn, plan 7 ds  Dysphagia, GJT-change 9/17 Suspicion for contained  perforation -ct trickle feeds per surgery  Hypokalemia/ hypophos -replete k-phos as required      Disposition / Summary of Today's Plan 12/09/17   Await neuro prognostication , longer she remains unresponsive off all sedation, confirms poor prognosis Goals of care discussed with family 9/24-mom and dad and grandmother present with caregiver, discussed that if we are to proceed forward she would need tracheostomy.  Mom does not want her to suffer any more pain.  No CPR no cardioversion orders issued.  Mom will have difficulty to consent to withdrawal of life support.  Will involve palliative care     Code Status: full Family Communication: Goals of care discussion  today  Labs   CBC: Recent Labs  Lab 12/06/2017 0315  12/06/17 0549 12/06/17 1112 12/06/17 1931 12/06/17 2107 12/06/17 2303 12/07/17 0111 12/07/17 0504 12/09/17 0420  WBC 4.0   < > 8.6 10.4 10.2  --   --   --  7.7 12.9*  NEUTROABS 3.2  --   --   --   --   --   --   --   --   --   HGB 17.3*   < > 12.5 12.3 11.7* 11.6* 11.9* 11.6* 11.8* 11.5*  HCT 53.3*   < > 41.2 39.6 36.5 34.0* 35.0* 34.0* 36.1 33.0*  MCV 91.3   < > 97.9 96.8 93.4  --   --   --  90.7 85.3  PLT 302   < > 175 133* 127*  --   --   --  96* 107*   < > = values in this interval not displayed.   Basic Metabolic Panel: Recent Labs  Lab 12/07/17 2054 12/08/17 0055 12/08/17 0505 12/08/17 0827 12/09/17 0420  NA 141 144 145 145 146*  K 3.5 3.7 3.7 3.7 3.2*  CL 117* 114* 116* 115* 114*  CO2 20* 21* 24 23 26   GLUCOSE 125* 134* 134* 124* 106*  BUN 5* <5* <5* <5* 5*  CREATININE 0.69 0.81 0.75 0.78 0.76  CALCIUM 7.3* 7.4* 7.6* 7.7* 7.4*  MG  --   --   --   --  1.7  PHOS  --   --   --   --  1.1*   GFR: Estimated Creatinine Clearance: 60.4 mL/min (by C-G formula based on SCr of 0.76 mg/dL). Recent Labs  Lab 12/04/17 0014 12/04/17 0746  12/05/17 0730  12/06/17 0719 12/06/17 1112 12/06/17 1931 12/07/17 0504 12/09/17 0420  WBC 4.8  --    < >   --    < >  --  10.4 10.2 7.7 12.9*  LATICACIDVEN 3.6* 2.3*  --  2.0*  --  1.7  --   --   --   --    < > = values in this interval not displayed.   Liver Function Tests: Recent Labs  Lab 12/05/2017 0315 12/04/17 0014 12/06/17 1112  AST 43* 25 27  ALT 49* 32 26  ALKPHOS 65 38 98  BILITOT 0.7 0.4 1.0  PROT 6.1* 4.5* 4.6*  ALBUMIN 2.9* 2.0* 1.9*   No results for input(s): LIPASE, AMYLASE in the last 168 hours. No results for input(s): AMMONIA in the last 168 hours. ABG    Component Value Date/Time   PHART 7.543 (H) 12/07/2017 1016   PCO2ART 21.0 (L)  12/07/2017 1016   PO2ART 130.0 (H) 12/07/2017 1016   HCO3 18.7 (L) 12/07/2017 1016   TCO2 19 (L) 12/07/2017 1016   ACIDBASEDEF 4.0 (H) 12/07/2017 1016   O2SAT 100.0 12/07/2017 1016    Coagulation Profile: Recent Labs  Lab 12/06/17 1112 12/06/17 1844 12/06/17 1931 12/07/17 0242  INR 2.35 2.44 2.44 2.71   Cardiac Enzymes: Recent Labs  Lab 12/06/17 1931 12/07/17 0046 12/07/17 0644 12/07/17 1213  TROPONINI 0.69* 0.29* 0.22* 0.17*   HbA1C: No results found for: HGBA1C CBG: Recent Labs  Lab 12/08/17 0832 12/08/17 1629 12/08/17 1952 12/08/17 2353 12/09/17 0423  GLUCAP 109* 101* 105* 72 92    My independent critical care time x 34m  Cyril Mourning MD. FCCP. [facility] Pager [phone] If no response call [phone]   12/09/2017

## 2017-12-09 NOTE — Op Note (Signed)
[facility] FOR [facility] LAB REPLACEMENT ONLY  The [facility] does [facility] Electrolyte [facility] Protocol based on the criteria listed below:   1. Is GFR >/= 40 ml/min? Yes.    Patient's GFR today is >602. Is urine output >/= 0.5 ml/kg/hr for the last 6 hours? Yes.   Patient's UOP is 0.7 ml/kg/hr 3. Is BUN < 60 mg/dL? Yes.    Patient's BUN today is 5 4. Abnormal electrolyte(s)K+=3/2 5. Ordered repletion with: per protocol 6. If a panic level lab has been reported, has the CCM MD in charge been notified? Yes.  .   Physician:  Laural BenesSommer,S MD  Hurman HornStophel, Emmerie Battaglia Parker 12/09/2017 5:57 AM

## 2017-12-10 ENCOUNTER — Inpatient Hospital Stay (HOSPITAL_COMMUNITY): Payer: Medicare Other

## 2017-12-10 DIAGNOSIS — J189 Pneumonia, unspecified organism: Secondary | ICD-10-CM

## 2017-12-10 DIAGNOSIS — J9690 Respiratory failure, unspecified, unspecified whether with hypoxia or hypercapnia: Secondary | ICD-10-CM

## 2017-12-10 DIAGNOSIS — I469 Cardiac arrest, cause unspecified: Secondary | ICD-10-CM

## 2017-12-10 LAB — BASIC METABOLIC PANEL
ANION GAP: 10 (ref 5–15)
BUN: 8 mg/dL (ref 6–20)
CALCIUM: 6.8 mg/dL — AB (ref 8.9–10.3)
CO2: 29 mmol/L (ref 22–32)
Chloride: 104 mmol/L (ref 98–111)
Creatinine, Ser: 0.72 mg/dL (ref 0.44–1.00)
GFR calc non Af Amer: >60 mL/min (ref >60)
GLUCOSE: 89 mg/dL (ref 70–99)
POTASSIUM: 3.5 mmol/L (ref 3.5–5.1)
SODIUM: 143 mmol/L (ref 135–145)

## 2017-12-10 LAB — GLUCOSE, CAPILLARY
GLUCOSE-CAPILLARY: 112 mg/dL — AB (ref 70–99)
GLUCOSE-CAPILLARY: 88 mg/dL (ref 70–99)
GLUCOSE-CAPILLARY: 104 mg/dL — AB (ref 70–99)
Glucose-Capillary: 113 mg/dL — ABNORMAL HIGH (ref 70–99)
Glucose-Capillary: 90 mg/dL (ref 70–99)
Glucose-Capillary: 78 mg/dL (ref 70–99)
Glucose-Capillary: 91 mg/dL (ref 70–99)

## 2017-12-10 LAB — CBC
HCT: 31.9 % — ABNORMAL LOW (ref 36.0–46.0)
HEMOGLOBIN: 11.2 g/dL — AB (ref 12.0–15.0)
MCH: 29.9 pg (ref 26.0–34.0)
MCHC: 35.1 g/dL (ref 30.0–36.0)
MCV: 85.1 fL (ref 78.0–100.0)
Platelets: 132 10*3/uL — ABNORMAL LOW (ref 150–400)
RBC: 3.75 MIL/uL — ABNORMAL LOW (ref 3.87–5.11)
RDW: 12.8 % (ref 11.5–15.5)
WBC: 15.4 10*3/uL — ABNORMAL HIGH (ref 4.0–10.5)

## 2017-12-10 LAB — PHOSPHORUS: PHOSPHORUS: 4.5 mg/dL (ref 2.5–4.6)

## 2017-12-10 LAB — MAGNESIUM: MAGNESIUM: 1.4 mg/dL — AB (ref 1.7–2.4)

## 2017-12-10 MED ORDER — GERHARDT'S BUTT CREAM
TOPICAL_CREAM | CUTANEOUS | Status: DC | PRN
  Administered 2017-12-10: 10:00:00 via TOPICAL
  Administered 2017-12-12: 1 via TOPICAL
  Administered 2017-12-13: via TOPICAL
  Filled 2017-12-10: qty 1

## 2017-12-10 MED ORDER — SODIUM CHLORIDE 0.9 % IV SOLN
6.0000 g | Freq: Once | INTRAVENOUS | Status: DC
  Filled 2017-12-10: qty 12

## 2017-12-10 MED ORDER — MAGNESIUM SULFATE 2 GM/50ML IV SOLN
2.0000 g | Freq: Once | INTRAVENOUS | Status: AC
  Administered 2017-12-10: 2 g via INTRAVENOUS
  Filled 2017-12-10: qty 50

## 2017-12-10 NOTE — Plan of Care (Signed)
Patient status remains unchanged.  Family at bedside.  Spoke with father relating to plan of care.  Family very supportive, father Amber Curry asking appropriate questions relating to care.  Support given to family and provided to patient.  Patient did open her eyes but is unable to follow commands or indicate understanding.  Monitoring.

## 2017-12-10 NOTE — Progress Notes (Addendum)
RT started wean at 0744. Pt setting 20/5 40%. Pt resting comfortably. RT will continue to monitor.

## 2017-12-10 NOTE — Plan of Care (Signed)
  Problem: Clinical Measurements: Goal: Will remain free from infection Outcome: Progressing Goal: Diagnostic test results will improve Outcome: Progressing Goal: Cardiovascular complication will be avoided Outcome: Progressing   

## 2017-12-10 NOTE — Progress Notes (Signed)
NAME:  Amber Curry, MRN:  [id number], DOB:  12-Mar-1991, LOS: 7 ADMISSION DATE:  12-31-2017, CONSULTATION DATE:  12/06/17 REFERRING MD:  Hospitalist CHIEF COMPLAINT:  Cardiac arrest   Brief History   27 y.o female, s/p cardiac arrest on the floor 9/21 , transferred to the ICU. 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  Admitted for decreased responsiveness and generally not appearing herself according to her parents following a routine G-tube change on 9/17.  Was treated for a presumptive diagnosis of sepsis due to aspiration.  She sustained cardiac arrest on 9/21 and underwent therapeutic hypothermia.  Has been limited neurological recovery since then.  Procedures (surgical and bedside):  12/06/17: intubation, cpr 9/17 exchange of GJ tube  Subjective:  She remains orally intubated on vent, off pressors and unresponsive   Objective   Blood pressure 116/88, pulse (!) 116, temperature 98.6 F (37 C), temperature source Core, resp. rate 20, height 4' (1.219 m), weight 62 kg, SpO2 99 %. CVP:  [10 mmHg-15 mmHg] 10 mmHg  Vent Mode: PSV;CPAP FiO2 (%):  [40 %] 40 % Set Rate:  [12 bmp] 12 bmp Vt Set:  [350 mL] 350 mL PEEP:  [5 cmH20] 5 cmH20 Pressure Support:  [20 cmH20] 20 cmH20 Plateau Pressure:  [19 cmH20-24 cmH20] 24 cmH20   Intake/Output Summary (Last 24 hours) at 12/10/2017 0835 Last data filed at 12/10/2017 0800 Gross per 24 hour  Intake 1469.4 ml  Output 3835 ml  Net -2365.6 ml   Filed Weights   12/08/17 0600 12/08/17 1229 12/10/17 0253  Weight: 60.5 kg 65.5 kg 62 kg    Examination: Gen. Critically ill, small in stature with facies compatible with cerebral palsy ENT - oral ETT,short neck, large tongue Neck:, no thyromegaly, no carotid bruits Lungs: no use of accessory muscles,  no dullness to percussion, decreased BS BL without rales or rhonchi.  Periods of hypercapnia or apnea on pressure support of 20.  More consistent respiratory rate on pressure support 10 (approximately 8 cc/kg for her.) . Cardiovascular: Rhythm regular, heart sounds  normal, no murmurs or gallops, marked peripheral edema  Abdomen: soft and non-tender, no hepatosplenomegaly, BS normal. Musculoskeletal: contractures +, no cyanosis or clubbing Neuro:  some spontaneous eye opening today according to family but unresponsive for me.  No seizure activity.   Resolved Hospital Problem list    Assessment & Plan:  Cardiac Arrest , cardiogenic shock -resolved -etiology unknown , CTA neg PE, presumed aspiration - Levophed  Off -Now appears to be approaching baseline according to family  Acute respiratory failure We will readjust pressure support goals to better meet what is likely her baseline. -Continue daily diuresis and follow electrolytes.  Cerebral Palsy, Spastic Quadriplegia, Dysphagia with PEG, Cognitive Impairment, Non-verbal, Cortical Blindness -Ongoing discussion about goals of care.  Patient has a marginal level of premorbid function.  Successful extubation will be the big challenge.  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  Degree of improvement will be difficult to ascertain  given patient's poor baseline function.  Safe extubation will be problematic.  May benefit from preemptive tracheostomy particularly given high ongoing risk of aspiration.  Goals of care discussed with family  9/24-mom and dad and grandmother present with caregiver, discussed that if we are to proceed forward she would need tracheostomy.  Mom does not want her to suffer any more pain.  No CPR no cardioversion orders issued.  Mom will have difficulty to consent to withdrawal of life support.  Will involve palliative care     Code Status: full Family Communication: Goals of care discussion  today  Labs   CBC: Mild reactive leukocytosis. Recent Labs  Lab 12/06/17 1112 12/06/17 1931  12/06/17 2303 12/07/17 0111 12/07/17 0504 12/09/17 0420 12/10/17 0416  WBC 10.4 10.2  --   --   --  7.7 12.9* 15.4*  HGB 12.3 11.7*   < > 11.9* 11.6* 11.8* 11.5* 11.2*  HCT 39.6 36.5   < > 35.0* 34.0* 36.1 33.0* 31.9*  MCV 96.8 93.4  --   --   --  90.7 85.3 85.1  PLT 133* 127*  --   --   --  96* 107* 132*   < > = values in this interval not displayed.   Basic Metabolic Panel: No significant abnormalities today Recent Labs  Lab 12/08/17 0055 12/08/17 0505 12/08/17 0827 12/09/17 0420 12/10/17 0416  NA 144 145 145 146* 143  K 3.7 3.7 3.7 3.2* 3.5  CL 114* 116* 115* 114* 104  CO2 21* 24 23 26 29   GLUCOSE 134* 134* 124* 106* 89  BUN <5* <5* <5* 5* 8  CREATININE 0.81 0.75 0.78 0.76 0.72  CALCIUM 7.4* 7.6* 7.7* 7.4* 6.8*  MG  --   --   --  1.7 1.4*  PHOS  --   --   --  1.1* 4.5   GFR: Estimated Creatinine Clearance: 58.1 mL/min (by C-G formula based on SCr of 0.72 mg/dL). Recent Labs  Lab 12/04/17 0014 12/04/17 0746  12/05/17 0730  12/06/17 0719  12/06/17 1931 12/07/17 0504 12/09/17 0420 12/10/17 0416  WBC 4.8  --    < >  --    < >  --    < > 10.2 7.7 12.9* 15.4*  LATICACIDVEN 3.6* 2.3*  --  2.0*  --  1.7  --   --   --   --   --    < > = values in this interval not displayed.   Liver Function Tests: Recent Labs  Lab 12/04/17 0014 12/06/17 1112  AST 25 27  ALT 32 26  ALKPHOS 38 98  BILITOT 0.4 1.0  PROT 4.5* 4.6*  ALBUMIN 2.0* 1.9*   No results for input(s): LIPASE, AMYLASE in the  last 168 hours. No results for input(s): AMMONIA in the last 168 hours. ABG    Component Value Date/Time   PHART 7.543 (H) 12/07/2017 1016   PCO2ART 21.0 (L) 12/07/2017 1016   PO2ART 130.0 (H) 12/07/2017 1016   HCO3 18.7 (L) 12/07/2017 1016   TCO2 19 (L) 12/07/2017 1016   ACIDBASEDEF 4.0 (H) 12/07/2017 1016   O2SAT 100.0 12/07/2017 1016    Coagulation Profile: Remains therapeutic anti-coag Recent Labs  Lab 12/06/17 1112 12/06/17 1844 12/06/17 1931 12/07/17 0242  INR 2.35 2.44 2.44 2.71   Cardiac Enzymes: Improving troponin leak secondary to cardiac arrest Recent Labs  Lab 12/06/17 1931 12/07/17 0046 12/07/17 0644 12/07/17 1213  TROPONINI 0.69* 0.29* 0.22* 0.17*   HbA1C: No  results found for: HGBA1C CBG: Acceptable glycemic control Recent Labs  Lab 12/09/17 1639 12/09/17 2006 12/10/17 0039 12/10/17 0401 12/10/17 0747  GLUCAP 116* 95 113* 90 88   December 28, 2017, 19/19/19: CT abdomen 12/07/07- eeg, CT head 12/06/17- CTA, CT abdomen  Micro Data: 12-28-2017: urine and blood cultures pending 12-28-2017: MRSA PCR negative  Antimicrobials:  Zosyn  CRITICAL CARE Performed by: Lynnell Catalan  Total critical care time: 30 minutes  Critical care time was exclusive of separately billable procedures and treating other patients.  Critical care was necessary to treat or prevent imminent or life-threatening deterioration.  Critical care was time spent personally by me on the following activities: development of treatment plan with patient and/or surrogate as well as nursing, discussions with consultants, evaluation of patient's response to treatment, examination of patient, obtaining history from patient or surrogate, ordering and performing treatments and interventions, ordering and review of laboratory studies, ordering and review of radiographic studies, pulse oximetry and re-evaluation of patient's condition.  Lynnell Catalan, MD [facility] ICU Physician [facility]    Pager: [phone] Mobile: [phone] After hours: [phone].   12/10/2017

## 2017-12-10 NOTE — Progress Notes (Signed)
[facility] ADULT ICU REPLACEMENT PROTOCOL FOR AM LAB REPLACEMENT ONLY  The patient does apply for the [facility] Adult ICU Electrolyte Replacment Protocol based on the criteria listed below:   1. Is GFR >/= 40 ml/min? Yes.    Patient's GFR today is >60 2. Is urine output >/= 0.5 ml/kg/hr for the last 6 hours? Yes.   Patient's UOP is 1.5 ml/kg/hr 3. Is BUN < 60 mg/dL? Yes.    Patient's BUN today is 8 4. Abnormal electrolyte(s):mag +=1.4 5. Ordered repletion with: per protocol 6. If a panic level lab has been reported, has the CCM MD in charge been notified? Yes.  .   Physician:  Kasa,MD  Hurman Horn 12/10/2017 5:56 AM

## 2017-12-10 NOTE — Progress Notes (Addendum)
Subjective: At this point patient is intubated, does not open her eyes to any verbal stimuli however father at bedside states that she does when he talks to her.  She is breathing over the vent.  She is not on any sedative medications.     Exam: Vitals:   12/10/17 0744 12/10/17 0800  BP: 102/73 116/88  Pulse: (!) 111 (!) 116  Resp: (!) 24 20  Temp:    SpO2: 100% 99%    Physical Exam   HEENT-microcephalic Lungs-intubated breathing over the vent Extremities-small, contracted limbs x4, quadriplegic at baseline   Neuro:  Mental Status: Patient does not respond to verbal stimuli.  Does not respond to deep sternal rub.  Does not follow commands.  No verbalizations noted.  Intubated breathing over the vent Cranial Nerves: II: Does not blink to confrontation III,IV,VI: doll's response absent bilaterally. pupils right 2 mm, left 2 mm,and nonreactive bilaterally V,VII: corneal reflex present bilaterally  VIII: patient does not respond to verbal stimuli IX,X: gag reflex absent, XI: trapezius strength unable to test bilaterally XII: tongue strength unable to test Motor: Contracted bilaterally.  No spontaneous movement noted.  No purposeful movements noted. Sensory: Does not respond to noxious stimuli in any extremity. Deep Tendon Reflexes:  Absent throughout. Plantars: bilaterally Cerebellar: Unable to perform    Medications:  Scheduled: . artificial tears  1 application Both Eyes P7X  . baclofen  10 mg Per Tube QHS  . baclofen  15 mg Per Tube Q1400  . baclofen  20 mg Per Tube Daily  . carBAMazepine  220 mg Per Tube BID   And  . carBAMazepine  240 mg Per Tube Daily  . chlorhexidine gluconate (MEDLINE KIT)  15 mL Mouth Rinse BID  . Chlorhexidine Gluconate Cloth  6 each Topical Q0600  . feeding supplement (VITAL AF 1.2 CAL)  1,000 mL Per Tube Q24H  . fentaNYL (SUBLIMAZE) injection  100 mcg Intravenous Once  . furosemide  40 mg Intravenous Daily  . heparin  5,000 Units  Subcutaneous Q8H  . iohexol  75 mL Intravenous Once  . levETIRAcetam  500 mg Per Tube Daily  . levETIRAcetam  750 mg Per Tube QHS  . mouth rinse  15 mL Mouth Rinse 10 times per day  . midazolam  2 mg Intravenous Once  . pantoprazole sodium  40 mg Per Tube Daily  . PHENObarbital  50 mg Per Tube BID  . sodium chloride flush  10-40 mL Intracatheter Q12H   Continuous: . sodium chloride Stopped (12/10/17 0755)  . sodium chloride Stopped (12/10/17 0757)  . piperacillin-tazobactam (ZOSYN)  IV 12.5 mL/hr at 12/10/17 0800    Pertinent Labs/Diagnostics:   Mr Brain Wo Contrast  Result Date: 12/09/2017 CLINICAL DATA:  Status post cardiac arrest December 06, 2017. EEG demonstrating diffuse anoxic injury. History of cerebral palsy and spastic quadriplegia, microcephaly. EXAM: MRI HEAD WITHOUT CONTRAST TECHNIQUE: Multiplanar, multiecho pulse sequences of the brain and surrounding structures were obtained without intravenous contrast. COMPARISON:  CT HEAD December 06, 2017 FINDINGS: INTRACRANIAL CONTENTS: No reduced diffusion to suggest acute ischemia. No susceptibility artifact to suggest hemorrhage. LEFT ventriculostomy catheter via LEFT parietal approach, gliosis along the tract. The catheter does not appear to traverse the ventricles. Slit like parallel ventricles are unchanged. No hydrocephalus. Agenesis of the corpus callosum. Hypoplastic vermis with nearly aplastic cerebellar hemispheres. Microcephaly. Cystic changes within bilateral deep gray nuclei may be developmental or new natal infarcts. No abnormal extra-axial fluid collections. VASCULAR: Normal major intracranial vascular flow  voids present at skull base. SKULL AND UPPER CERVICAL SPINE: No abnormal sellar expansion. No suspicious calvarial bone marrow signal. Craniocervical junction maintained. SINUSES/ORBITS: Moderate paranasal sinusitis. Bilateral mastoid effusions. Proptosis. Atrophic versus hypoplastic bilateral optic nerves. OTHER: None.  IMPRESSION: 1. No acute intracranial process. 2. 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 3. LEFT ventriculoperitoneal shunt.  No hydrocephalus. Electronically Signed   By: Elon Alas M.D.   On: 12/09/2017 22:57   --- MRI brain completed.  Although there is no evidence on MRI for diffuse anoxic brain injury, MRI does have a high false negative rate for this clinical entity.  -- EEG: Shows abnormal electrode encephalogram secondary to burst suppression pattern compatible with diffuse anoxic injury.    Amber Quill PA-C [facility] [phone]   Assessment: 27 year old female with severe static encephalopathy, cerebral palsy with severe spastic quadriparesis, cortical blindness and seizure disorder, status post cardiac arrest.  1. EEG off sedation on 9/21 showed burst suppression pattern, suggestive of diffuse anoxic brain injury.  2. Currently off sedation.  3. Currently rewarmed  4. Current anticonvulsant regimen: Carbamazepine, Keppra and phenobarbital. 5.  MRI has been done. Although there is no evidence of diffuse anoxic brain injury MRI does have a high false negative rate for clinical entity.  Recommendations: - Continue talks with palliative care. - At this time given EEG findings consistent with anoxic brain injury and no significant improvement, prognosis is felt to be poor from a neurological standpoint. - At this time neurology will sign off  Electronically signed: Dr. Kerney Elbe 12/10/2017, 8:22 AM

## 2017-12-10 NOTE — Progress Notes (Signed)
RT  Discussed pt VT with CCM MD Agarwala and he wants pt on 8cc/kg (MD measured pt at 127cm which is 50"). When pt completes wean he wants her on this VT. RT will continue to monitor.

## 2017-12-10 NOTE — Progress Notes (Signed)
MRI brain completed yesterday evening. Although there is no evidence on MRI for diffuse anoxic brain injury, MRI does have a high false negative rate for this clinical entity. We will reassess the patient today for prognostication. It has been 48 hours since Versed was discontinued.   Electronically signed: Dr. Caryl Pina

## 2017-12-10 NOTE — Progress Notes (Signed)
Daily Progress Note   Patient Name: Amber Curry       Date: 12/10/2017 DOB: 11-05-1990  Age: 27 y.o. MRN#: [id number] Attending Physician: Rigoberto Noel, MD Primary Care Physician: Karleen Hampshire., MD Admit Date: 12/09/2017  Reason for Consultation/Follow-up: Establishing goals of care and Psychosocial/spiritual support  Subjective: Chaplain Sallyanne Kuster and I met at bedside with the patient's father Bonney Roussel in the morning.  Bonney Roussel described the tremendous support the family has in caring for [employer] Sheria Lang).  She has a big extended family as well as 24 hour hired care.  He describes her as always being very quiet.  She is unable to move her limbs or speak but will occasionally express agitation by making small noises.  She expresses herself primarily thru her eyes.   Bonney Roussel mentions that he is a Development worker, community and his ex-wife (patient's mother) works full time at [employer].  The family has excellent support thru their church.  Bonney Roussel conveyed his thanks for the wonderful care his daughter is receiving - "we couldn't ask for any better."  He also expressed confusion at the different recommendations from different physicians.  "We leave it in God's hands".  Assessment: Patient with s/p cardiac arrest.  She was cooled, rewarmed, and has been off of sedation for 48+ hours.  Per RN she is more alert today.  Her father is encouraged by the improvement.   Patient Profile/HPI:   27 year old with cerebral palsy, quadriparesis, cortical blindness, dysphagia seizure disorder has a chronic gastrojejunostomy tube. On chart review, it is noted that the patient went for a feeding tube change was discharged home, subsequently developed sweating was given Tylenol and was not feeling well and was brought  to the hospital. Initially there was concern for her GJ tube not functioning well, she was seen by surgery and interventional radiology. She was admitted for sepsis and pneumonia.  On 12/06/2017 she suffered asystolic cardiac arrest.  She received 6 minutes of CPR and was resuscitated.  She required intubation.  Neurology evaluation revealed an anoxic brain injury.  Length of Stay: 7  Current Medications: Scheduled Meds:  . artificial tears  1 application Both Eyes L5Z  . baclofen  10 mg Per Tube QHS  . baclofen  15 mg Per Tube Q1400  . baclofen  20 mg  Per Tube Daily  . carBAMazepine  220 mg Per Tube BID   And  . carBAMazepine  240 mg Per Tube Daily  . chlorhexidine gluconate (MEDLINE KIT)  15 mL Mouth Rinse BID  . Chlorhexidine Gluconate Cloth  6 each Topical Q0600  . feeding supplement (VITAL AF 1.2 CAL)  1,000 mL Per Tube Q24H  . fentaNYL (SUBLIMAZE) injection  100 mcg Intravenous Once  . furosemide  40 mg Intravenous Daily  . heparin  5,000 Units Subcutaneous Q8H  . iohexol  75 mL Intravenous Once  . levETIRAcetam  500 mg Per Tube Daily  . levETIRAcetam  750 mg Per Tube QHS  . mouth rinse  15 mL Mouth Rinse 10 times per day  . midazolam  2 mg Intravenous Once  . pantoprazole sodium  40 mg Per Tube Daily  . PHENObarbital  50 mg Per Tube BID  . sodium chloride flush  10-40 mL Intracatheter Q12H    Continuous Infusions: . sodium chloride Stopped (12/10/17 0755)  . sodium chloride Stopped (12/10/17 0757)  . piperacillin-tazobactam (ZOSYN)  IV 12.5 mL/hr at 12/10/17 1100    PRN Meds: sodium chloride, acetaminophen **OR** acetaminophen, albuterol, fentaNYL, Gerhardt's butt cream, metoprolol tartrate, midazolam, ondansetron **OR** ondansetron (ZOFRAN) IV, sodium chloride flush  Physical Exam        Petite young woman (appears much younger than stated age) intubated, does not respond to me CV tachycardic but regular, no frank m/r/g Resp no distress, intubated, in the process of  weaning Abdomen firm, NT Lower extremities with edema 2-3+  Vital Signs: BP (!) 142/97   Pulse (!) 106   Temp 98.6 F (37 C) (Oral)   Resp 19   Ht 4' (1.219 m)   Wt 62 kg   SpO2 98%   BMI 41.71 kg/m  SpO2: SpO2: 98 % O2 Device: O2 Device: Ventilator O2 Flow Rate: O2 Flow Rate (L/min): 3 L/min  Intake/output summary:   Intake/Output Summary (Last 24 hours) at 12/10/2017 1205 Last data filed at 12/10/2017 1100 Gross per 24 hour  Intake 1362.21 ml  Output 3525 ml  Net -2162.79 ml   LBM: Last BM Date: 12/10/17 Baseline Weight: Weight: 47.6 kg Most recent weight: Weight: 62 kg       Palliative Assessment/Data:  10%    Flowsheet Rows     Most Recent Value  Intake Tab  Unit at Time of Referral  ER  Palliative Care Primary Diagnosis  Other (Comment)  Date Notified  12/09/2017  Palliative Care Type  New Palliative care  Reason for referral  Clarify Goals of Care  Date of Admission  12/09/2017  Date first seen by Palliative Care  12/04/17  # of days Palliative referral response time  1 Day(s)  # of days IP prior to Palliative referral  0  Clinical Assessment  Psychosocial & Spiritual Assessment  Palliative Care Outcomes      Patient Active Problem List   Diagnosis Date Noted  . Severe sepsis ([place]) 11/21/2017  . CP (cerebral palsy) ([place]) 11/28/2017  . Dysphagia, pharyngoesophageal phase 05/22/2017  . Gastrostomy tube dependent ([place]) 05/22/2017  . Drooling 02/07/2014  . 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 Microcephalus ([place]) 01/31/2013  . Congenital quadriplegia ([place]) 01/31/2013  . 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 Long-term use of high-risk medication 01/31/2013    Palliative Care Plan    Recommendations/Plan:  PMT will continue to follow  in order to support the family and assist with difficult decisions.  Goals of Care and  Additional Recommendations:  Limitations on Scope of Treatment: Full Scope Treatment  Code Status:  Limited code  Prognosis:   Unable to determine - patient is very fragile and at high risk for an acute life ending event.   Discharge Planning:  To Be Determined  Care plan was discussed with family and Dr. Lynetta Mare  Thank you for allowing the Palliative Medicine Team to assist in the care of this patient.  Total time spent:  35 min.     Greater than 50%  of this time was spent counseling and coordinating care related to the above assessment and plan.  Florentina Jenny, PA-C Palliative Medicine  Please contact Palliative MedicineTeam phone at [phone] for questions and concerns between 7 am - 7 pm.   Please see AMION for individual provider pager numbers.

## 2017-12-10 NOTE — Progress Notes (Signed)
Patient ID: Amber Curry, female   DOB: 07-03-1990, 27 y.o.   MRN: [id number] Noted updated goals of care plan. No ongoing acute general surgical needs. Please recall [place] PRN.  Violeta Gelinas, MD, MPH, FACS Trauma: [phone] General Surgery: [phone]

## 2017-12-11 DIAGNOSIS — Z7189 Other specified counseling: Secondary | ICD-10-CM

## 2017-12-11 LAB — GLUCOSE, CAPILLARY
GLUCOSE-CAPILLARY: 77 mg/dL (ref 70–99)
Glucose-Capillary: 110 mg/dL — ABNORMAL HIGH (ref 70–99)
Glucose-Capillary: 91 mg/dL (ref 70–99)
Glucose-Capillary: 95 mg/dL (ref 70–99)

## 2017-12-11 LAB — BASIC METABOLIC PANEL
Anion gap: 12 (ref 5–15)
BUN: 5 mg/dL — AB (ref 6–20)
CHLORIDE: 99 mmol/L (ref 98–111)
CO2: 30 mmol/L (ref 22–32)
Calcium: 7.1 mg/dL — ABNORMAL LOW (ref 8.9–10.3)
Creatinine, Ser: 0.61 mg/dL (ref 0.44–1.00)
GFR calc Af Amer: >60 mL/min (ref >60)
GFR calc non Af Amer: >60 mL/min (ref >60)
Glucose, Bld: 130 mg/dL — ABNORMAL HIGH (ref 70–99)
POTASSIUM: 2.3 mmol/L — AB (ref 3.5–5.1)
SODIUM: 141 mmol/L (ref 135–145)

## 2017-12-11 MED ORDER — VITAL AF 1.2 CAL PO LIQD
1000.0000 mL | ORAL | Status: DC
  Administered 2017-12-11: 1000 mL

## 2017-12-11 MED ORDER — POTASSIUM CHLORIDE 20 MEQ/15ML (10%) PO SOLN
40.0000 meq | Freq: Four times a day (QID) | ORAL | Status: AC
  Administered 2017-12-11 (×3): 40 meq
  Filled 2017-12-11 (×3): qty 30

## 2017-12-11 MED ORDER — VITAL AF 1.2 CAL PO LIQD: 1000.0000 mL | ORAL | Status: DC

## 2017-12-11 NOTE — Progress Notes (Addendum)
Daily Progress Note   Patient Name: Amber Curry       Date: 12/11/2017 DOB: [birth year]  Age: 27 y.o. MRN#: [id number] Attending Physician: Kipp Brood, MD Primary Care Physician: Karleen Hampshire., MD Admit Date: 12/15/2017  Reason for Consultation/Follow-up: Establishing goals of care  Subjective:  I talked with Bonney Roussel (father) for approx 90 min.  He was wearing mirrored sunglasses and sitting in the waiting room.  He is thinking thru how this must have happened and what must have gone wrong.  We talked about trach.  Bonney Roussel and Edd Fabian (mother) do not like the thought of trach.  Bonney Roussel understands that a trach would make Amber Curry (patient) more susceptible to infection and that she would require a high level of care at home - the family would want her at home.  They would rather avoid a trach.  They are uncertain of the quality of life Sheria Lang would have.  We discussed the muscle weakness that comes with intubation, and then discussed extubation without reintubation.  Alec felt that Edd Fabian would make him make the decision regarding whether or not to re-intubate.  Bonney Roussel is inclined to believe the re-intubation would not be a good option (they would end up right back where they are right now - or worse), but he does not give a firm decision.  A lot of time was given to therapeutic listening.  Bonney Roussel talked a lot about Shanae's history.  He is not convinced she has an anoxic brain injury.  Her brain has never been normal.  She was born with hydrocephalus.  She has a permanent shunt (ventriculo-peritoneal shunt?).  Later she developed seizures and now has to remain on sedating seizure medication.       She first received her G/J tube when she was approx 3 because her lower esophageal sphincter was not working  correctly and she was not growing.  The G/J tube has always been in the same place.   We talked about family, work, and relationships.  Assessment: Patient seems more alert today.  Father says at [place] line her eyes would be open most of the day.  She is weaning.      Patient Profile/HPI:  27 year old with cerebral palsy, quadriparesis, cortical blindness, dysphagia seizure disorder has a chronic gastrojejunostomy tube. On chart review, it is noted  that the patient went for a feeding tube change was discharged home, subsequently developed sweating was given Tylenol and was not feeling well and was brought to the hospital. Initially there was concern for her GJ tube not functioning well, she was seen by surgery and interventional radiology. She was admitted for sepsis and pneumonia.  On 12/06/2017 she suffered asystolic cardiac arrest.  She received 6 minutes of CPR and was resuscitated.  She required intubation.  Neurology evaluation revealed an anoxic brain injury.    Length of Stay: 8  Current Medications: Scheduled Meds:  . artificial tears  1 application Both Eyes Z6X  . baclofen  10 mg Per Tube QHS  . baclofen  15 mg Per Tube Q1400  . baclofen  20 mg Per Tube Daily  . carBAMazepine  220 mg Per Tube BID   And  . carBAMazepine  240 mg Per Tube Daily  . chlorhexidine gluconate (MEDLINE KIT)  15 mL Mouth Rinse BID  . Chlorhexidine Gluconate Cloth  6 each Topical Q0600  . feeding supplement (VITAL AF 1.2 CAL)  1,000 mL Per Tube Q24H  . fentaNYL (SUBLIMAZE) injection  100 mcg Intravenous Once  . furosemide  40 mg Intravenous Daily  . heparin  5,000 Units Subcutaneous Q8H  . iohexol  75 mL Intravenous Once  . levETIRAcetam  500 mg Per Tube Daily  . levETIRAcetam  750 mg Per Tube QHS  . mouth rinse  15 mL Mouth Rinse 10 times per day  . midazolam  2 mg Intravenous Once  . pantoprazole sodium  40 mg Per Tube Daily  . PHENObarbital  50 mg Per Tube BID  . potassium chloride  40 mEq Per Tube  Q6H  . sodium chloride flush  10-40 mL Intracatheter Q12H    Continuous Infusions: . sodium chloride 10 mL/hr at 12/11/17 0124  . sodium chloride 10 mL/hr at 12/11/17 0700  . piperacillin-tazobactam (ZOSYN)  IV 3.375 g (12/11/17 0848)    PRN Meds: sodium chloride, acetaminophen **OR** acetaminophen, albuterol, fentaNYL, Gerhardt's butt cream, metoprolol tartrate, midazolam, ondansetron **OR** ondansetron (ZOFRAN) IV, sodium chloride flush  Physical Exam        Child like young woman.  Intubated, swollen tongue, eyes closed CV tachycardic resp no obvious distress Abdomen soft Extremities flaccid with 1-2+ edema.   Vital Signs: BP 105/76   Pulse (!) 123   Temp 98.6 F (37 C) (Axillary)   Resp (!) 31   Ht 4' (1.219 m)   Wt 58.9 kg   SpO2 98%   BMI 39.62 kg/m  SpO2: SpO2: 98 % O2 Device: O2 Device: Ventilator O2 Flow Rate: O2 Flow Rate (L/min): 3 L/min  Intake/output summary:   Intake/Output Summary (Last 24 hours) at 12/11/2017 1340 Last data filed at 12/11/2017 0700 Gross per 24 hour  Intake 515.24 ml  Output 2600 ml  Net -2084.76 ml   LBM: Last BM Date: 12/10/17 Baseline Weight: Weight: 47.6 kg Most recent weight: Weight: 58.9 kg       Palliative Assessment/Data: 10%    Flowsheet Rows     Most Recent Value  Intake Tab  Unit at Time of Referral  ER  Palliative Care Primary Diagnosis  Other (Comment)  Date Notified  12/05/2017  Palliative Care Type  New Palliative care  Reason for referral  Clarify Goals of Care  Date of Admission  11/25/2017  Date first seen by Palliative Care  12/04/17  # of days Palliative referral response time  1 Day(s)  #  of days IP prior to Palliative referral  0  Clinical Assessment  Psychosocial & Spiritual Assessment  Palliative Care Outcomes      Patient Active Problem List   Diagnosis Date Noted  . HCAP (healthcare-associated pneumonia)   . Respiratory failure ([place])   . Cardiac arrest ([place])   . Severe sepsis ([place])  11/28/2017  . CP (cerebral palsy) ([place]) 12/13/2017  . Dysphagia, pharyngoesophageal phase 05/22/2017  . Gastrostomy tube dependent ([place]) 05/22/2017  . Drooling 02/07/2014  . 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 Microcephalus ([place]) 01/31/2013  . 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 Long-term use of high-risk medication 01/31/2013    Palliative Care Plan    Recommendations/Plan:  Family is heavily dependent on recommendations from medical team however, they are leaning away from trach and towards extubation without reintubation.   They believe in God and want to put things in God's hands.   No firm decisions have been made.  Family will need on-going follow up.  Goals of Care and Additional Recommendations:  Limitations on Scope of Treatment: Full Scope Treatment  Code Status:  Limited code (although Bonney Roussel told me she was a DNR ??)  Prognosis:   Unable to determine.  Pending which direction is taken.  Patient is at risk for respiratory failure and imminent death without a trach as she may be unable to protect her airway.  She is at high risk for aspiration, infection, and poor quality of life with a trach.   Discharge Planning:  To Be Determined  Care plan was discussed with Dr. Lynetta Mare and patient's father.  Thank you for allowing the Palliative Medicine Team to assist in the care of this patient.  Total time spent:  90 min. Time in:  12:30 Time out:  2:00    Greater than 50%  of this time was spent counseling and coordinating care related to the above assessment and plan.  Florentina Jenny, PA-C Palliative Medicine  Please contact Palliative MedicineTeam phone at [phone] for questions and concerns between 7 am - 7 pm.   Please see AMION for individual provider pager numbers.

## 2017-12-11 NOTE — Progress Notes (Signed)
RT started pt wean at 0739 on PSV/CPAP 10/5 and FIO2 of 40%. Pt tolerating well. RT will continue to monitor.

## 2017-12-11 NOTE — Progress Notes (Signed)
RT terminated wean d/t pts Tachypnea (RR >35). Pt placed back on full support. RN is giving pt lasix at this time. Will try to wean later today again. RT will continue to monitor.

## 2017-12-11 NOTE — Progress Notes (Signed)
NAME:  Amber Curry, MRN:  [id number], DOB:  09-22-1990, LOS: 8 ADMISSION DATE:  12/17/17, CONSULTATION DATE:  12/06/17 REFERRING MD:  Hospitalist CHIEF COMPLAINT:  Cardiac arrest   Brief History   27 y.o female, s/p cardiac arrest on the floor 9/21 , transferred to the ICU. 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  Admitted for decreased responsiveness and generally not appearing herself according to her parents following a routine G-tube change on 9/17.  Was treated for a presumptive diagnosis of sepsis due to aspiration.  She sustained cardiac arrest on 9/21 and underwent therapeutic hypothermia.  Has been limited neurological recovery since then.  Procedures (surgical and bedside):  12/06/17: intubation, cpr 9/17 exchange of GJ tube  Subjective:  Tolerated pressure support ventilation for short period now back on full ventilatory support   Objective   Blood pressure 109/79, pulse (!) 108, temperature 98.6 F (37 C), temperature source Axillary, resp. rate 20, height 4' (1.219 m), weight 58.9 kg, SpO2 98 %. CVP:  [12 mmHg-14 mmHg] 14 mmHg  Vent Mode: PRVC FiO2 (%):  [40 %] 40 % Set Rate:  [12 bmp] 12 bmp Vt Set:  [350 mL] 350 mL PEEP:  [5 cmH20] 5 cmH20 Pressure Support:  [10 cmH20] 10 cmH20 Plateau Pressure:  [12 cmH20-20 cmH20] 18 cmH20   Intake/Output Summary (Last 24 hours) at 12/11/2017 1053 Last data filed at 12/11/2017 0700 Gross per 24 hour  Intake 579.53 ml  Output 3175 ml  Net -2595.47 ml   Filed Weights   12/08/17 1229 12/10/17 0253 12/11/17 0500  Weight: 65.5 kg 62 kg 58.9 kg    Examination: General: Misshapened child white female no significant change HEENT: Tracheal tube in place, orogastric tube in place Neuro: No significant change.  Poorly reactive to any  stimulation CV: Heart sounds are regular PULM: even/non-labored, lungs bilaterally managed in the base BM:WUXL, non-tender, bsx4 active, tube feeds via PEG Extremities: Contracted Skin: no rashes or lesions    Resolved Hospital Problem list    Assessment & Plan:  Cardiac Arrest , cardiogenic shock -resolved -Unknown etiology -Off pressors -Returning to baseline of poor neurological function.  Acute respiratory failure -Wean as tolerated for ventilatory support -Antimicrobial therapy  Cerebral Palsy, Spastic Quadriplegia, Dysphagia with PEG, Cognitive Impairment, Non-verbal, Cortical Blindness -Family remains optimistic on 12/11/2017 -Ongoing discussions about goals of care they want to take patient home if it is possible.  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  Labs   CBC: Mild reactive leukocytosis. Recent Labs  Lab 12/06/17 1112 12/06/17 1931  12/06/17 2303 12/07/17 0111 12/07/17 0504 12/09/17 0420 12/10/17 0416  WBC 10.4 10.2  --   --   --  7.7 12.9* 15.4*  HGB 12.3 11.7*   < > 11.9* 11.6* 11.8* 11.5* 11.2*  HCT  39.6 36.5   < > 35.0* 34.0* 36.1 33.0* 31.9*  MCV 96.8 93.4  --   --   --  90.7 85.3 85.1  PLT 133* 127*  --   --   --  96* 107* 132*   < > = values in this interval not displayed.   Basic Metabolic Panel: No significant  abnormalities today Recent Labs  Lab 12/08/17 0055 12/08/17 0505 12/08/17 0827 12/09/17 0420 12/10/17 0416  NA 144 145 145 146* 143  K 3.7 3.7 3.7 3.2* 3.5  CL 114* 116* 115* 114* 104  CO2 21* 24 23 26 29   GLUCOSE 134* 134* 124* 106* 89  BUN <5* <5* <5* 5* 8  CREATININE 0.81 0.75 0.78 0.76 0.72  CALCIUM 7.4* 7.6* 7.7* 7.4* 6.8*  MG  --   --   --  1.7 1.4*  PHOS  --   --   --  1.1* 4.5   GFR: Estimated Creatinine Clearance: 56.2 mL/min (by C-G formula based on SCr of 0.72 mg/dL). Recent Labs  Lab 12/05/17 0730  12/06/17 0719  12/06/17 1931 12/07/17 0504 12/09/17 0420 12/10/17 0416  WBC  --    < >  --    < > 10.2 7.7 12.9* 15.4*  LATICACIDVEN 2.0*  --  1.7  --   --   --   --   --    < > = values in this interval not displayed.   Liver Function Tests: Recent Labs  Lab 12/06/17 1112  AST 27  ALT 26  ALKPHOS 98  BILITOT 1.0  PROT 4.6*  ALBUMIN 1.9*   No results for input(s): LIPASE, AMYLASE in the last 168 hours. No results for input(s): AMMONIA in the last 168 hours. ABG    Component Value Date/Time   PHART 7.543 (H) 12/07/2017 1016   PCO2ART 21.0 (L) 12/07/2017 1016   PO2ART 130.0 (H) 12/07/2017 1016   HCO3 18.7 (L) 12/07/2017 1016   TCO2 19 (L) 12/07/2017 1016   ACIDBASEDEF 4.0 (H) 12/07/2017 1016   O2SAT 100.0 12/07/2017 1016    Coagulation Profile: Remains therapeutic anti-coag Recent Labs  Lab 12/06/17 1112 12/06/17 1844 12/06/17 1931 12/07/17 0242  INR 2.35 2.44 2.44 2.71   Cardiac Enzymes: Improving troponin leak secondary to cardiac arrest Recent Labs  Lab 12/06/17 1931 12/07/17 0046 12/07/17 0644 12/07/17 1213  TROPONINI 0.69* 0.29* 0.22* 0.17*   HbA1C: No results found for: HGBA1C CBG: Acceptable glycemic control Recent Labs  Lab 12/10/17 0747 12/10/17 1134 12/10/17 1637 12/10/17 2002 12/11/17 0003  GLUCAP 88 104* 78 91 91   12/17/2017, 19/19/19: CT abdomen 12/07/07- eeg, CT head 12/06/17- CTA, CT abdomen  Micro  Data: Dec 17, 2017: urine and blood cultures pending 2017-12-17: MRSA PCR negative 12/06/2017 sputum culture>> few Pseudomonas aeruginosa and Candida topicalis pansensitive been on Zyvox   Antimicrobials:  Zosyn  App cct 30 min   Brett Canales Jaydon Soroka ACNP [facility] PCCM Pager [phone] till 1 pm If no answer page [phone] 12/11/2017, 10:54 AM

## 2017-12-12 ENCOUNTER — Inpatient Hospital Stay (HOSPITAL_COMMUNITY): Payer: Medicare Other

## 2017-12-12 DIAGNOSIS — G800 Spastic quadriplegic cerebral palsy: Secondary | ICD-10-CM

## 2017-12-12 DIAGNOSIS — K9413 Enterostomy malfunction: Secondary | ICD-10-CM

## 2017-12-12 DIAGNOSIS — Z515 Encounter for palliative care: Secondary | ICD-10-CM

## 2017-12-12 DIAGNOSIS — G808 Other cerebral palsy: Secondary | ICD-10-CM

## 2017-12-12 LAB — GLUCOSE, CAPILLARY
GLUCOSE-CAPILLARY: 89 mg/dL (ref 70–99)
GLUCOSE-CAPILLARY: 227 mg/dL — AB (ref 70–99)
Glucose-Capillary: 90 mg/dL (ref 70–99)
Glucose-Capillary: 97 mg/dL (ref 70–99)

## 2017-12-12 LAB — BASIC METABOLIC PANEL
Anion gap: 9 (ref 5–15)
BUN: 7 mg/dL (ref 6–20)
CO2: 29 mmol/L (ref 22–32)
CREATININE: 0.62 mg/dL (ref 0.44–1.00)
Calcium: 7.3 mg/dL — ABNORMAL LOW (ref 8.9–10.3)
Chloride: 104 mmol/L (ref 98–111)
GFR calc Af Amer: >60 mL/min (ref >60)
GFR calc non Af Amer: >60 mL/min (ref >60)
Glucose, Bld: 106 mg/dL — ABNORMAL HIGH (ref 70–99)
Potassium: 4.1 mmol/L (ref 3.5–5.1)
Sodium: 142 mmol/L (ref 135–145)

## 2017-12-12 LAB — CBC WITH DIFFERENTIAL/PLATELET
Basophils Absolute: 0 10*3/uL (ref 0.0–0.1)
Basophils Relative: 0 %
EOS ABS: 0.2 10*3/uL (ref 0.0–0.7)
Eosinophils Relative: 1 %
HCT: 31.9 % — ABNORMAL LOW (ref 36.0–46.0)
Hemoglobin: 11 g/dL — ABNORMAL LOW (ref 12.0–15.0)
Lymphocytes Relative: 18 %
Lymphs Abs: 2.8 10*3/uL (ref 0.7–4.0)
MCH: 29.3 pg (ref 26.0–34.0)
MCHC: 34.5 g/dL (ref 30.0–36.0)
MCV: 85.1 fL (ref 78.0–100.0)
Monocytes Absolute: 1.7 10*3/uL — ABNORMAL HIGH (ref 0.1–1.0)
Monocytes Relative: 11 %
NEUTROS PCT: 70 %
Neutro Abs: 10.7 10*3/uL — ABNORMAL HIGH (ref 1.7–7.7)
PLATELETS: 240 10*3/uL (ref 150–400)
RBC: 3.75 MIL/uL — AB (ref 3.87–5.11)
RDW: 12.3 % (ref 11.5–15.5)
WBC: 15.4 10*3/uL — AB (ref 4.0–10.5)

## 2017-12-12 LAB — PROTIME-INR
INR: 1.22
Prothrombin Time: 15.3 seconds — ABNORMAL HIGH (ref 11.4–15.2)

## 2017-12-12 LAB — MAGNESIUM: Magnesium: 1.6 mg/dL — ABNORMAL LOW (ref 1.7–2.4)

## 2017-12-12 LAB — PHOSPHORUS: Phosphorus: 2.7 mg/dL (ref 2.5–4.6)

## 2017-12-12 MED ORDER — VITAL AF 1.2 CAL PO LIQD
1000.0000 mL | ORAL | Status: DC
  Administered 2017-12-12 – 2017-12-13 (×2): 1000 mL

## 2017-12-12 MED ORDER — VITAL AF 1.2 CAL PO LIQD: 1000.0000 mL | ORAL | Status: DC

## 2017-12-12 NOTE — Plan of Care (Signed)
  Problem: Nutrition: Goal: Adequate nutrition will be maintained Outcome: Progressing   Problem: Elimination: Goal: Will not experience complications related to bowel motility Outcome: Progressing   Problem: Safety: Goal: Ability to remain free from injury will improve Outcome: Progressing   Problem: Skin Integrity: Goal: Risk for impaired skin integrity will decrease Outcome: Progressing   

## 2017-12-12 NOTE — Progress Notes (Addendum)
Nutrition Follow Up  DOCUMENTATION CODES:   Not applicable  INTERVENTION:    Increase Vital AF 1.2 to goal rate of 45 ml/hr  Provides 1296 kcals, 81 gm protein, 876 ml of free water daily  NUTRITION DIAGNOSIS:   Inadequate oral intake related to inability to eat as evidenced by NPO status(home TF via G/J tube), ongoing  GOAL:   Patient will meet greater than or equal to 90% of their needs, currently unmet  MONITOR:   Vent status, TF tolerance, Labs, Weight trends, Skin, I & O's  ASSESSMENT:   27 yo female with PMH of cerebral palsy, intellectual disability, severe spastic quadriparesis, cortical blindness, dysphagia, G/J tube, and seizures who was admitted with sepsis, PNA, questionable aspiration.  9/17 G-J tube exchange per IR  Patient is currently intubated on ventilator support MV: 6.4 L/min Temp (24hrs), Avg:99.3 F (37.4 C), Min:98.6 F (37 C), Max:99.6 F (37.6 C)  She is s/p cardiac arrest 9/21. Transferred to the ICU. Vital AF 1.2 formula currently infusing at 30 ml/hr via G-J tube. CCM note reviewed. Pt is making some neurological improvements.  Palliative Medicine Team note 9/26 reviewed. Family leaning away from trach placement. Labs & medications reviewed. Mg 1.6 (L). CBG's 110-89-90.  RD spoke with Dr. Denese Killings regarding nutrition care plan. Will increase pt's TF regimen to goal rate at this time.  Diet Order:   Diet Order            Diet NPO time specified  Diet effective now             EDUCATION NEEDS:   No education needs have been identified at this time  Skin:  Skin Assessment: Reviewed RN Assessment  Last BM:  9/27   Intake/Output Summary (Last 24 hours) at 12/12/2017 1140 Last data filed at 12/12/2017 1000 Gross per 24 hour  Intake 1513.55 ml  Output 3115 ml  Net -1601.45 ml   Height:   Ht Readings from Last 1 Encounters:  12/08/17 4' (1.219 m)   Weight:   Wt Readings from Last 1 Encounters:  12/12/17 56.1 kg    Estimated Nutritional Needs:   Kcal:  1100-1300  Protein:  75-90 gm  Fluid:  per MD  Maureen Chatters, RD, LDN Pager #: [phone] After-Hours Pager #: [phone]

## 2017-12-12 NOTE — Progress Notes (Signed)
eLink Physician-Brief Progress Note Patient Name: Amber Curry DOB: July 13, 1990 MRN: [id number]   Date of Service  12/12/2017  HPI/Events of Note  Small amounts of blood from G-tube, Hemoglobin check was stable  eICU Interventions  Monitor patient and hemoglobin closely,check PT, INR        Holdan Stucke U Kaiden [place] 12/12/2017, 5:43 AM

## 2017-12-12 NOTE — Progress Notes (Signed)
RT NOTE: RT spoke with MD regarding patient's VT on ventilator. PER MD Dr. Denese Killings patient can stay on PS/CPAP mode as long as she tolerates as she is more comfortable setting her own volumes. If patient has to be returned to full support VT is to be set at 250. Vitals are stable. RT will continue to monitor.

## 2017-12-12 NOTE — Progress Notes (Signed)
NAME:  Amber Curry, MRN:  [id number], DOB:  1990/08/15, LOS: 9 ADMISSION DATE:  11/21/2017, CONSULTATION DATE:  12/06/17 REFERRING MD:  Hospitalist CHIEF COMPLAINT:  Cardiac arrest   Brief History   27 y.o female, s/p cardiac arrest on the floor 9/21 , transferred to the ICU. 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  Admitted for decreased responsiveness and generally not appearing herself according to her parents following a routine G-tube change on 9/17.  Was treated for a presumptive diagnosis of sepsis due to aspiration.  She sustained cardiac arrest on 9/21 and underwent therapeutic hypothermia.  Has been limited neurological recovery since then.  Procedures (surgical and bedside):  12/06/17: intubation, cpr 9/17 exchange of GJ tube  Subjective:  She is still not as alert as her baseline according to the family.  She still appears tachypneic on PSV 5/5.   Objective   Blood pressure 122/89, pulse (!) 110, temperature 99.6 F (37.6 C), temperature source Oral, resp. rate (!) 25, height 4' (1.219 m), weight 56.1 kg, SpO2 100 %. CVP:  [8 mmHg-12 mmHg] 12 mmHg  Vent Mode: PSV;CPAP FiO2 (%):  [40 %] 40 % Set Rate:  [12 bmp] 12 bmp Vt Set:  [350 mL] 350 mL PEEP:  [5 cmH20] 5 cmH20 Pressure Support:  [10 cmH20] 10 cmH20 Plateau Pressure:  [17 cmH20-22 cmH20] 17 cmH20   Intake/Output Summary (Last 24 hours) at 12/12/2017 1511 Last data filed at 12/12/2017 1500 Gross per 24 hour  Intake 1270.79 ml  Output 2120 ml  Net -849.21 ml   Filed Weights   12/11/17 0500 12/12/17 0020 12/12/17 0353  Weight: 58.9 kg 56.1 kg 56.1 kg    Examination: General: Small stature contractures  female no significant change HEENT: Tracheal tube in place, orogastric tube in place continues to drain  coffee-ground material Neuro: No significant change.  Poorly reactive to any stimulation CV: Heart sounds are regular PULM: even/non-labored, lungs bilaterally managed in the base WU:JWJX, non-tender, bsx4 active, tube feeds via PEG Extremities: Contracted Skin: no rashes or lesions    Resolved Hospital Problem list    Assessment & Plan:  Cardiac Arrest , cardiogenic shock -resolved -Unknown etiology -Off pressors -Slow  return to baseline of poor neurological function. -May not reach her previous baseline.  This becomes more likely as time goes on.  Acute respiratory failure -Wean as tolerated for ventilatory support -Antimicrobial therapy for 7 days  Cerebral Palsy, Spastic Quadriplegia, Dysphagia with PEG, Cognitive Impairment, Non-verbal, Cortical Blindness -Family remains optimistic on 12/11/2017 -Ongoing discussions about goals of care they want to take patient home if it is possible.  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  No seizure activity. 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  Labs   CBC: Mild reactive leukocytosis. Recent Labs  Lab 12/06/17 1931  12/07/17 0111 12/07/17 0504 12/09/17 0420 12/10/17 0416 12/12/17  0356  WBC 10.2  --   --  7.7 12.9* 15.4* 15.4*  NEUTROABS  --   --   --   --   --   --  10.7*  HGB 11.7*   < > 11.6* 11.8* 11.5* 11.2* 11.0*  HCT 36.5   < > 34.0* 36.1 33.0* 31.9* 31.9*  MCV 93.4  --   --  90.7  85.3 85.1 85.1  PLT 127*  --   --  96* 107* 132* 240   < > = values in this interval not displayed.   Basic Metabolic Panel: No significant abnormalities today Recent Labs  Lab 12/08/17 0827 12/09/17 0420 12/10/17 0416 12/11/17 1003 12/12/17 0356  NA 145 146* 143 141 142  K 3.7 3.2* 3.5 2.3* 4.1  CL 115* 114* 104 99 104  CO2 23 26 29 30 29   GLUCOSE 124* 106* 89 130* 106*  BUN <5* 5* 8 5* 7  CREATININE 0.78 0.76 0.72 0.61 0.62  CALCIUM 7.7* 7.4* 6.8* 7.1* 7.3*  MG  --  1.7 1.4*  --  1.6*  PHOS  --  1.1* 4.5  --  2.7   GFR: Estimated Creatinine Clearance: 54.4 mL/min (by C-G formula based on SCr of 0.62 mg/dL). Recent Labs  Lab 12/06/17 0719  12/07/17 0504 12/09/17 0420 12/10/17 0416 12/12/17 0356  WBC  --    < > 7.7 12.9* 15.4* 15.4*  LATICACIDVEN 1.7  --   --   --   --   --    < > = values in this interval not displayed.   Liver Function Tests: Recent Labs  Lab 12/06/17 1112  AST 27  ALT 26  ALKPHOS 98  BILITOT 1.0  PROT 4.6*  ALBUMIN 1.9*   No results for input(s): LIPASE, AMYLASE in the last 168 hours. No results for input(s): AMMONIA in the last 168 hours. ABG    Component Value Date/Time   PHART 7.543 (H) 12/07/2017 1016   PCO2ART 21.0 (L) 12/07/2017 1016   PO2ART 130.0 (H) 12/07/2017 1016   HCO3 18.7 (L) 12/07/2017 1016   TCO2 19 (L) 12/07/2017 1016   ACIDBASEDEF 4.0 (H) 12/07/2017 1016   O2SAT 100.0 12/07/2017 1016    Coagulation Profile: Remains therapeutic anti-coag Recent Labs  Lab 12/06/17 1112 12/06/17 1844 12/06/17 1931 12/07/17 0242 12/12/17 0554  INR 2.35 2.44 2.44 2.71 1.22   Cardiac Enzymes: Improving troponin leak secondary to cardiac arrest Recent Labs  Lab 12/06/17 1931 12/07/17 0046 12/07/17 0644 12/07/17 1213  TROPONINI 0.69* 0.29* 0.22* 0.17*   HbA1C: No results found for: HGBA1C CBG: Acceptable glycemic control Recent Labs  Lab 12/11/17 1950 12/11/17 2324 12/12/17 0400 12/12/17 0740 12/12/17 1252  GLUCAP  95 110* 89 90 227*   12-15-17, 19/19/19: CT abdomen 12/07/07- eeg, CT head 12/06/17- CTA, CT abdomen  Micro Data: Dec 15, 2017: urine and blood cultures pending 15-Dec-2017: MRSA PCR negative 12/06/2017 sputum culture>> few Pseudomonas aeruginosa and Candida topicalis pansensitive been on Zyvox   Antimicrobials:  Zosyn  CRITICAL CARE Performed by: Lynnell Catalan   Total critical care time: 30 minutes  Critical care time was exclusive of separately billable procedures and treating other patients.  Critical care was necessary to treat or prevent imminent or life-threatening deterioration.  Critical care was time spent personally by me on the following activities: development of treatment plan with patient and/or surrogate as well as nursing, discussions with consultants, evaluation of patient's response to treatment, examination of patient, obtaining history from patient or  surrogate, ordering and performing treatments and interventions, ordering and review of laboratory studies, ordering and review of radiographic studies, pulse oximetry and re-evaluation of patient's condition.  Lynnell Catalan, MD [facility] ICU Physician [facility] [place] [facility]  Pager: [phone] Mobile: [phone] After hours: [phone].

## 2017-12-12 NOTE — Progress Notes (Signed)
Daily Progress Note   Patient Name: Amber Curry       Date: 12/12/2017 DOB: 03-Dec-1990  Age: 27 y.o. MRN#: [id number] Attending Physician: Kipp Brood, MD Primary Care Physician: Karleen Hampshire., MD Admit Date: 11/20/2017  Reason for Consultation/Follow-up: Establishing goals of care  Subjective/Assessment: Patient resting comfortably during my visit. No s/s of distress. Currently weaning on ventilator. Personal caregiver at bedside but no family available.    Patient Profile/HPI:  27 year old with cerebral palsy, quadriparesis, cortical blindness, dysphagia seizure disorder has a chronic gastrojejunostomy tube. On chart review, it is noted that the patient went for a feeding tube change was discharged home, subsequently developed sweating was given Tylenol and was not feeling well and was brought to the hospital. Initially there was concern for her GJ tube not functioning well, she was seen by surgery and interventional radiology. She was admitted for sepsis and pneumonia.  On 12/06/2017 she suffered asystolic cardiac arrest.  She received 6 minutes of CPR and was resuscitated.  She required intubation.  Neurology evaluation revealed an anoxic brain injury.    Length of Stay: 9  Current Medications: Scheduled Meds:  . artificial tears  1 application Both Eyes L7J  . baclofen  10 mg Per Tube QHS  . baclofen  15 mg Per Tube Q1400  . baclofen  20 mg Per Tube Daily  . carBAMazepine  220 mg Per Tube BID   And  . carBAMazepine  240 mg Per Tube Daily  . chlorhexidine gluconate (MEDLINE KIT)  15 mL Mouth Rinse BID  . Chlorhexidine Gluconate Cloth  6 each Topical Q0600  . fentaNYL (SUBLIMAZE) injection  100 mcg Intravenous Once  . furosemide  40 mg Intravenous Daily  . heparin  5,000 Units  Subcutaneous Q8H  . iohexol  75 mL Intravenous Once  . levETIRAcetam  500 mg Per Tube Daily  . levETIRAcetam  750 mg Per Tube QHS  . mouth rinse  15 mL Mouth Rinse 10 times per day  . midazolam  2 mg Intravenous Once  . pantoprazole sodium  40 mg Per Tube Daily  . PHENObarbital  50 mg Per Tube BID  . sodium chloride flush  10-40 mL Intracatheter Q12H    Continuous Infusions: . sodium chloride 10 mL/hr at 12/11/17 0124  . sodium chloride 10 mL/hr  at 12/12/17 1500  . feeding supplement (VITAL AF 1.2 CAL) 1,000 mL (12/12/17 1247)  . piperacillin-tazobactam (ZOSYN)  IV 3.375 g (12/12/17 1555)    PRN Meds: sodium chloride, acetaminophen **OR** acetaminophen, albuterol, fentaNYL, Gerhardt's butt cream, metoprolol tartrate, midazolam, ondansetron **OR** ondansetron (ZOFRAN) IV, sodium chloride flush  Physical Exam        Child like young woman.  Intubated, swollen tongue, eyes closed CV tachycardic resp no obvious distress Abdomen soft Extremities flaccid with 1-2+ edema.   Vital Signs: BP 136/82   Pulse (!) 120   Temp 99.6 F (37.6 C) (Oral)   Resp (!) 29   Ht 4' (1.219 m)   Wt 56.1 kg   SpO2 100%   BMI 37.74 kg/m  SpO2: SpO2: 100 % O2 Device: O2 Device: Ventilator O2 Flow Rate: O2 Flow Rate (L/min): 3 L/min  Intake/output summary:   Intake/Output Summary (Last 24 hours) at 12/12/2017 1734 Last data filed at 12/12/2017 1500 Gross per 24 hour  Intake 1131.5 ml  Output 2020 ml  Net -888.5 ml   LBM: Last BM Date: 12/12/17 Baseline Weight: Weight: 47.6 kg Most recent weight: Weight: 56.1 kg       Palliative Assessment/Data: 10%    Flowsheet Rows     Most Recent Value  Intake Tab  Unit at Time of Referral  ER  Palliative Care Primary Diagnosis  Other (Comment)  Date Notified  12/12/2017  Palliative Care Type  New Palliative care  Reason for referral  Clarify Goals of Care  Date of Admission  11/19/2017  Date first seen by Palliative Care  12/04/17  # of days  Palliative referral response time  1 Day(s)  # of days IP prior to Palliative referral  0  Clinical Assessment  Psychosocial & Spiritual Assessment  Palliative Care Outcomes      Patient Active Problem List   Diagnosis Date Noted  . Palliative care encounter   . HCAP (healthcare-associated pneumonia)   . Respiratory failure ([place])   . Cardiac arrest ([place])   . Severe sepsis ([place]) 11/16/2017  . CP (cerebral palsy) ([place]) 11/30/2017  . Dysphagia, pharyngoesophageal phase 05/22/2017  . Gastrostomy tube dependent ([place]) 05/22/2017  . Drooling 02/07/2014  . 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 Microcephalus ([place]) 01/31/2013  . Congenital quadriplegia ([place]) 01/31/2013  . 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 Long-term use of high-risk medication 01/31/2013    Palliative Care Plan    Recommendations/Plan:  No family at bedside this afternoon. Called father, Bonney Roussel. No answer and vm not set up.   Continue current interventions.   PMT provider spoke in detail with patient's father on 9/26 regarding one-way extubation to comfort if she does not do well OR re-intubation and need for trach. Decisions not yet made. Will need ongoing support. Discussed with RN and Dr. Lynetta Mare.    Code Status:  Limited code  Prognosis:   Unable to determine.  Pending which direction is taken.  Patient is at risk for respiratory failure and imminent death without a trach as she may be unable to protect her airway.  She is at high risk for aspiration, infection, and poor quality of life with a trach.   Discharge Planning:  To Be Determined  Care plan was discussed with Dr. Lynetta Mare and RN  Thank you for allowing the Palliative Medicine Team to assist in the care of this patient.  Total time  spent:  73mn Time in:  1300 Time out:  1330    Greater than 50%  of this time was  spent counseling and coordinating care related to the above assessment and plan.  MIhor Dow FNP-C Palliative Medicine Team  Phone: [phone]: [phone] Please contact Palliative MedicineTeam phone at [phone] questions and concerns between 7 am - 7 pm.   Please see AMION for individual provider pager numbers.

## 2017-12-13 ENCOUNTER — Inpatient Hospital Stay (HOSPITAL_COMMUNITY): Payer: Medicare Other

## 2017-12-13 DIAGNOSIS — J9601 Acute respiratory failure with hypoxia: Secondary | ICD-10-CM

## 2017-12-13 DIAGNOSIS — J189 Pneumonia, unspecified organism: Secondary | ICD-10-CM

## 2017-12-13 DIAGNOSIS — J9811 Atelectasis: Secondary | ICD-10-CM

## 2017-12-13 LAB — BASIC METABOLIC PANEL
ANION GAP: 9 (ref 5–15)
BUN: 9 mg/dL (ref 6–20)
CALCIUM: 7 mg/dL — AB (ref 8.9–10.3)
CO2: 32 mmol/L (ref 22–32)
Chloride: 100 mmol/L (ref 98–111)
Creatinine, Ser: 0.5 mg/dL (ref 0.44–1.00)
Glucose, Bld: 109 mg/dL — ABNORMAL HIGH (ref 70–99)
Potassium: 2.4 mmol/L — CL (ref 3.5–5.1)
SODIUM: 141 mmol/L (ref 135–145)

## 2017-12-13 LAB — GLUCOSE, CAPILLARY
GLUCOSE-CAPILLARY: 90 mg/dL (ref 70–99)
GLUCOSE-CAPILLARY: 109 mg/dL — AB (ref 70–99)
Glucose-Capillary: 102 mg/dL — ABNORMAL HIGH (ref 70–99)
Glucose-Capillary: 109 mg/dL — ABNORMAL HIGH (ref 70–99)
Glucose-Capillary: 113 mg/dL — ABNORMAL HIGH (ref 70–99)
Glucose-Capillary: 80 mg/dL (ref 70–99)
Glucose-Capillary: 96 mg/dL (ref 70–99)

## 2017-12-13 LAB — CBC
HCT: 31.7 % — ABNORMAL LOW (ref 36.0–46.0)
Hemoglobin: 10.4 g/dL — ABNORMAL LOW (ref 12.0–15.0)
MCH: 29.5 pg (ref 26.0–34.0)
MCHC: 32.8 g/dL (ref 30.0–36.0)
MCV: 90.1 fL (ref 78.0–100.0)
PLATELETS: 307 10*3/uL (ref 150–400)
RBC: 3.52 MIL/uL — ABNORMAL LOW (ref 3.87–5.11)
RDW: 13 % (ref 11.5–15.5)
WBC: 17.3 10*3/uL — ABNORMAL HIGH (ref 4.0–10.5)

## 2017-12-13 LAB — PROCALCITONIN: Procalcitonin: 0.36 ng/mL

## 2017-12-13 MED ORDER — POTASSIUM CHLORIDE 10 MEQ/50ML IV SOLN
10.0000 meq | INTRAVENOUS | Status: AC
  Administered 2017-12-13 (×3): 10 meq via INTRAVENOUS
  Filled 2017-12-13 (×3): qty 50

## 2017-12-13 MED ORDER — POTASSIUM CHLORIDE 20 MEQ/15ML (10%) PO SOLN
40.0000 meq | ORAL | Status: AC
  Administered 2017-12-13 (×3): 40 meq
  Filled 2017-12-13 (×3): qty 30

## 2017-12-13 MED ORDER — MAGNESIUM SULFATE 2 GM/50ML IV SOLN
2.0000 g | Freq: Once | INTRAVENOUS | Status: AC
  Administered 2017-12-13: 2 g via INTRAVENOUS
  Filled 2017-12-13: qty 50

## 2017-12-13 NOTE — Plan of Care (Signed)
CRITICAL VALUE ALERT  Critical Value:  K 2.4  Date & Time Notied:  12/13/2017 3244  Provider Notified: Vassie Loll MD  Orders Received/Actions taken: see new orders

## 2017-12-13 NOTE — Progress Notes (Signed)
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 She sustained cardiac arrest on 9/21 and underwent therapeutic hypothermia  Course since then has been completed by encephalopathy with worse mental status than baseline with burst suppression pattern on initial EEG and known seizure disorder  She is slowly becoming more responsive with eye opening and is tolerating spontaneous breathing trial although with low tidal volumes. Father and caregiver at bedside today. Febrile to 101 overnight, good urine output. On exam-does not follow commands, S1-S2 distant, decreased breath sounds bilateral, 1+ bipedal edema, soft and mildly distended abdomen, dark brown output via OG tube.  Labs reviewed which show severe hypokalemia, mild hypomagnesemia and mild increasing leukocytosis. Chest x-ray personally reviewed which shows right lower lobe atelectasis versus infiltrate with volume loss.  Impression/plan  Acute respiratory failure post cardiac arrest-continue spontaneous breathing trials again even if she tolerates pressure support 5/5 doubt she would be able to maintain her airway post extubation. Ongoing discussion with family about tracheostomy.  Right lower lobe atelectasis-continue chest PT and keep right side up, repeat chest x-ray in a.m.  Seizure disorder-Keppra, Tegretol and phenobarbital neurology, MRI does not show any unfavorable prognostic features compared to baseline.  Cardiac arrest-no clear etiology but may be related to aspiration. Severe hypokalemia will be repleted aggressively  New fevers/mild leukocytosis-may be related to right lower lobe atelectasis, completed Zosyn for 7 days for pansensitive Pseudomonas and tracheal  aspirate, MRSA PCR negative. If WBC rises, then will add empiric cefepime.  Ongoing discussion with family, father seems to be realistic about outcome and prefers not to have tracheostomy, mother on the other hand is unable to let go, and wants her child home Decision here would be tracheostomy or not  My independent critical care time  X 31m  Cyril Mourning MD. FCCP.  Pulmonary & Critical care Pager [phone] If no response call [phone]   12/13/2017

## 2017-12-13 NOTE — Progress Notes (Signed)
Sputum culture collected. Sent to lab.  

## 2017-12-14 ENCOUNTER — Inpatient Hospital Stay (HOSPITAL_COMMUNITY): Payer: Medicare Other

## 2017-12-14 DIAGNOSIS — J9811 Atelectasis: Secondary | ICD-10-CM

## 2017-12-14 LAB — CBC
HCT: 30 % — ABNORMAL LOW (ref 36.0–46.0)
Hemoglobin: 9.9 g/dL — ABNORMAL LOW (ref 12.0–15.0)
MCH: 29.5 pg (ref 26.0–34.0)
MCHC: 33 g/dL (ref 30.0–36.0)
MCV: 89.3 fL (ref 78.0–100.0)
PLATELETS: 371 10*3/uL (ref 150–400)
RBC: 3.36 MIL/uL — AB (ref 3.87–5.11)
RDW: 12.8 % (ref 11.5–15.5)
WBC: 17 10*3/uL — ABNORMAL HIGH (ref 4.0–10.5)

## 2017-12-14 LAB — BASIC METABOLIC PANEL
Anion gap: 6 (ref 5–15)
BUN: 11 mg/dL (ref 6–20)
CO2: 33 mmol/L — ABNORMAL HIGH (ref 22–32)
CREATININE: 0.41 mg/dL — AB (ref 0.44–1.00)
Calcium: 7.7 mg/dL — ABNORMAL LOW (ref 8.9–10.3)
Chloride: 106 mmol/L (ref 98–111)
GFR calc Af Amer: >60 mL/min (ref >60)
Glucose, Bld: 104 mg/dL — ABNORMAL HIGH (ref 70–99)
POTASSIUM: 3.7 mmol/L (ref 3.5–5.1)
SODIUM: 145 mmol/L (ref 135–145)

## 2017-12-14 LAB — PROCALCITONIN: PROCALCITONIN: 0.43 ng/mL

## 2017-12-14 LAB — MAGNESIUM: MAGNESIUM: 2.1 mg/dL (ref 1.7–2.4)

## 2017-12-14 LAB — GLUCOSE, CAPILLARY
GLUCOSE-CAPILLARY: 84 mg/dL (ref 70–99)
Glucose-Capillary: 99 mg/dL (ref 70–99)
Glucose-Capillary: 106 mg/dL — ABNORMAL HIGH (ref 70–99)
Glucose-Capillary: 108 mg/dL — ABNORMAL HIGH (ref 70–99)

## 2017-12-14 LAB — URINE CULTURE: Culture: <10000 — AB

## 2017-12-14 LAB — PHOSPHORUS: PHOSPHORUS: 2.3 mg/dL — AB (ref 2.5–4.6)

## 2017-12-14 MED ORDER — FENTANYL CITRATE (PF) 100 MCG/2ML IJ SOLN
100.0000 ug | Freq: Once | INTRAMUSCULAR | Status: AC
  Administered 2017-12-14: 100 ug via INTRAVENOUS

## 2017-12-14 MED ORDER — ACETYLCYSTEINE 10% NICU INHALATION SOLUTION
4.0000 mL | Freq: Once | RESPIRATORY_TRACT | Status: DC
  Filled 2017-12-14: qty 4

## 2017-12-14 MED ORDER — FENTANYL CITRATE (PF) 100 MCG/2ML IJ SOLN
INTRAMUSCULAR | Status: AC
  Administered 2017-12-14: 100 ug via INTRAVENOUS
  Filled 2017-12-14: qty 2

## 2017-12-14 MED ORDER — ACETYLCYSTEINE 20 % IN SOLN
4.0000 mL | Freq: Once | RESPIRATORY_TRACT | Status: DC
  Filled 2017-12-14: qty 4

## 2017-12-14 MED ORDER — ACETYLCYSTEINE 10 % IN SOLN
4.0000 mL | Freq: Once | RESPIRATORY_TRACT | Status: DC
  Filled 2017-12-14: qty 4

## 2017-12-14 MED ORDER — POTASSIUM PHOSPHATES 15 MMOLE/5ML IV SOLN
30.0000 mmol | Freq: Once | INTRAVENOUS | Status: AC
  Administered 2017-12-14: 30 mmol via INTRAVENOUS
  Filled 2017-12-14: qty 10

## 2017-12-14 NOTE — Progress Notes (Signed)
Tube feedings turned off prior to bronch

## 2017-12-14 NOTE — Progress Notes (Signed)
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 She sustained cardiac arrest on 9/21 and underwent therapeutic hypothermia  Course -she had burst suppression pattern on initial EEG and known seizure disorder.  She remained encephalopathic for a while but had slow recovery to the point of opening eyes She is weaning and tolerating spontaneous breathing trials.  Came off pressors.  Low-grade febrile overnight. On exam-intermittent spontaneous eye opening, does not follow commands, S1-S2 distended, decreased breath sounds especially on right, 1+ bipedal edema, soft and mildly distended abdomen, dark black output from OG tube.  Labs show which show stable leukocytosis, hypokalemia has been corrected, mild hypophosphatemia.  Chest x-ray personally reviewed which shows increasing right atelectasis and volume loss.  Impression/plan  Acute respiratory failure post cardiac arrest-tolerating spontaneous breathing trials with low tidal volumes on pressure support 5/5  Right-sided atelectasis-tried conservative measures, given plan for extubation soon, will proceed with bronchoscopy for pulmonary toilet.  Continue chest PT and hypertonic saline nebs.  Cardiac arrest-no clear etiology, presumably related to the aspiration.  Seizure disorder-continue Tegretol phenobarbital, and Keppra per neurology.  MRI does not show any unfavorable prognostic features compared to baseline.  New fever/mild leukocytosis-most likely related to right atelectasis, she completed 7 days of Zosyn for pansensitive Pseudomonas on tracheal aspirate  Goals of care discussion with family-limited CODE STATUS issued.  Today on further discussion father agrees that we should not  do tracheostomy, mother is finally in agreement and does not want her to be on pain.  Given this would suggest one-way extubation once pulmonary status optimized over the next 24 to 48 hours.  My critical care time independent of procedures x 59m  Cyril Mourning MD. FCCP. [place] Pulmonary & Critical care Pager [phone] If no response call [phone]   12/14/2017

## 2017-12-14 NOTE — Procedures (Signed)
Bronchoscopy Procedure Note Amber Curry [id number] 12/05/1990  Procedure: Bronchoscopy Indications: Remove secretions , RLL atelectasis  Procedure Details Consent: Risks of procedure as well as the alternatives and risks of each were explained to the (patient/caregiver).  Consent for procedure obtained. Time Out: Verified patient identification, verified procedure, site/side was marked, verified correct patient position, special equipment/implants available, medications/allergies/relevent history reviewed, required imaging and test results available.  Performed  In preparation for procedure, patient was given 100% FiO2 and bronchoscope lubricated. Sedation: fentanyl 100 mcg in divided doses  Airway entered and the following bronchi were examined: Bronchi.   Procedures performed: White mucoid secretions removed from BL airways Bronchoscope removed.  , Patient placed back on 100% FiO2 at conclusion of procedure.    Evaluation Hemodynamic Status: BP stable throughout; O2 sats: stable throughout Patient's Current Condition: stable Specimens:  None Complications: No apparent complications Patient did tolerate procedure well.   Comer Locket Vassie Loll 12/14/2017

## 2017-12-14 NOTE — Progress Notes (Signed)
Assisted Dr. Vassie Loll with bronchoscopy. Pt placed on 100% prior to the procedure. Pt remained stable throughout. Pt will continue to be monitored in ICU.

## 2017-12-15 ENCOUNTER — Inpatient Hospital Stay (HOSPITAL_COMMUNITY): Payer: Medicare Other

## 2017-12-15 DIAGNOSIS — Z7189 Other specified counseling: Secondary | ICD-10-CM

## 2017-12-15 LAB — CBC
HEMATOCRIT: 28.9 % — AB (ref 36.0–46.0)
Hemoglobin: 9.4 g/dL — ABNORMAL LOW (ref 12.0–15.0)
MCH: 29.5 pg (ref 26.0–34.0)
MCHC: 32.5 g/dL (ref 30.0–36.0)
MCV: 90.6 fL (ref 78.0–100.0)
Platelets: 440 10*3/uL — ABNORMAL HIGH (ref 150–400)
RBC: 3.19 MIL/uL — AB (ref 3.87–5.11)
RDW: 13.5 % (ref 11.5–15.5)
WBC: 18.4 10*3/uL — ABNORMAL HIGH (ref 4.0–10.5)

## 2017-12-15 LAB — GLUCOSE, CAPILLARY
GLUCOSE-CAPILLARY: 83 mg/dL (ref 70–99)
GLUCOSE-CAPILLARY: 106 mg/dL — AB (ref 70–99)
GLUCOSE-CAPILLARY: 342 mg/dL — AB (ref 70–99)
GLUCOSE-CAPILLARY: 90 mg/dL (ref 70–99)
Glucose-Capillary: 103 mg/dL — ABNORMAL HIGH (ref 70–99)
Glucose-Capillary: 105 mg/dL — ABNORMAL HIGH (ref 70–99)
Glucose-Capillary: 106 mg/dL — ABNORMAL HIGH (ref 70–99)
Glucose-Capillary: 110 mg/dL — ABNORMAL HIGH (ref 70–99)
Glucose-Capillary: 157 mg/dL — ABNORMAL HIGH (ref 70–99)
Glucose-Capillary: 84 mg/dL (ref 70–99)

## 2017-12-15 LAB — PHOSPHORUS: PHOSPHORUS: 2.3 mg/dL — AB (ref 2.5–4.6)

## 2017-12-15 LAB — CULTURE, RESPIRATORY W GRAM STAIN

## 2017-12-15 LAB — BASIC METABOLIC PANEL
Anion gap: 8 (ref 5–15)
BUN: 11 mg/dL (ref 6–20)
CALCIUM: 7.5 mg/dL — AB (ref 8.9–10.3)
CO2: 33 mmol/L — AB (ref 22–32)
Chloride: 100 mmol/L (ref 98–111)
Creatinine, Ser: 0.35 mg/dL — ABNORMAL LOW (ref 0.44–1.00)
GFR calc non Af Amer: >60 mL/min (ref >60)
Glucose, Bld: 123 mg/dL — ABNORMAL HIGH (ref 70–99)
Potassium: 3 mmol/L — ABNORMAL LOW (ref 3.5–5.1)
SODIUM: 141 mmol/L (ref 135–145)

## 2017-12-15 LAB — CULTURE, RESPIRATORY

## 2017-12-15 LAB — MAGNESIUM: MAGNESIUM: 1.6 mg/dL — AB (ref 1.7–2.4)

## 2017-12-15 LAB — PROCALCITONIN: PROCALCITONIN: 0.26 ng/mL

## 2017-12-15 MED ORDER — MORPHINE 100MG IN NS 100ML (1MG/ML) PREMIX INFUSION
10.0000 mg/h | INTRAVENOUS | Status: DC
  Administered 2017-12-15 – 2017-12-16 (×2): 10 mg/h via INTRAVENOUS
  Filled 2017-12-15 (×2): qty 100

## 2017-12-15 MED ORDER — ORAL CARE MOUTH RINSE
15.0000 mL | Freq: Two times a day (BID) | OROMUCOSAL | Status: DC
  Administered 2017-12-15 – 2017-12-16 (×2): 15 mL via OROMUCOSAL

## 2017-12-15 MED ORDER — K PHOS MONO-SOD PHOS DI & MONO 155-852-130 MG PO TABS
500.0000 mg | ORAL_TABLET | Freq: Three times a day (TID) | ORAL | Status: DC
  Administered 2017-12-15 (×3): 500 mg via ORAL
  Filled 2017-12-15 (×6): qty 2

## 2017-12-15 MED ORDER — MAGNESIUM SULFATE 50 % IJ SOLN
3.0000 g | Freq: Once | INTRAVENOUS | Status: DC
  Filled 2017-12-15: qty 6

## 2017-12-15 NOTE — Progress Notes (Signed)
I received a request from the nurse to visit with the patient and her family. I visited with the patient and her family, along with the family's Amber Curry. [name] told me he was going to remain with the family for a while. I told him that if he needs my support at any time, please let me know. I told him to notify the nurse and I would return as needed or requested.     12/15/17 1030  Clinical Encounter Type  Visited With Patient and family together  Visit Type Spiritual support  Referral From Nurse  Consult/Referral To Chaplain  Spiritual Encounters  Spiritual Needs Prayer;Emotional  Stress Factors  Patient Stress Factors None identified  Family Stress Factors Exhausted    Chaplain Dr Melvyn Novas

## 2017-12-15 NOTE — Progress Notes (Signed)
eLink Physician-Brief Progress Note Patient Name: Amber Curry DOB: 09-May-1990 MRN: [id number]   Date of Service  12/15/2017  HPI/Events of Note  K, Phos, Mg low  eICU Interventions  replace     Intervention Category Intermediate Interventions: Electrolyte abnormality - evaluation and management  Max Fickle 12/15/2017, 5:47 AM

## 2017-12-15 NOTE — Progress Notes (Signed)
Pt now experiencing tachypnea in low 40s as well as vomiting episodes. Morphine gtt was started at 2200 but pt remains tachypneic. MD paged, awaiting response. Will continue to monitor closely during this shift.

## 2017-12-15 NOTE — Progress Notes (Signed)
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 She sustained cardiac arrest on 9/21 and underwent therapeutic hypothermia  Course -she had burst suppression pattern on initial EEG and known seizure disorder.  She remained encephalopathic for a while but had slow recovery to the point of opening eyes She is weaning and tolerating spontaneous breathing trials.  Came off pressors.  Subjective: No events overnight, tolerated weaning this AM  Objective: General: Chronically ill appearing, NAD, weaning HEENT: [name]/AT, PERRL, no EOM and MMM Heart: RRR, Nl S1/S2 and -M/R/G Lung: Coarse BS diffusely Abdomen: Soft, NT, ND and +BS Ext: Contracted, -edema and -tenderness Skin: Intact but thin  Impression/plan  27 year old female with PMH of CP that is baseline non-verbal and not following any command that suffered a cardiac arrest presumably due to aspiration.  Now weaning but unable to ascertain ability to protect her airway given baseline mental status.  Respiratory failure Extubate now DNR status O2 via nasal cannula  Seizure disorder-continue Tegretol phenobarbital, and Keppra per neurology.  MRI does not show any unfavorable prognostic features compared to baseline.  New fever/mild leukocytosis-most likely related to right atelectasis, she completed 7 days of Zosyn for pansensitive Pseudomonas on tracheal aspirate.  BAL cultures pending  GOC: extensive discussion with family, informed that patient is weaning this AM but I am unable to ascertain airway protection.  Plan is to extubate now.  Monitor for airway protection, if unable to protect her airway then will start morphine.  DNR status confirmed.  The patient  is critically ill with multiple organ systems failure and requires high complexity decision making for assessment and support, frequent evaluation and titration of therapies, application of advanced monitoring technologies and extensive interpretation of multiple databases.   Critical Care Time devoted to patient care services described in this note is  37  Minutes. This time reflects time of care of this signee Dr Jennet Maduro. This critical care time does not reflect procedure time, or teaching time or supervisory time of PA/NP/Med student/Med Resident etc but could involve care discussion time.  Rush Farmer, M.D. [facility] Pulmonary/Critical Care Medicine. Pager: [phone]. After hours pager: [phone].  12/15/2017

## 2017-12-15 NOTE — Plan of Care (Signed)
  Problem: Pain Managment: Goal: General experience of comfort will improve Outcome: Progressing   Problem: Safety: Goal: Ability to remain free from injury will improve Outcome: Progressing   Problem: Skin Integrity: Goal: Risk for impaired skin integrity will decrease Outcome: Progressing   

## 2017-12-15 NOTE — Procedures (Signed)
Extubation Procedure Note  Patient Details:   Name: ALVITA FANA DOB: [birth year] MRN: [id number]   Airway Documentation:    Vent end date: 12/15/17 Vent end time: 0949   Evaluation  O2 sats: stable throughout Complications: No apparent complications Patient did tolerate procedure well. Bilateral Breath Sounds: Rhonchi, Diminished   No   Pt extubated to 2L [place] per MD order. Pt unable to speak and unable to cough but it was expected as this was a 1-way extubation with possibility of comfort care.   Carolan Shiver 12/15/2017, 9:51 AM

## 2017-12-16 DIAGNOSIS — J9600 Acute respiratory failure, unspecified whether with hypoxia or hypercapnia: Secondary | ICD-10-CM

## 2017-12-16 DIAGNOSIS — Z515 Encounter for palliative care: Secondary | ICD-10-CM

## 2017-12-16 LAB — GLUCOSE, CAPILLARY
GLUCOSE-CAPILLARY: 91 mg/dL (ref 70–99)
Glucose-Capillary: 101 mg/dL — ABNORMAL HIGH (ref 70–99)

## 2017-12-16 MED ORDER — MIDAZOLAM HCL 2 MG/2ML IJ SOLN
1.0000 mg | INTRAMUSCULAR | Status: DC | PRN
  Administered 2017-12-16: 1 mg via INTRAVENOUS
  Filled 2017-12-16: qty 2

## 2017-12-16 MED ORDER — GLYCOPYRROLATE 0.2 MG/ML IJ SOLN: 0.2000 mg | INTRAMUSCULAR | Status: DC | PRN

## 2017-12-16 MED ORDER — BIOTENE DRY MOUTH MT LIQD: 15.0000 mL | OROMUCOSAL | Status: DC | PRN

## 2017-12-16 MED ORDER — MORPHINE BOLUS VIA INFUSION
2.0000 mg | INTRAVENOUS | Status: DC | PRN
  Administered 2017-12-16 (×2): 2 mg via INTRAVENOUS
  Administered 2017-12-16: 4 mg via INTRAVENOUS
  Administered 2017-12-16 (×3): 2 mg via INTRAVENOUS
  Filled 2017-12-16: qty 2

## 2017-12-16 MED ORDER — LORAZEPAM 2 MG/ML IJ SOLN
0.5000 mg | INTRAMUSCULAR | Status: DC | PRN
  Administered 2017-12-16: 1 mg via INTRAVENOUS
  Filled 2017-12-16: qty 1

## 2017-12-16 MED ORDER — GLYCOPYRROLATE 0.2 MG/ML IJ SOLN
0.4000 mg | Freq: Three times a day (TID) | INTRAMUSCULAR | Status: DC
  Administered 2017-12-16: 0.4 mg via INTRAVENOUS
  Filled 2017-12-16 (×2): qty 2

## 2017-12-16 MED ORDER — SODIUM CHLORIDE 0.9 % IV SOLN
1.0000 mg/h | INTRAVENOUS | Status: DC
  Administered 2017-12-16: 1 mg/h via INTRAVENOUS
  Administered 2017-12-16: 2 mg/h via INTRAVENOUS
  Filled 2017-12-16 (×2): qty 10

## 2017-12-16 MED ORDER — MORPHINE BOLUS VIA INFUSION: 2.0000 mg | INTRAVENOUS | Status: DC | PRN

## 2017-12-16 MED ORDER — SODIUM CHLORIDE 0.9 % IV SOLN
20.0000 mg/h | INTRAVENOUS | Status: DC
  Administered 2017-12-16 (×2): 20 mg/h via INTRAVENOUS
  Filled 2017-12-16: qty 10
  Filled 2017-12-16: qty 5
  Filled 2017-12-16 (×3): qty 10

## 2017-12-16 MED ORDER — POLYVINYL ALCOHOL 1.4 % OP SOLN
1.0000 [drp] | Freq: Four times a day (QID) | OPHTHALMIC | Status: DC | PRN
  Filled 2017-12-16: qty 15

## 2017-12-16 MED ORDER — MORPHINE BOLUS VIA INFUSION
2.0000 mg | INTRAVENOUS | Status: DC | PRN
  Filled 2017-12-16: qty 4

## 2017-12-16 MED ORDER — SODIUM CHLORIDE 0.9% FLUSH: 3.0000 mL | INTRAVENOUS | Status: DC | PRN

## 2017-12-16 MED ORDER — HALOPERIDOL 0.5 MG PO TABS
0.5000 mg | ORAL_TABLET | ORAL | Status: DC | PRN
  Filled 2017-12-16: qty 1

## 2017-12-16 MED ORDER — PROMETHAZINE HCL 25 MG/ML IJ SOLN
12.5000 mg | INTRAMUSCULAR | Status: DC | PRN
  Administered 2017-12-16: 12.5 mg via INTRAVENOUS
  Filled 2017-12-16: qty 1

## 2017-12-16 MED ORDER — HALOPERIDOL LACTATE 5 MG/ML IJ SOLN
0.5000 mg | INTRAMUSCULAR | Status: DC | PRN
  Administered 2017-12-16: 0.5 mg via INTRAVENOUS
  Filled 2017-12-16: qty 1

## 2017-12-16 MED ORDER — HALOPERIDOL LACTATE 2 MG/ML PO CONC
0.5000 mg | ORAL | Status: DC | PRN
  Filled 2017-12-16: qty 0.3

## 2017-12-16 MED ORDER — SODIUM CHLORIDE 0.9% FLUSH
3.0000 mL | Freq: Two times a day (BID) | INTRAVENOUS | Status: DC
  Administered 2017-12-16: 3 mL via INTRAVENOUS

## 2017-12-16 MED ORDER — GLYCOPYRROLATE 0.2 MG/ML IJ SOLN
0.2000 mg | INTRAMUSCULAR | Status: DC | PRN
  Administered 2017-12-16: 0.2 mg via INTRAVENOUS
  Filled 2017-12-16: qty 1

## 2017-12-16 MED ORDER — SODIUM CHLORIDE 0.9 % IV SOLN: 250.0000 mL | INTRAVENOUS | Status: DC | PRN

## 2017-12-16 MED ORDER — GLYCOPYRROLATE 1 MG PO TABS
1.0000 mg | ORAL_TABLET | ORAL | Status: DC | PRN
  Filled 2017-12-16: qty 1

## 2017-12-16 DEATH — deceased

## 2017-12-18 ENCOUNTER — Telehealth: Payer: Self-pay

## 2017-12-18 LAB — CULTURE, BLOOD (ROUTINE X 2)
CULTURE: NO GROWTH
Culture: NO GROWTH
Special Requests: ADEQUATE
Special Requests: ADEQUATE

## 2017-12-18 NOTE — Telephone Encounter (Signed)
On 12/18/17 Received d/c from [facility].  D/C is for burial. Patient is a patient of Doctor Molli Knock.  D/c Will be taken to 2 [facility] for signature.  On 12/19/17 I received the d/c back from Doctor Molli Knock.  I got the d/c ready and called the funeral home to let them know the d/c is ready for pickup.

## 2018-01-16 NOTE — Progress Notes (Signed)
Talked with bedside RN.  Sat with patient's father for approximately 30 min.  He understands that she aspirated last night and that she is dying.  He is emotionally distraught.  He questioned whether or not he has made the correct decisions for her.  I attempted to re-assure him.  I believe for 27 years she has been the center of his world.  Patient with increased work of breathing, lung sounds are coarse and wet.  Otherwise she appears relatively comfortable - however she would likely not show the normal signs of distress.  We only have her respirations and heart rate to tell us if she is uncomfortable.  Pulse currently in the 130s.   Recommendations:   Patient is full comfort.    Will put in "end of life" order set.   Schedule robinul.  Increase frequency of PRN morphine bolus.  Encourage use of PRNs.  Chaplain requested.  Patient is receiving excellent RN care.  Prognosis:  Hours to days. Disposition:  Hospital death  Norvel Richards, [place] [facility] Pager: [phone]   Time 35 min.

## 2018-01-16 NOTE — Progress Notes (Signed)
Chaplain responded to spiritual consult. Patient is receiving comfort care. Chaplain encountered mother in waiting area, and expressed she did not want her daughter to die. She received comfort as well from doctor, and another friend while chaplain was there. Mother said she wished for chaplain to go pray for her daughter but "did not want to see her like this."  Chaplain met father bedside and offered scripture and prayer.  Chaplain informed nurse she had another end of life to attend, but would try to either be available or she could contact the on-call chaplain in the PM for continued family support. Tamsen Snider [phone]  Pager

## 2018-01-16 NOTE — Progress Notes (Signed)
   24-Dec-2017 1100  Clinical Encounter Type  Visited With Patient and family together  Visit Type Follow-up  Referral From Nurse  Consult/Referral To Chaplain  Spiritual Encounters  Spiritual Needs Prayer;Grief support  Met with nurse and she indicated that patient is at end of life. I went in and met with family and provided grief/emotional support with prayer.

## 2018-01-16 NOTE — Progress Notes (Signed)
   Dec 19, 2017 1900  Clinical Encounter Type  Visited With Health care provider;Family  Visit Type Death  Referral From Nurse  Spiritual Encounters  Spiritual Needs Grief support;Prayer  Stress Factors  Family Stress Factors Major life changes;Exhausted   Responded to page request for family support after death of patient.  Provided support to father, brother, aunts, great-aunts, god-sister, grandmother, and other loved ones.  Prayed with family around deceased patient.  Funeral home information provided directly to care RN from pt's grandmother.  Chaplain remains available if further support is desired.  Margretta Sidle resident, [phone]

## 2018-01-16 NOTE — Progress Notes (Signed)
20 ml of Versed and 10 mL of Morphine wasted with Zenaida Niece, RN at 2010.

## 2018-01-16 NOTE — Progress Notes (Signed)
Nutrition Brief Note  Chart reviewed. Pt transitioned to comfort/end of life care.  No further nutrition interventions warranted at this time.  Please re-consult as needed.   Maureen Chatters, RD, LDN Pager #: [phone] After-Hours Pager #: [phone]

## 2018-01-16 NOTE — Progress Notes (Signed)
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 She sustained cardiac arrest on 9/21 and underwent therapeutic hypothermia  Course -she had burst suppression pattern on initial EEG and known seizure disorder.  She remained encephalopathic for a while but had slow recovery to the point of opening eyes She is weaning and tolerating spontaneous breathing trials.  Came off pressors.  Subjective: Aspirated overnight, increased WOB, tachycardia and now fluctuant SBP  Objective: General: Chronically ill appearing female, clearly in respiratory distress HEENT: /AT, PERRL, EOM-spontaneous, MMM Heart: Sinus tachy, Nl S1/S2 and -M/R/G Lung: Coarse BS diffusely, tachy Abdomen: Soft, NT, ND and +BS Ext: Contracted, -edema and -tenderness Skin: Intact but thin  Impression/plan  27 year old female with PMH of CP that is baseline non-verbal and not following any command that suffered a cardiac arrest presumably due to aspiration.  Patient is clearly dying at this point, on morphine drip after a large aspiration event overnight.  Remains DNR.  Respiratory failure: Proceed with comfort care Spoke with parents, they do not with for trach/peg/reintubation DNR status O2 via nasal cannula for comfort only at this point  Seizure disorder D/C PO interventions Versed drip for seizure and comfort  New fever/mild leukocytosis Tylenol for comfort  GOC:  Spoke with the family again, father is understandably sad but wants nothing but comfort for the patient.  Mother is crying and would like to keep the patient alive but when explained that the only option would be intubation and trach she quickly changed her mind to emphasizing  comfort.  The patient is critically ill with multiple organ systems failure and requires high complexity decision making for assessment and support, frequent evaluation and titration of therapies, application of advanced monitoring technologies and extensive interpretation of multiple databases.   Critical Care Time devoted to patient care services described in this note is  36  Minutes. This time reflects time of care of this signee Dr Koren Bound. This critical care time does not reflect procedure time, or teaching time or supervisory time of PA/NP/Med student/Med Resident etc but could involve care discussion time.  Alyson Reedy, M.D. [facility] Pulmonary/Critical Care Medicine. Pager: [phone]. After hours pager: [phone].  December 19, 2017

## 2018-01-16 NOTE — Progress Notes (Signed)
Pt HR in 150s, RR in 40s. Verbal order given by Lodema Hong, NP to bolus pt with 4mg  Morphine IV. Will continue to monitor pt this shift.

## 2018-01-16 NOTE — Death Summary Note (Signed)
DEATH SUMMARY   Patient Details  Name: Amber Curry MRN: [id number] DOB: 05/22/1990  Admission/Discharge Information   Admit Date:  2017/12/27  Date of Death: Date of Death: Jan 09, 2018  Time of Death: Time of Death: 08-16-1816  Length of Stay: August 22, 2022  Referring Physician: Laqueta Due., MD   Reason(s) for Hospitalization  Pulseless electrical activity cardiac arrest  Diagnoses  Preliminary cause of death:   Pulseless electrical activity cardiac arrest Secondary Diagnoses (including complications and co-morbidities):  Principal Problem:   Sepsis (HCC) Active Problems:   Severe intellectual disabilities   Congenital quadriplegia (HCC)   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)   Gastrostomy tube dependent (HCC)   CP (cerebral palsy) (HCC)   HCAP (healthcare-associated pneumonia)   Respiratory failure (HCC)   Cardiac arrest (HCC)   Goals of care, counseling/discussion   Palliative care by specialist   Jejunostomy malfunction (HCC)   Atelectasis of right lung   Acute respiratory failure (HCC)   Comfort measures only status   Palliative care encounter  Brief Hospital Course (including significant findings, care, treatment, and services provided and events leading to death)  27 year-old female with cerebral palsy complicated by intellectual disability, severe spastic quadriparesis, cortical blindness, dysphagia with GJT, and seizure disorderadmitted 2022-12-28 after routine GJ tube exchange with fevers and found to have localized perforation of colon and right lower lobe infiltrate. She sustained cardiac arrest on 9/21 and underwent therapeutic hypothermia  Patient is clearly dying at this point, on morphine drip after a large aspiration event overnight.  Remains DNR.  Spoke with the family again, father is understandably sad but wants nothing but comfort for the patient.  Mother is crying and would like to keep the patient alive but when explained that the only  option would be intubation and trach she quickly changed her mind to emphasizing comfort.  Started morphine and patient was extubated to expire shortly thereafter with the family bedside.  Pertinent Labs and Studies  Significant Diagnostic Studies Ct Abdomen Pelvis Wo Contrast  Result Date: 2017/12/27 CLINICAL DATA:  Gastrostomy tube change today. Generalized abdominal pain, fever, and night sweats. EXAM: CT ABDOMEN AND PELVIS WITHOUT CONTRAST TECHNIQUE: Multidetector CT imaging of the abdomen and pelvis was performed following the standard protocol without IV contrast. COMPARISON:  None. FINDINGS: Lower chest: Consolidation or atelectasis in the right lung base. Esophagus is dilated and fluid-filled, suggesting reflux or dysmotility. Hepatobiliary: No focal liver abnormality is seen. No gallstones, gallbladder wall thickening, or biliary dilatation. Pancreas: Unremarkable. No pancreatic ductal dilatation or surrounding inflammatory changes. Spleen: Normal in size without focal abnormality. Adrenals/Urinary Tract: Adrenal glands are unremarkable. Kidneys are normal, without renal calculi, focal lesion, or hydronephrosis. Bladder is unremarkable. Stomach/Bowel: Stomach, small bowel, and colon are not abnormally distended. A gastrojejunostomy tube is present. The distal tip is projecting outside of the bowel wall, likely tenting the bowel wall. There is gas collection in the upper epigastric region which may be within the decompressed stomach but it could also be extraluminal. Visualization of this area is somewhat difficult due to under distention of the stomach and motion artifact. Consider repeat imaging with water-soluble contrast material to exclude gastric perforation period no free intra-abdominal air is identified. Stomach, small bowel, and colon are not abnormally distended. Mild wall thickening suggested the sigmoid colon may indicate focal colitis. Residual contrast material is demonstrated  throughout the colon. The appendix is normal. Vascular/Lymphatic: No significant vascular findings are present. No enlarged abdominal or pelvic lymph nodes. Reproductive:  Uterus and bilateral adnexa are unremarkable. Other: Small amount of fluid in the pelvis is likely reactive or inflammatory. Infiltration in the mesentery is also probably inflammatory. No discrete abscess. Ventricular peritoneal shunt tubing is present with tip in the pelvis. Musculoskeletal: Postoperative changes in both hips and the left acetabulum. Lumbar scoliosis convex towards the left. IMPRESSION: 1. Consolidation or atelectasis in the right lung base. Possible pneumonia or aspiration. 2. Dilated and fluid-filled esophagus suggesting reflux or dysmotility. 3. Gastric jejunostomy tube appears to be in place. Nonspecific gas in the epigastric region could be within decompressed stomach or it may indicate localized perforation. Suggest repeat study with water-soluble bowel contrast material to exclude localized perforation. 4. Mild wall thickening of the sigmoid colon may indicate focal colitis. Small amount of free fluid in the pelvis is likely reactive or inflammatory. 5. Ventricular shunt tubing is present with tip in the pelvis. 6. Postoperative changes in the hips and left acetabulum. These results were called by telephone at the time of interpretation on January 02, 2018 at 5:42 am to Dr. Zadie Rhine , who verbally acknowledged these results. Electronically Signed   By: Burman Nieves M.D.   On: Jan 02, 2018 05:50   Dg Chest 1 View  Result Date: 12/06/2017 CLINICAL DATA:  Difficult intubation EXAM: CHEST  1 VIEW COMPARISON:  12/06/2017 FINDINGS: Endotracheal tube 2.2 cm above the carina. NG tube extends to the stomach. Right subclavian central line tip at the mid SVC level. Mediastinal drains noted. Defibrillator pads overlie the chest. Contrast within the bowel in the epigastric region. Low lung volumes persist. There is improved  aeration of the left lung following retraction of the endotracheal tube. Scattered airspace disease and atelectasis remains. Left effusion not excluded. No pneumothorax. IMPRESSION: Retraction of endotracheal tube, now 2.2 cm above the carina. Improved aeration of the left lung Persistent low lung volumes with scattered airspace disease and atelectasis Suspect left pleural effusion layering posteriorly No large pneumothorax Electronically Signed   By: Judie Petit.  Shick M.D.   On: 12/06/2017 10:56   Dg Abdomen 1 View  Result Date: 01/02/2018 CLINICAL DATA:  Tube placement EXAM: ABDOMEN - 1 VIEW COMPARISON:  CT abdomen and pelvis 01/02/18 FINDINGS: A small amount of contrast material was injected into the gastrojejunostomy tube. Contrast material fills the tube and a small amount of small bowel. The tube does appear to be in position. However, this does not resolve the issue of possible perforation at the level of the stomach. There are needs to be more contrast material instilled into the tube to try and reflux contrast back into the stomach and the imaging needs to include the stomach in lower chest. Consider obtaining an upper GI or tube injection under fluoroscopy to better evaluate for this. The mid abdominal gas collection seen at CT are also seen at plain radiograph and the appearance is suspicious for localized contained perforation. Residual contrast material is also noted in the colon. Ventricular peritoneal shunt tubing. Postoperative changes in the hips. IMPRESSION: Gastrostomy tube appears to be in satisfactory position within the jejunum. However, contrast material does not extend to the area of interest in the stomach and gas collections are present suggesting possible contained perforation. Consider an upper GI or tube injection under fluoroscopy to better evaluate for perforation. Electronically Signed   By: Burman Nieves M.D.   On: 01/02/2018 06:58   Ct Head Wo Contrast  Result Date:  12/06/2017 CLINICAL DATA:  Seizures.  Cerebral palsy. EXAM: CT HEAD WITHOUT CONTRAST TECHNIQUE: Contiguous axial  images were obtained from the base of the skull through the vertex without intravenous contrast. COMPARISON:  None. FINDINGS: Brain: Cerebral hemispheres are constrained by an abnormal scaphocephalic cranial vault. There is premature atrophy for age. The ventricles are decompressed by a LEFT occipital shunt catheter. Hypoplastic brainstem and cerebellum. Absent corpus callosum. Vascular: No hyperdense vessel. Skull: Marked scaphocephaly, query premature closure of the sagittal suture. Large burr hole placement in the LEFT occipital bone for the ventriculoperitoneal shunt catheter. No worrisome osseous lesion or fracture. Thickened calvarium, likely related to anticonvulsant use. Sinuses/Orbits: No acute finding. Other: No middle ear or mastoid fluid. The patient appears to be endotracheally intubated. IMPRESSION: Cerebral, cerebellar, and brainstem atrophy, likely congenital or longstanding. 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 Scaphocephalic skull constrains the cerebral hemispheres, likely premature fusion of the sagittal suture, but there is no fracture or worrisome osseous lesion. Ventricles are decompressed by a LEFT occipital shunt catheter. 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 Electronically Signed   By: Elsie Stain M.D.   On: 12/06/2017 17:05   Ct Angio Chest Pe W Or Wo Contrast  Result Date: 12/06/2017 CLINICAL DATA:  Status post cardiac arrest. History of seizures and cerebral palsy. EXAM: CT ANGIOGRAPHY CHEST CT ABDOMEN AND PELVIS WITH CONTRAST TECHNIQUE: Multidetector CT imaging of the chest was performed using the standard protocol during bolus administration of intravenous contrast. Multiplanar CT image reconstructions and MIPs were obtained to evaluate the vascular anatomy. Multidetector CT imaging of the  abdomen and pelvis was performed using the standard protocol during bolus administration of intravenous contrast. CONTRAST:  80mL ISOVUE-370 IOPAMIDOL (ISOVUE-370) INJECTION 76% COMPARISON:  Abdominopelvic CT 12/04/2017 and 12/06/17. Chest radiographs today. FINDINGS: CTA CHEST FINDINGS Cardiovascular: The pulmonary arteries are well opacified with contrast to the level of the subsegmental branches. There is no evidence of acute pulmonary embolism. The thoracic aorta and great vessels appear normal. Right arm PICC extends to the mid SVC. The heart size is normal. There is no pericardial effusion. Mediastinum/Nodes: There are no enlarged mediastinal, hilar or axillary lymph nodes. There is no evidence of mediastinal hematoma. Endotracheal and enteric tubes are in place. Lungs/Pleura: There are moderate size dependent pleural effusions bilaterally there is no pneumothorax. Dependent airspace opacities at both lung bases likely represent atelectasis. There are additional patchy airspace opacities in both upper lobes, likely pneumonia or hemorrhage. No lung mass or endobronchial lesion identified. Musculoskeletal/Chest wall: Generalized edema throughout the chest wall. No soft tissue mass or acute osseous findings are demonstrated. Review of the MIP images confirms the above findings. CT ABDOMEN AND PELVIS FINDINGS Hepatobiliary: The liver has a stable appearance without intrinsic abnormality. No evidence of gallstones, gallbladder wall thickening or biliary dilatation. Pancreas: Unremarkable. No pancreatic ductal dilatation or surrounding inflammatory changes. Spleen: Normal in size without focal abnormality. Adrenals/Urinary Tract: Both adrenal glands appear normal. No focal renal abnormalities are identified. There is no evidence of urinary tract calculus or hydronephrosis. The bladder appears normal. Stomach/Bowel: Enteric tube extends into the proximal stomach. There is a percutaneous gastrojejunostomy which  extends into the proximal jejunum. The stomach remains decompressed. There is no significant residual small or large bowel distension. There is mild diffuse small bowel wall thickening. Multiple extraluminal air bubbles are again noted within the upper abdomen, surrounding the stomach and gastrohepatic ligament. These have slightly decreased in overall volume over the last 2 days. There is no extravasated enteric contrast. Vascular/Lymphatic: There are  no enlarged abdominal or pelvic lymph nodes. No significant vascular findings. Reproductive: The uterus and ovaries appear normal. No adnexal mass. Other: Again demonstrated is moderate ascites. There is increased fluid in the right upper quadrant, inferior to the liver and gallbladder, measuring up to 11.0 x 4.0 cm on image 20/5. Other interloop and pelvic components of this ascites have not significantly changed. There are no well-defined or peripherally enhancing fluid collections. Ventricular peritoneal shunt tubing extends into the right pelvis. Musculoskeletal: No acute osseous findings. Thoracolumbar scoliosis, postsurgical changes in the left bony pelvis and proximal femurs again noted. There is generalized edema throughout the subcutaneous fat. Review of the MIP images confirms the above findings. IMPRESSION: 1. No evidence of acute pulmonary embolism. 2. Support system appears adequately positioned within the chest. 3. Moderate dependent bilateral pleural effusions with associated bibasilar atelectasis. Additional patchy airspace opacities in both upper lobes, suspicious for pneumonia or pulmonary hemorrhage. 4. Extraluminal gas in the upper abdomen, surrounding the stomach and gastrohepatic ligament has mildly improved. No extravasated enteric contrast is identified. 5. The volume of ascites has mildly increased, especially inferior to the liver. No well-defined or peripherally enhancing intra-abdominal fluid collections are seen to suggest abscess. 6.  Resolved small bowel distension consistent with improving ileus. Electronically Signed   By: Carey Bullocks M.D.   On: 12/06/2017 14:21   Mr Brain Wo Contrast  Result Date: 12/09/2017 CLINICAL DATA:  Status post cardiac arrest December 06, 2017. EEG demonstrating diffuse anoxic injury. History of cerebral palsy and spastic quadriplegia, microcephaly. EXAM: MRI HEAD WITHOUT CONTRAST TECHNIQUE: Multiplanar, multiecho pulse sequences of the brain and surrounding structures were obtained without intravenous contrast. COMPARISON:  CT HEAD December 06, 2017 FINDINGS: INTRACRANIAL CONTENTS: No reduced diffusion to suggest acute ischemia. No susceptibility artifact to suggest hemorrhage. LEFT ventriculostomy catheter via LEFT parietal approach, gliosis along the tract. The catheter does not appear to traverse the ventricles. Slit like parallel ventricles are unchanged. No hydrocephalus. Agenesis of the corpus callosum. Hypoplastic vermis with nearly aplastic cerebellar hemispheres. Microcephaly. Cystic changes within bilateral deep gray nuclei may be developmental or new natal infarcts. No abnormal extra-axial fluid collections. VASCULAR: Normal major intracranial vascular flow voids present at skull base. SKULL AND UPPER CERVICAL SPINE: No abnormal sellar expansion. No suspicious calvarial bone marrow signal. Craniocervical junction maintained. SINUSES/ORBITS: Moderate paranasal sinusitis. Bilateral mastoid effusions. Proptosis. Atrophic versus hypoplastic bilateral optic nerves. OTHER: None. IMPRESSION: 1. No acute intracranial process. 2. 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 3. LEFT ventriculoperitoneal shunt.  No hydrocephalus. Electronically Signed   By: Awilda Metro M.D.   On: 12/09/2017 22:57   Ct Abdomen Pelvis W Contrast  Result Date: 12/06/2017 CLINICAL DATA:  Status post cardiac arrest.  History of seizures and cerebral palsy. EXAM: CT ANGIOGRAPHY CHEST CT ABDOMEN AND PELVIS WITH CONTRAST TECHNIQUE: Multidetector CT imaging of the chest was performed using the standard protocol during bolus administration of intravenous contrast. Multiplanar CT image reconstructions and MIPs were obtained to evaluate the vascular anatomy. Multidetector CT imaging of the abdomen and pelvis was performed using the standard protocol during bolus administration of intravenous contrast. CONTRAST:  80mL ISOVUE-370 IOPAMIDOL (ISOVUE-370) INJECTION 76% COMPARISON:  Abdominopelvic CT 12/04/2017 and Dec 19, 2017. Chest radiographs today. FINDINGS: CTA CHEST FINDINGS Cardiovascular: The pulmonary arteries are well opacified with contrast to the level of the subsegmental branches. There is no evidence of acute pulmonary embolism. The thoracic aorta and great vessels appear normal. Right arm  PICC extends to the mid SVC. The heart size is normal. There is no pericardial effusion. Mediastinum/Nodes: There are no enlarged mediastinal, hilar or axillary lymph nodes. There is no evidence of mediastinal hematoma. Endotracheal and enteric tubes are in place. Lungs/Pleura: There are moderate size dependent pleural effusions bilaterally there is no pneumothorax. Dependent airspace opacities at both lung bases likely represent atelectasis. There are additional patchy airspace opacities in both upper lobes, likely pneumonia or hemorrhage. No lung mass or endobronchial lesion identified. Musculoskeletal/Chest wall: Generalized edema throughout the chest wall. No soft tissue mass or acute osseous findings are demonstrated. Review of the MIP images confirms the above findings. CT ABDOMEN AND PELVIS FINDINGS Hepatobiliary: The liver has a stable appearance without intrinsic abnormality. No evidence of gallstones, gallbladder wall thickening or biliary dilatation. Pancreas: Unremarkable. No pancreatic ductal dilatation or surrounding inflammatory  changes. Spleen: Normal in size without focal abnormality. Adrenals/Urinary Tract: Both adrenal glands appear normal. No focal renal abnormalities are identified. There is no evidence of urinary tract calculus or hydronephrosis. The bladder appears normal. Stomach/Bowel: Enteric tube extends into the proximal stomach. There is a percutaneous gastrojejunostomy which extends into the proximal jejunum. The stomach remains decompressed. There is no significant residual small or large bowel distension. There is mild diffuse small bowel wall thickening. Multiple extraluminal air bubbles are again noted within the upper abdomen, surrounding the stomach and gastrohepatic ligament. These have slightly decreased in overall volume over the last 2 days. There is no extravasated enteric contrast. Vascular/Lymphatic: There are no enlarged abdominal or pelvic lymph nodes. No significant vascular findings. Reproductive: The uterus and ovaries appear normal. No adnexal mass. Other: Again demonstrated is moderate ascites. There is increased fluid in the right upper quadrant, inferior to the liver and gallbladder, measuring up to 11.0 x 4.0 cm on image 20/5. Other interloop and pelvic components of this ascites have not significantly changed. There are no well-defined or peripherally enhancing fluid collections. Ventricular peritoneal shunt tubing extends into the right pelvis. Musculoskeletal: No acute osseous findings. Thoracolumbar scoliosis, postsurgical changes in the left bony pelvis and proximal femurs again noted. There is generalized edema throughout the subcutaneous fat. Review of the MIP images confirms the above findings. IMPRESSION: 1. No evidence of acute pulmonary embolism. 2. Support system appears adequately positioned within the chest. 3. Moderate dependent bilateral pleural effusions with associated bibasilar atelectasis. Additional patchy airspace opacities in both upper lobes, suspicious for pneumonia or pulmonary  hemorrhage. 4. Extraluminal gas in the upper abdomen, surrounding the stomach and gastrohepatic ligament has mildly improved. No extravasated enteric contrast is identified. 5. The volume of ascites has mildly increased, especially inferior to the liver. No well-defined or peripherally enhancing intra-abdominal fluid collections are seen to suggest abscess. 6. Resolved small bowel distension consistent with improving ileus. Electronically Signed   By: Carey Bullocks M.D.   On: 12/06/2017 14:21   Ct Abdomen Pelvis W Contrast  Result Date: 12/04/2017 CLINICAL DATA:  Fever.  Recent G  tube exchange EXAM: CT ABDOMEN AND PELVIS WITH CONTRAST TECHNIQUE: Multidetector CT imaging of the abdomen and pelvis was performed using the standard protocol following bolus administration of intravenous contrast. CONTRAST:  <See Chart> ISOVUE-300 IOPAMIDOL (ISOVUE-300) INJECTION 61%, <See Chart> OMNIPAQUE IOHEXOL 300 MG/ML SOLN COMPARISON:  G-tube evaluation 2017-12-25, CT 12-25-2017 FINDINGS: Lower chest: Bilateral pleural effusions and basilar atelectasis. Atelectasis dense. Hepatobiliary: No focal hepatic lesion. No biliary duct dilatation. Gallbladder normal. Common bile duct normal. Pancreas: Grossly normal.  No inflammation or fluid collections. Spleen:  Normal spleen Adrenals/urinary tract: Adrenal glands normal. There is hypoattenuation within the cortex of the LEFT kidney (image 21/9 which may be artifact from the adjacent high density barium within the bowel. Ureters and bladder normal. Stomach/Bowel: Percutaneous gastrostomy tube with bulb within the gastric lumen. The J arm extension into the proximal small bowel. There is persistent gas surrounding the junction of the gastric antrum and duodenal bulb (image 27-21 of series 3). Similar to comparison CT exam. There is extraluminal gas position between the stomach and the liver in the gastrohepatic ligament (image 18/9). This is also similar to comparison exam. Of note,  there is no evidence of leak of the injected CT contrast. There is a moderate volume of free fluid in mesentery and pelvis which is low-density. The contrast injected contrast flows distally in the small bowel. There is some thickening of the second portion the duodenum without obstruction. Contrast flows the RIGHT colon and LEFT colon and rectum. . Vascular/Lymphatic: Abdominal aortic normal caliber. No retroperitoneal periportal lymphadenopathy. Musculoskeletal: No aggressive osseous lesion IMPRESSION: 1. Persisting gas surrounding the gastric antrum and duodenal bulb which appears within the wall of the bowel as well as extraluminal gas. There is extraluminal gas present on comparison exam and collects in the gastrohepatic ligament. Of note, there is no evidence of leak of the oral contrast in the peritoneal space. This suggest there was a perforation which is now closed. 2. Several collection of fluid within the leaves of the mesentery without organization. Recommend follow-up CT scan to exclude early abscess formation 3. Dense bibasilar atelectasis and pleural effusions. 4. Low-density region in the LEFT kidney is favored artifact from adjacent high-density barium in the descending colon rather than pyelonephritis. Electronically Signed   By: Genevive Bi M.D.   On: 12/04/2017 15:31   Ir Replc Duoden/jejuno Tube Percut W/fluoro  Result Date: 12/02/2017 INDICATION: 27 year old female with dysphagia. She has an indwelling percutaneous gastrojejunostomy tube, with a functional 24 French balloon retention jejunostomy. The catheter is damaged and she presents for replacement EXAM: IMAGE GUIDED EXCHANGE OF ENTERIC PERCUTANEOUS FEEDING TUBE MEDICATIONS: None ANESTHESIA/SEDATION: None CONTRAST:  Fifteen-administered into the gastric lumen. FLUOROSCOPY TIME:  Fluoroscopy Time: 0 minutes 54 seconds (2.0 mGy). COMPLICATIONS: None PROCEDURE: Informed written consent was obtained from the patient after a thorough  discussion of the procedural risks, benefits and alternatives. All questions were addressed. Maximal Sterile Barrier Technique was utilized including caps, mask, sterile gowns, sterile gloves, sterile drape, hand hygiene and skin antiseptic. A timeout was performed prior to the initiation of the procedure. The patient is positioned supine position on the fluoroscopy table. The upper abdomen was prepped and draped in the usual sterile fashion including the indwelling tube. Contrast was infused confirming location within the proximal jejunum. Glidewire was advanced through the catheter and then the catheter was cut in order to decompress the balloon retention. Stiff glidewire was passed without difficulty and the catheter was removed from the stiff Glidewire. A new 24 French balloon retention jejunostomy was placed over the Glidewire without difficulty. Wire was removed, balloon was inflated and contrast confirmed location. Patient tolerated the procedure well and remained hemodynamically stable throughout. No complications were encountered and no significant blood loss. IMPRESSION: Status post routine exchange of percutaneous enteric feeding tube, a 24 French balloon retention jejunostomy with gastro J approach. Signed, Yvone Neu. Reyne Dumas, RPVI [facility] Radiology Electronically Signed   By: Gilmer Mor D.O.   On: 12/02/2017 10:18   Ir Cm  Inj Any Colonic Tube W/fluoro  Result Date: 12/14/2017 INDICATION: Fever, sepsis, tachycardia. Single-lumen gastrojejunostomy tube exchange yesterday due to external catheter damage. CT demonstrated epigastric loculated gas of uncertain etiology. EXAM: GI TUBE INJECTION MEDICATIONS: None indicated ANESTHESIA/SEDATION: None required CONTRAST:  70 mL Isovue-administered into the gastric in small bowel lumen. PROCEDURE: Informed written consent was obtained from the parent after a thorough discussion of the procedural risks,  benefits and alternatives. All questions were addressed. Maximal Sterile Barrier Technique was utilized including caps, mask, sterile gowns, sterile gloves, sterile drape, hand hygiene and skin antiseptic. A timeout was performed prior to the initiation of the procedure. Fluoroscopic inspection demonstrated appropriate position of the single-lumen gastrojejunostomy catheter. Injection confirmed catheter patency without leak, tip in the decompressed proximal jejunum. The catheter was withdrawn over an angled stiff glidewire. Injection demonstrated appropriate catheter course through the decompressed stomach. No extravasation was identified. The tube is readvanced and the retention balloon inflated in the gastric lumen with 10 mL of dilute contrast under fluoroscopy. The external bumper was applied. The catheter was flushed. The patient tolerated the procedure well. FLUOROSCOPY TIME:  2 minutes 54 seconds; 13 mGy COMPLICATIONS: None immediate. IMPRESSION: 1. No evidence of catheter malposition, malfunction, or extraluminal contrast extravasation. Okay for routine use. Electronically Signed   By: Corlis Leak M.D.   On: 12/06/2017 10:28   Dg Chest Port 1 View  Result Date: 12/15/2017 CLINICAL DATA:  Respiratory failure EXAM: PORTABLE CHEST 1 VIEW COMPARISON:  12/14/2017 FINDINGS: Endotracheal tube, right PICC line and NG tube remain in place, unchanged. Low lung volumes. Improving airspace disease at the right lung base. No visible effusions or confluent opacities. IMPRESSION: Very low lung volumes. No confluent opacities. Improved airspace disease at the right lung base. Electronically Signed   By: Charlett Nose M.D.   On: 12/15/2017 07:52   Dg Chest Port 1 View  Result Date: 12/14/2017 CLINICAL DATA:  Acute respiratory failure EXAM: PORTABLE CHEST 1 VIEW COMPARISON:  12/13/2017 FINDINGS: Endotracheal tube, PICC line and central venous line are unchanged. NG tube unchanged. Near complete opacification of the  RIGHT hemithorax with only a small amount of aerated lung at the RIGHT apex. No significant change from comparison exam. LEFT lung is clear. IMPRESSION: 1. Stable support apparatus. 2. No significant interval change. 3. Near complete opacification of the RIGHT hemithorax with small amount of aerated lung at the RIGHT apex Electronically Signed   By: Genevive Bi M.D.   On: 12/14/2017 07:38   Dg Chest Port 1 View  Result Date: 12/13/2017 CLINICAL DATA:  Fever. EXAM: PORTABLE CHEST 1 VIEW COMPARISON:  12/12/2017. FINDINGS: Patient is rotated. Endotracheal tube terminates 2 cm above the carina. Nasogastric tube terminates in the stomach. Shunt catheter overlies the left chest. Right PICC tip is in the region of the brachiocephalic vein junction. Heart size is grossly stable. Lungs are low in volume with right basilar collapse/consolidation adjacent to an elevated right hemidiaphragm. Probable right pleural effusion. Mild left basilar atelectasis. IMPRESSION: 1. Right basilar collapse/consolidation, adjacent to an elevated right hemidiaphragm. Pneumonia cannot be excluded. 2. Probable right pleural effusion. 3. Left basilar atelectasis. Electronically Signed   By: Leanna Battles M.D.   On: 12/13/2017 08:23   Dg Chest Port 1 View  Result Date: 12/12/2017 CLINICAL DATA:  Check endotracheal tube placement EXAM: PORTABLE CHEST 1 VIEW COMPARISON:  12/10/2017 FINDINGS: Cardiac shadow is stable. Endotracheal tube and nasogastric catheter are again seen and stable. Ventricular shunt is noted. Right-sided PICC line is  noted coursing into the left innominate vein but stable. Bilateral pleural effusions right greater than left are noted. Right basilar infiltrate has increased slightly in the interval from the prior exam. IMPRESSION: Tubes and lines as described. Slight increase in right basilar atelectasis. Electronically Signed   By: Alcide Clever M.D.   On: 12/12/2017 07:38   Dg Chest Port 1 View  Result Date:  12/10/2017 CLINICAL DATA:  Respiratory failure EXAM: PORTABLE CHEST 1 VIEW COMPARISON:  12/09/2017 FINDINGS: Endotracheal tube and nasogastric catheter are again noted and stable. Shunt catheter is noted on the left. Right-sided pleural effusion and elevation the right hemidiaphragm is again noted and stable. Small left pleural effusion is seen as well. Mild interstitial opacities are again identified and stable. IMPRESSION: Given some technical variations in the imaging the overall appearance is stable. Electronically Signed   By: Alcide Clever M.D.   On: 12/10/2017 11:01   Dg Chest Port 1 View  Result Date: 12/09/2017 CLINICAL DATA:  Intubation.  Respiratory failure. EXAM: PORTABLE CHEST 1 VIEW COMPARISON:  Chest x-ray 12/07/2017.  CT 12/06/2017. FINDINGS: Endotracheal tube, NG tube, right PICC line in unchanged position. Heart size unchanged. Diffuse progressive bilateral pulmonary infiltrates and bilateral pleural effusions. CHF could present in this fashion. Bilateral pneumonia cannot be excluded. Oral contrast in the colon. IMPRESSION: 1.  Lines and tubes in stable position. 2. Progressive diffuse bilateral dense pulmonary infiltrates and bilateral pleural effusions. CHF could present this fashion. Bilateral pneumonia cannot be excluded. Electronically Signed   By: Maisie Fus  Register   On: 12/09/2017 10:22   Dg Chest Port 1 View  Result Date: 12/07/2017 CLINICAL DATA:  Respiratory failure EXAM: PORTABLE CHEST 1 VIEW COMPARISON:  CT chest dated 12/06/2017 FINDINGS: Low lung volumes. Mild patchy opacities in the right upper lobe and bilateral lower lobes, better evaluated on prior CT. Small bilateral pleural effusions. No pneumothorax. Endotracheal tube terminates 10 mm above the carina. Enteric tube courses into the stomach. Defibrillator pads overlying the left hemithorax. Contrast within colon in the upper abdomen. IMPRESSION: Low lung volumes. Multifocal patchy opacities, better evaluated on recent CT.  Small bilateral pleural effusions. Endotracheal tube terminates 10 mm above the carina. Enteric tube courses into the stomach. Electronically Signed   By: Charline Bills M.D.   On: 12/07/2017 07:38   Dg Chest Port 1 View  Result Date: 12/06/2017 CLINICAL DATA:  Endotracheal tube placement. EXAM: PORTABLE CHEST 1 VIEW COMPARISON:  Radiographs 11/29/2017 and 04/24/2017. Abdominal CT 12/04/2017. FINDINGS: 1025 hours. Endotracheal tube extends into the right mainstem bronchus and should be retracted approximately 4 cm. Enteric tube projects below the diaphragm, tip not visualized. Ventricular peritoneal shunt catheter overlies the chest and upper abdomen. There is a right subclavian central venous catheter projecting to the level of the lower SVC. There is complete collapse of the left lung. There is mild perihilar atelectasis on the right. No significant pleural effusion or pneumothorax. IMPRESSION: 1. Right mainstem bronchus intubation with resulting complete left lung collapse. The endotracheal tube should be withdrawn approximately 4 cm. 2. Central line and enteric tube appear adequately positioned. 3. These results were called by telephone at the time of interpretation on 12/06/2017 at 10:55 am to the patient's nurse Junious Dresser, who verbally acknowledged these results. Transmission of results delayed by transfer of patient to a different ward and 3 telephone calls required before phone was answered. Electronically Signed   By: Carey Bullocks M.D.   On: 12/06/2017 10:58   Dg Chest [facility]  Result Date: 11/22/2017 CLINICAL DATA:  Initial evaluation for acute fever. EXAM: PORTABLE CHEST 1 VIEW COMPARISON:  Prior radiograph from 04/24/2017. FINDINGS: Patient markedly rotated to the right. Allowing for rotation level cardiac and mediastinal silhouettes are grossly stable in size and contour, and remain within normal limits. Lungs are hypoinflated. Patchy bibasilar opacities, right greater than left, could  reflect atelectasis or infiltrates. No pulmonary edema or pleural effusion. No pneumothorax. No acute osseous finding. Percutaneous G-tube partially visualized overlying the upper abdomen. Abnormality. IMPRESSION: Shallow lung inflation with patchy right greater than left bibasilar opacities, which could reflect atelectasis or infiltrates. Electronically Signed   By: Rise Mu M.D.   On: 11/26/2017 03:46   [place] Ekg Site Rite  Result Date: 12/02/2017 If Site Rite image not attached, placement could not be confirmed due to current cardiac rhythm.   Microbiology No results found for this or any previous visit (from the past 240 hour(s)).  Lab Basic Metabolic Panel: No results for input(s): NA, K, CL, CO2, GLUCOSE, BUN, CREATININE, CALCIUM, MG, PHOS in the last 168 hours. Liver Function Tests: No results for input(s): AST, ALT, ALKPHOS, BILITOT, PROT, ALBUMIN in the last 168 hours. No results for input(s): LIPASE, AMYLASE in the last 168 hours. No results for input(s): AMMONIA in the last 168 hours. CBC: No results for input(s): WBC, NEUTROABS, HGB, HCT, MCV, PLT in the last 168 hours. Cardiac Enzymes: No results for input(s): CKTOTAL, CKMB, CKMBINDEX, TROPONINI in the last 168 hours. Sepsis Labs: No results for input(s): PROCALCITON, WBC, LATICACIDVEN in the last 168 hours.  Procedures/Operations     Elena Davia 12/30/2017, 4:43 PM

## 2018-01-16 DEATH — deceased

## 2019-10-08 IMAGING — CT CT ABD-PELV W/ CM
2 of 4 series · 15 of 46 positions shown, 17 images · IV contrast (Omni 300)
Comparison: G-tube evaluation 12/03/2017, CT 12/03/2017

CLINICAL DATA: Fever.  Recent G  tube exchange

EXAM:
CT ABDOMEN AND PELVIS WITH CONTRAST
TECHNIQUE: Multidetector CT imaging of the abdomen and pelvis was performed
using the standard protocol following bolus administration of
intravenous contrast.
CONTRAST:  <See Chart> XWUHQX-222 IOPAMIDOL (XWUHQX-222) INJECTION
61%, <See Chart> OMNIPAQUE IOHEXOL 300 MG/ML SOLN

[Series 3: a/p w/ 5mm · axial · 0.83mm/px · z∈[+660,+1035]mm · 12 of 89 slices shown, 14 images]
[im 7/89  soft-tissue]
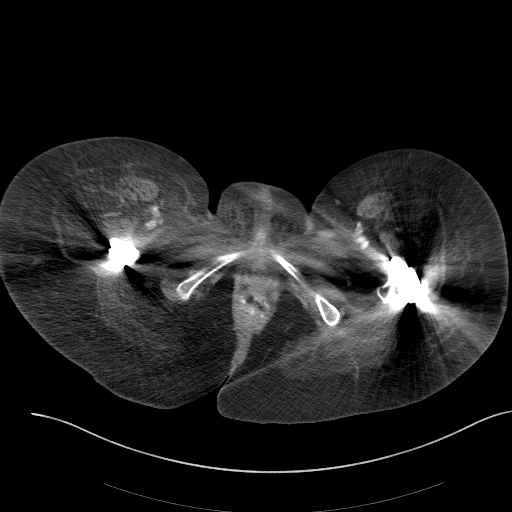
[im 7/89  bone]
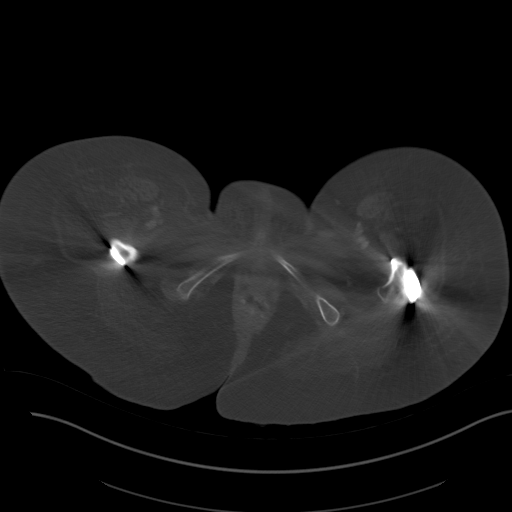
[im 14/89  soft-tissue]
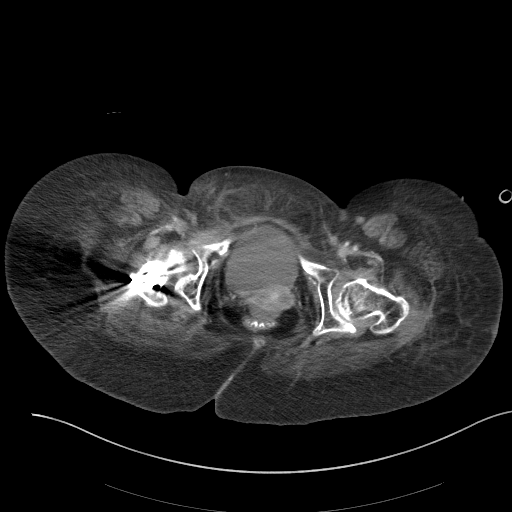
[im 21/89  soft-tissue]
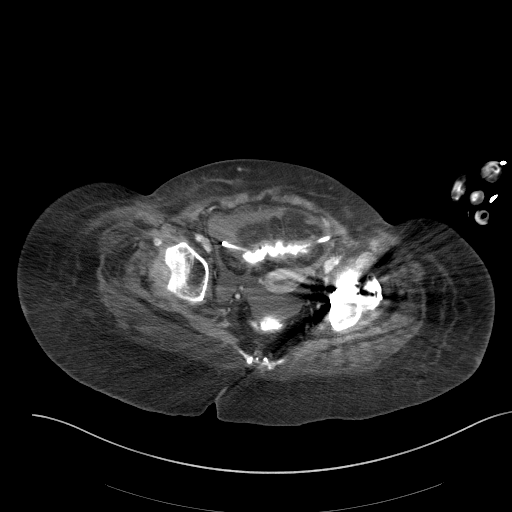
[im 28/89  soft-tissue]
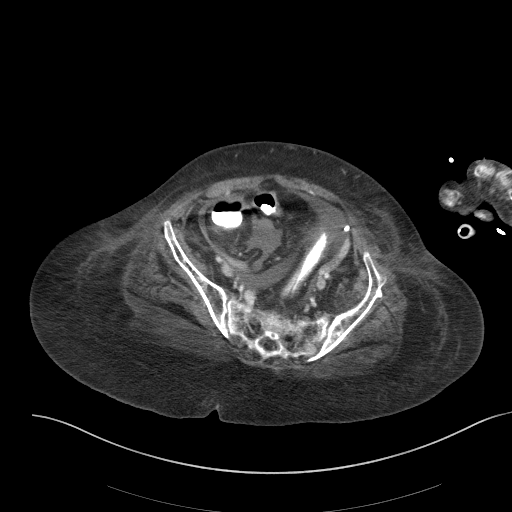
[im 34/89  soft-tissue]
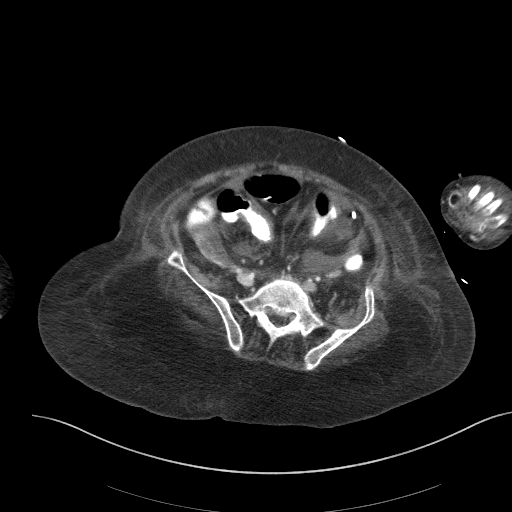
[im 41/89  soft-tissue]
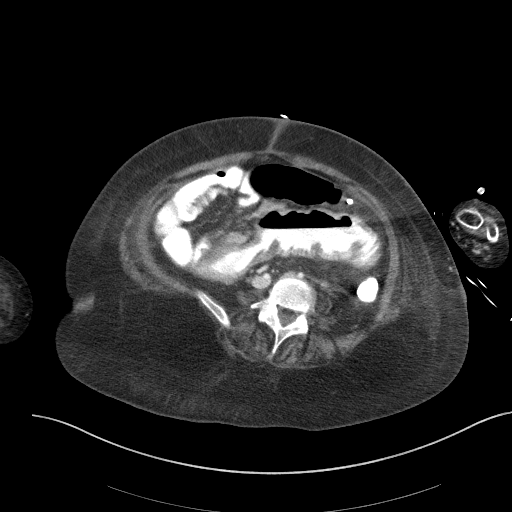
[im 48/89  soft-tissue]
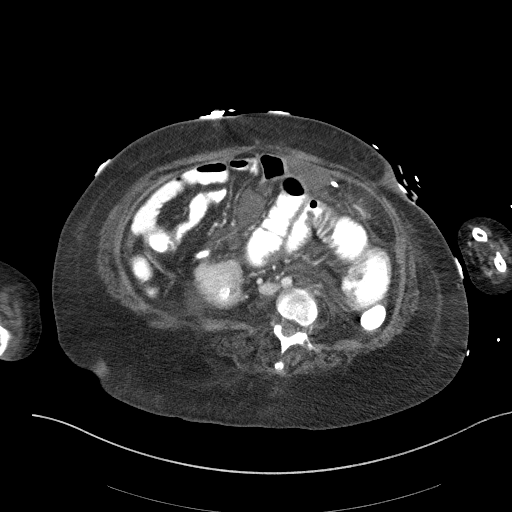
[im 55/89  soft-tissue]
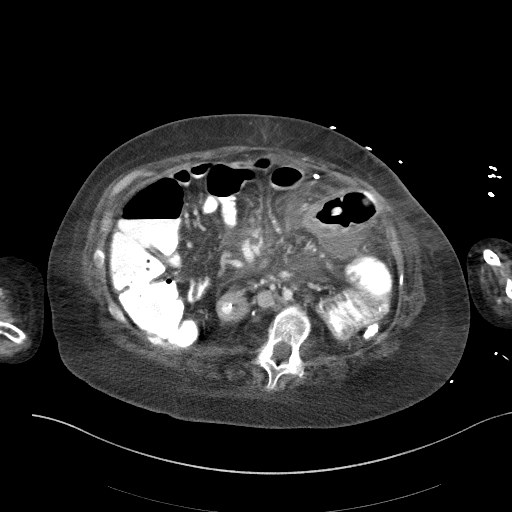
[im 61/89  soft-tissue]
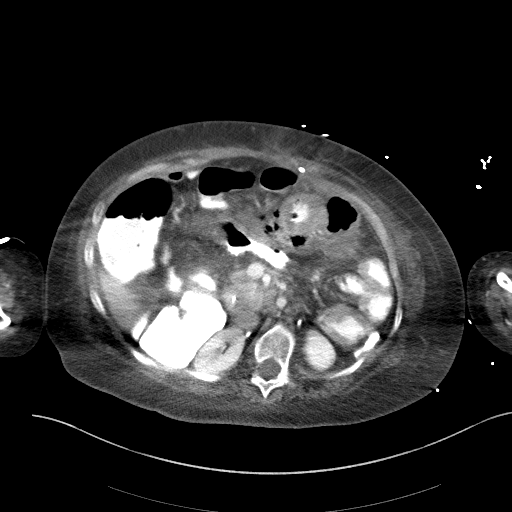
[im 61/89  bone]
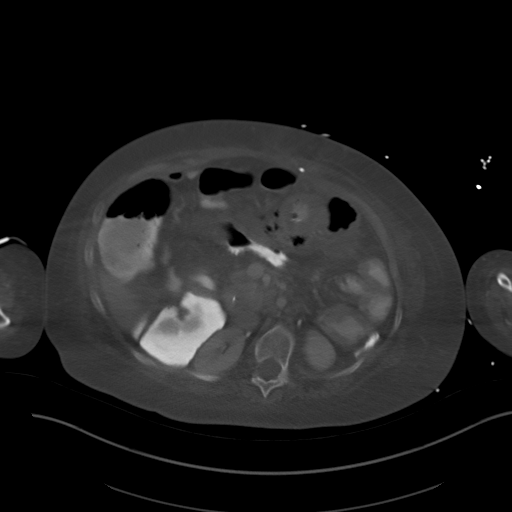
[im 68/89  soft-tissue]
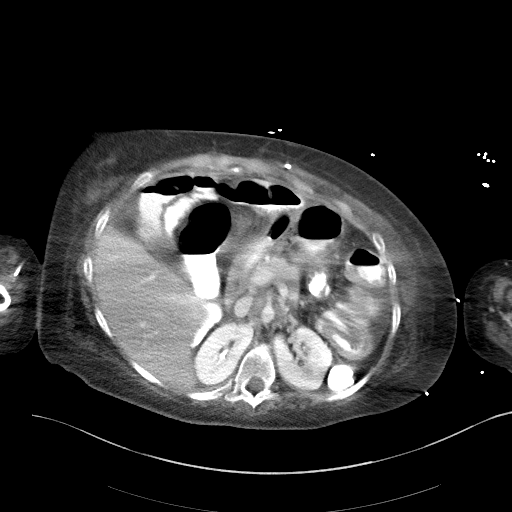
[im 75/89  soft-tissue]
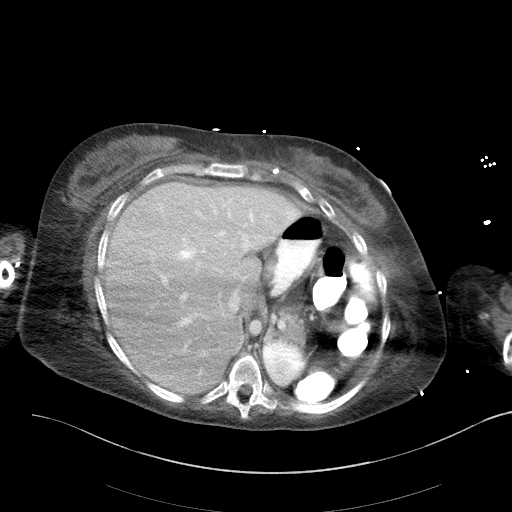
[im 82/89  soft-tissue]
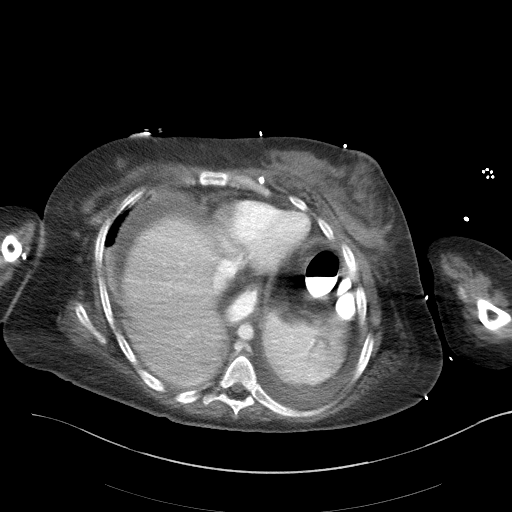

[Series 6: a/p w/ cor · coronal · 0.79mm/px · 3 of 151 slices shown]
[im 51/151  soft-tissue]
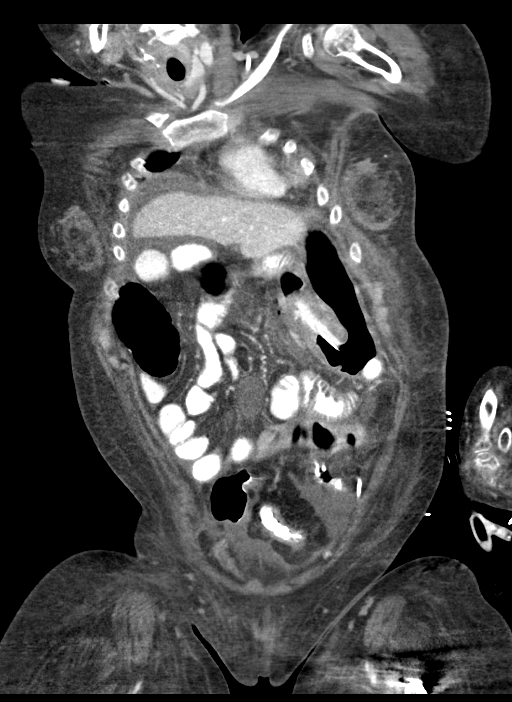
[im 67/151  soft-tissue]
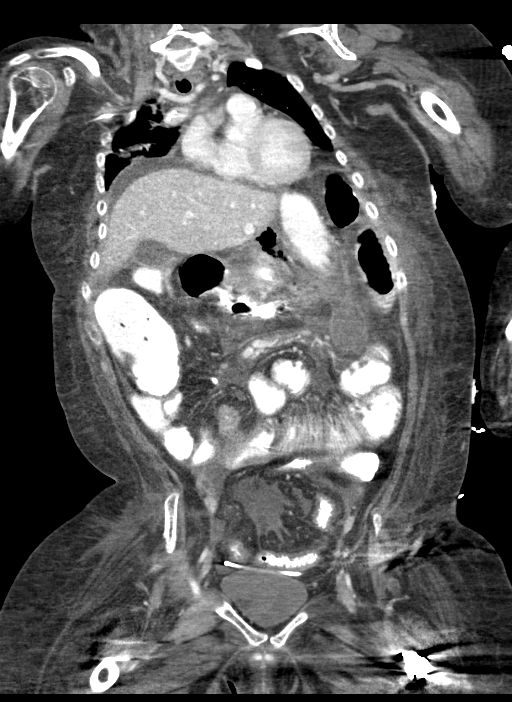
[im 84/151  soft-tissue]
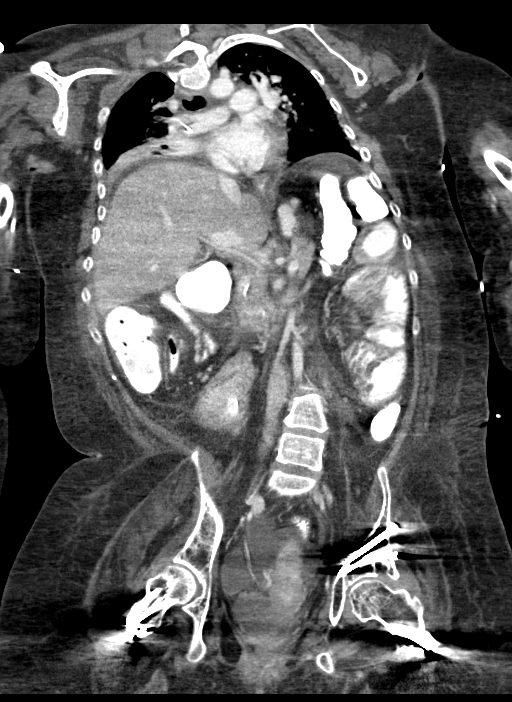

[15 of 46 positions shown; findings below may reference images not displayed]

FINDINGS: Lower chest: Bilateral pleural effusions and basilar atelectasis.
Atelectasis dense.

Hepatobiliary: No focal hepatic lesion. No biliary duct dilatation.
Gallbladder normal. Common bile duct normal.

Pancreas: Grossly normal.  No inflammation or fluid collections.

Spleen: Normal spleen

Adrenals/urinary tract: Adrenal glands normal. There is
hypoattenuation within the cortex of the LEFT kidney (image [DATE]
which may be artifact from the adjacent high density barium within
the bowel.

Ureters and bladder normal.

Stomach/Bowel: Percutaneous gastrostomy tube with bulb within the
gastric lumen. The J arm extension into the proximal small bowel.

There is persistent gas surrounding the junction of the gastric
antrum and duodenal bulb (image [id number] of series 3). Similar to
comparison CT exam. There is extraluminal gas position between the
stomach and the liver in the gastrohepatic ligament (image [DATE]).
This is also similar to comparison exam. Of note, there is no
evidence of leak of the injected CT contrast.

There is a moderate volume of free fluid in mesentery and pelvis
which is low-density.

The contrast injected contrast flows distally in the small bowel.
There is some thickening of the second portion the duodenum without
obstruction. Contrast flows the RIGHT colon and LEFT colon and
rectum.

.

Vascular/Lymphatic: Abdominal aortic normal caliber. No
retroperitoneal periportal lymphadenopathy.

Musculoskeletal: No aggressive osseous lesion
IMPRESSION: 1. Persisting gas surrounding the gastric antrum and duodenal bulb
which appears within the wall of the bowel as well as extraluminal
gas. There is extraluminal gas present on comparison exam and
collects in the gastrohepatic ligament. Of note, there is no
evidence of leak of the oral contrast in the peritoneal space. This
suggest there was a perforation which is now closed.
2. Several collection of fluid within the leaves of the mesentery
without organization. Recommend follow-up CT scan to exclude early
abscess formation
3. Dense bibasilar atelectasis and pleural effusions.
4. Low-density region in the LEFT kidney is favored artifact from
adjacent high-density barium in the descending colon rather than
pyelonephritis.
